# Patient Record
Sex: Female | Born: 1935 | Race: White | Hispanic: No | Smoking: Former smoker
Health system: Southern US, Community
[De-identification: ages and names within clinical notes are randomized; demographics above are authoritative.]

## PROBLEM LIST (undated history)

## (undated) DIAGNOSIS — K219 Gastro-esophageal reflux disease without esophagitis: Secondary | ICD-10-CM

## (undated) DIAGNOSIS — D494 Neoplasm of unspecified behavior of bladder: Secondary | ICD-10-CM

## (undated) DIAGNOSIS — R0789 Other chest pain: Secondary | ICD-10-CM

## (undated) DIAGNOSIS — R011 Cardiac murmur, unspecified: Secondary | ICD-10-CM

## (undated) DIAGNOSIS — I10 Essential (primary) hypertension: Secondary | ICD-10-CM

## (undated) DIAGNOSIS — H906 Mixed conductive and sensorineural hearing loss, bilateral: Secondary | ICD-10-CM

## (undated) DIAGNOSIS — I679 Cerebrovascular disease, unspecified: Secondary | ICD-10-CM

## (undated) DIAGNOSIS — E039 Hypothyroidism, unspecified: Secondary | ICD-10-CM

## (undated) DIAGNOSIS — J320 Chronic maxillary sinusitis: Secondary | ICD-10-CM

## (undated) DIAGNOSIS — I4891 Unspecified atrial fibrillation: Secondary | ICD-10-CM

## (undated) DIAGNOSIS — R110 Nausea: Secondary | ICD-10-CM

## (undated) DIAGNOSIS — C679 Malignant neoplasm of bladder, unspecified: Secondary | ICD-10-CM

## (undated) DIAGNOSIS — K589 Irritable bowel syndrome without diarrhea: Secondary | ICD-10-CM

## (undated) DIAGNOSIS — I350 Nonrheumatic aortic (valve) stenosis: Secondary | ICD-10-CM

## (undated) DIAGNOSIS — M129 Arthropathy, unspecified: Secondary | ICD-10-CM

## (undated) DIAGNOSIS — M1710 Unilateral primary osteoarthritis, unspecified knee: Secondary | ICD-10-CM

## (undated) DIAGNOSIS — R42 Dizziness and giddiness: Secondary | ICD-10-CM

## (undated) HISTORY — DX: Nonrheumatic aortic (valve) stenosis: I35.0

## (undated) HISTORY — DX: Mixed conductive and sensorineural hearing loss, bilateral: H90.6

## (undated) HISTORY — PX: EYE SURGERY: SHX253

## (undated) HISTORY — DX: Essential (primary) hypertension: I10

## (undated) HISTORY — PX: TONSILLECTOMY AND ADENOIDECTOMY: SUR1326

## (undated) HISTORY — DX: Chronic maxillary sinusitis: J32.0

## (undated) HISTORY — DX: Neoplasm of unspecified behavior of bladder: D49.4

## (undated) HISTORY — DX: Cardiac murmur, unspecified: R01.1

## (undated) HISTORY — DX: Unilateral primary osteoarthritis, unspecified knee: M17.10

## (undated) HISTORY — PX: BUNIONECTOMY: SHX129

## (undated) HISTORY — DX: Malignant neoplasm of bladder, unspecified: C67.9

## (undated) HISTORY — DX: Cerebrovascular disease, unspecified: I67.9

## (undated) HISTORY — PX: BLADDER TUMOR EXCISION: SHX238

## (undated) HISTORY — PX: CATARACT EXTRACTION: SUR2

## (undated) HISTORY — DX: Hypothyroidism, unspecified: E03.9

## (undated) HISTORY — DX: Arthropathy, unspecified: M12.9

## (undated) HISTORY — PX: BELPHAROPTOSIS REPAIR: SHX369

## (undated) HISTORY — DX: Irritable bowel syndrome without diarrhea: K58.9

## (undated) HISTORY — DX: Nausea: R11.0

## (undated) HISTORY — DX: Dizziness and giddiness: R42

## (undated) HISTORY — DX: Other chest pain: R07.89

---

## 1997-12-24 ENCOUNTER — Other Ambulatory Visit: Admission: RE | Admit: 1997-12-24 | Discharge: 1997-12-24 | Payer: Self-pay | Admitting: Obstetrics and Gynecology

## 1999-07-17 ENCOUNTER — Other Ambulatory Visit: Admission: RE | Admit: 1999-07-17 | Discharge: 1999-07-17 | Payer: Self-pay | Admitting: Obstetrics and Gynecology

## 2000-11-04 ENCOUNTER — Other Ambulatory Visit: Admission: RE | Admit: 2000-11-04 | Discharge: 2000-11-04 | Payer: Self-pay | Admitting: Obstetrics and Gynecology

## 2001-11-14 ENCOUNTER — Other Ambulatory Visit: Admission: RE | Admit: 2001-11-14 | Discharge: 2001-11-14 | Payer: Self-pay | Admitting: Obstetrics and Gynecology

## 2002-11-16 ENCOUNTER — Other Ambulatory Visit: Admission: RE | Admit: 2002-11-16 | Discharge: 2002-11-16 | Payer: Self-pay | Admitting: Obstetrics and Gynecology

## 2004-11-29 ENCOUNTER — Encounter: Payer: Self-pay | Admitting: Internal Medicine

## 2005-03-21 ENCOUNTER — Ambulatory Visit: Payer: Self-pay | Admitting: Internal Medicine

## 2005-06-27 ENCOUNTER — Ambulatory Visit: Payer: Self-pay | Admitting: Internal Medicine

## 2005-08-03 ENCOUNTER — Ambulatory Visit: Payer: Self-pay | Admitting: Internal Medicine

## 2005-11-26 ENCOUNTER — Ambulatory Visit: Payer: Self-pay | Admitting: Internal Medicine

## 2005-11-28 ENCOUNTER — Ambulatory Visit: Payer: Self-pay | Admitting: Internal Medicine

## 2006-03-01 ENCOUNTER — Ambulatory Visit: Payer: Self-pay | Admitting: Internal Medicine

## 2006-03-13 ENCOUNTER — Ambulatory Visit: Payer: Self-pay | Admitting: Internal Medicine

## 2006-03-25 ENCOUNTER — Ambulatory Visit: Payer: Self-pay | Admitting: Internal Medicine

## 2006-06-21 ENCOUNTER — Ambulatory Visit: Payer: Self-pay | Admitting: Internal Medicine

## 2006-08-08 ENCOUNTER — Encounter (INDEPENDENT_AMBULATORY_CARE_PROVIDER_SITE_OTHER): Payer: Self-pay | Admitting: *Deleted

## 2006-08-08 ENCOUNTER — Ambulatory Visit (HOSPITAL_BASED_OUTPATIENT_CLINIC_OR_DEPARTMENT_OTHER): Admission: RE | Admit: 2006-08-08 | Discharge: 2006-08-08 | Payer: Self-pay | Admitting: Urology

## 2006-08-16 ENCOUNTER — Ambulatory Visit: Payer: Self-pay | Admitting: Internal Medicine

## 2006-08-21 ENCOUNTER — Ambulatory Visit: Payer: Self-pay | Admitting: Internal Medicine

## 2006-08-28 ENCOUNTER — Ambulatory Visit: Payer: Self-pay | Admitting: Internal Medicine

## 2006-09-04 ENCOUNTER — Ambulatory Visit: Payer: Self-pay | Admitting: Internal Medicine

## 2006-10-10 ENCOUNTER — Ambulatory Visit: Payer: Self-pay | Admitting: Internal Medicine

## 2006-11-11 ENCOUNTER — Ambulatory Visit (HOSPITAL_COMMUNITY): Admission: RE | Admit: 2006-11-11 | Discharge: 2006-11-11 | Payer: Self-pay | Admitting: Urology

## 2007-01-07 DIAGNOSIS — I10 Essential (primary) hypertension: Secondary | ICD-10-CM

## 2007-01-07 DIAGNOSIS — E039 Hypothyroidism, unspecified: Secondary | ICD-10-CM

## 2007-01-07 HISTORY — DX: Essential (primary) hypertension: I10

## 2007-01-07 HISTORY — DX: Hypothyroidism, unspecified: E03.9

## 2007-04-25 ENCOUNTER — Ambulatory Visit: Payer: Self-pay | Admitting: Internal Medicine

## 2007-04-25 DIAGNOSIS — IMO0002 Reserved for concepts with insufficient information to code with codable children: Secondary | ICD-10-CM

## 2007-04-25 DIAGNOSIS — M1710 Unilateral primary osteoarthritis, unspecified knee: Secondary | ICD-10-CM

## 2007-04-25 HISTORY — DX: Reserved for concepts with insufficient information to code with codable children: IMO0002

## 2007-04-25 LAB — CONVERTED CEMR LAB
BUN: 20 mg/dL (ref 6–23)
CO2: 32 meq/L (ref 19–32)
Calcium: 9.8 mg/dL (ref 8.4–10.5)
Chloride: 106 meq/L (ref 96–112)
Creatinine, Ser: 0.9 mg/dL (ref 0.4–1.2)
GFR calc Af Amer: 76 mL/min
GFR calc non Af Amer: 63 mL/min
Glucose, Bld: 91 mg/dL (ref 70–99)
Potassium: 4 meq/L (ref 3.5–5.1)
Sodium: 144 meq/L (ref 135–145)
TSH: 0.12 microintl units/mL — ABNORMAL LOW (ref 0.35–5.50)

## 2007-10-24 ENCOUNTER — Ambulatory Visit: Payer: Self-pay | Admitting: Internal Medicine

## 2007-10-24 DIAGNOSIS — C679 Malignant neoplasm of bladder, unspecified: Secondary | ICD-10-CM | POA: Insufficient documentation

## 2007-10-24 HISTORY — DX: Malignant neoplasm of bladder, unspecified: C67.9

## 2007-10-27 LAB — CONVERTED CEMR LAB
BUN: 25 mg/dL — ABNORMAL HIGH (ref 6–23)
CO2: 35 meq/L — ABNORMAL HIGH (ref 19–32)
Chloride: 100 meq/L (ref 96–112)
GFR calc Af Amer: 76 mL/min
Sodium: 141 meq/L (ref 135–145)
TSH: 0.22 microintl units/mL — ABNORMAL LOW (ref 0.35–5.50)

## 2007-12-04 ENCOUNTER — Encounter: Payer: Self-pay | Admitting: Internal Medicine

## 2007-12-12 ENCOUNTER — Encounter: Payer: Self-pay | Admitting: Internal Medicine

## 2008-03-22 ENCOUNTER — Encounter: Payer: Self-pay | Admitting: Internal Medicine

## 2008-05-03 ENCOUNTER — Ambulatory Visit: Payer: Self-pay | Admitting: Internal Medicine

## 2008-05-03 DIAGNOSIS — K589 Irritable bowel syndrome without diarrhea: Secondary | ICD-10-CM

## 2008-05-03 DIAGNOSIS — K582 Mixed irritable bowel syndrome: Secondary | ICD-10-CM

## 2008-05-03 HISTORY — DX: Irritable bowel syndrome, unspecified: K58.9

## 2008-05-05 LAB — CONVERTED CEMR LAB
BUN: 23 mg/dL (ref 6–23)
CO2: 30 meq/L (ref 19–32)
GFR calc Af Amer: 67 mL/min
GFR calc non Af Amer: 56 mL/min
Glucose, Bld: 84 mg/dL (ref 70–99)

## 2008-06-02 ENCOUNTER — Ambulatory Visit: Payer: Self-pay | Admitting: Family Medicine

## 2008-06-09 ENCOUNTER — Ambulatory Visit: Payer: Self-pay | Admitting: Internal Medicine

## 2008-06-21 ENCOUNTER — Encounter: Payer: Self-pay | Admitting: Internal Medicine

## 2008-06-23 ENCOUNTER — Ambulatory Visit: Payer: Self-pay | Admitting: Internal Medicine

## 2008-06-25 ENCOUNTER — Telehealth: Payer: Self-pay | Admitting: Internal Medicine

## 2008-06-29 ENCOUNTER — Ambulatory Visit: Payer: Self-pay | Admitting: Internal Medicine

## 2008-07-07 ENCOUNTER — Ambulatory Visit: Payer: Self-pay | Admitting: Internal Medicine

## 2008-07-19 ENCOUNTER — Ambulatory Visit: Payer: Self-pay | Admitting: Internal Medicine

## 2008-08-16 ENCOUNTER — Ambulatory Visit: Payer: Self-pay | Admitting: Internal Medicine

## 2008-08-18 ENCOUNTER — Encounter: Payer: Self-pay | Admitting: Internal Medicine

## 2008-08-18 ENCOUNTER — Ambulatory Visit: Payer: Self-pay

## 2008-09-15 ENCOUNTER — Ambulatory Visit: Payer: Self-pay | Admitting: Internal Medicine

## 2008-09-17 ENCOUNTER — Ambulatory Visit: Payer: Self-pay | Admitting: Gastroenterology

## 2008-09-17 LAB — CONVERTED CEMR LAB: Tissue Transglutaminase Ab, IgA: 0.3 units (ref <7)

## 2008-09-20 ENCOUNTER — Encounter: Payer: Self-pay | Admitting: Gastroenterology

## 2008-09-21 LAB — CONVERTED CEMR LAB
Alkaline Phosphatase: 59 units/L (ref 39–117)
BUN: 29 mg/dL — ABNORMAL HIGH (ref 6–23)
Basophils Absolute: 0 10*3/uL (ref 0.0–0.1)
Basophils Relative: 0.2 % (ref 0.0–3.0)
CO2: 29 meq/L (ref 19–32)
GFR calc Af Amer: 76 mL/min
HCT: 36.2 % (ref 36.0–46.0)
MCV: 88.5 fL (ref 78.0–100.0)
Monocytes Absolute: 0.9 10*3/uL (ref 0.1–1.0)
Monocytes Relative: 6.2 % (ref 3.0–12.0)
Neutrophils Relative %: 79.2 % — ABNORMAL HIGH (ref 43.0–77.0)
RBC: 4.08 M/uL (ref 3.87–5.11)
Sed Rate: 29 mm/hr — ABNORMAL HIGH (ref 0–22)
Sodium: 140 meq/L (ref 135–145)
Total Protein: 6.4 g/dL (ref 6.0–8.3)
WBC: 14.3 10*3/uL — ABNORMAL HIGH (ref 4.5–10.5)

## 2008-11-01 ENCOUNTER — Ambulatory Visit: Payer: Self-pay | Admitting: Internal Medicine

## 2008-11-02 ENCOUNTER — Ambulatory Visit: Payer: Self-pay | Admitting: Gastroenterology

## 2008-11-03 LAB — CONVERTED CEMR LAB
BUN: 21 mg/dL (ref 6–23)
CO2: 35 meq/L — ABNORMAL HIGH (ref 19–32)
Calcium: 9.6 mg/dL (ref 8.4–10.5)
Creatinine, Ser: 0.9 mg/dL (ref 0.4–1.2)
GFR calc non Af Amer: 62.86 mL/min (ref >60)
Potassium: 3.6 meq/L (ref 3.5–5.1)

## 2008-12-22 ENCOUNTER — Encounter: Payer: Self-pay | Admitting: Internal Medicine

## 2009-05-02 ENCOUNTER — Ambulatory Visit: Payer: Self-pay | Admitting: Internal Medicine

## 2009-05-04 ENCOUNTER — Encounter: Payer: Self-pay | Admitting: Internal Medicine

## 2009-05-04 LAB — CONVERTED CEMR LAB
BUN: 22 mg/dL (ref 6–23)
GFR calc non Af Amer: 71.93 mL/min (ref >60)
Glucose, Bld: 92 mg/dL (ref 70–99)
TSH: 0.53 microintl units/mL (ref 0.35–5.50)

## 2009-06-20 ENCOUNTER — Ambulatory Visit: Payer: Self-pay | Admitting: Internal Medicine

## 2009-08-05 ENCOUNTER — Encounter: Payer: Self-pay | Admitting: Internal Medicine

## 2009-09-28 ENCOUNTER — Encounter: Payer: Self-pay | Admitting: Internal Medicine

## 2009-09-28 DIAGNOSIS — H906 Mixed conductive and sensorineural hearing loss, bilateral: Secondary | ICD-10-CM | POA: Insufficient documentation

## 2009-09-28 HISTORY — DX: Mixed conductive and sensorineural hearing loss, bilateral: H90.6

## 2009-10-26 ENCOUNTER — Ambulatory Visit: Payer: Self-pay | Admitting: Internal Medicine

## 2009-10-26 DIAGNOSIS — M129 Arthropathy, unspecified: Secondary | ICD-10-CM

## 2009-10-26 HISTORY — DX: Arthropathy, unspecified: M12.9

## 2009-10-27 LAB — CONVERTED CEMR LAB
BUN: 17 mg/dL (ref 6–23)
CO2: 33 meq/L — ABNORMAL HIGH (ref 19–32)
Chloride: 102 meq/L (ref 96–112)
Creatinine, Ser: 0.8 mg/dL (ref 0.4–1.2)
Potassium: 4 meq/L (ref 3.5–5.1)
TSH: 0.44 microintl units/mL (ref 0.35–5.50)

## 2009-11-04 ENCOUNTER — Telehealth: Payer: Self-pay | Admitting: Internal Medicine

## 2009-11-04 ENCOUNTER — Encounter: Payer: Self-pay | Admitting: Internal Medicine

## 2009-11-18 ENCOUNTER — Ambulatory Visit: Payer: Self-pay | Admitting: Gastroenterology

## 2009-11-21 LAB — CONVERTED CEMR LAB
Basophils Relative: 0.4 % (ref 0.0–3.0)
Eosinophils Relative: 0.9 % (ref 0.0–5.0)
Lymphocytes Relative: 40 % (ref 12.0–46.0)
MCV: 90.3 fL (ref 78.0–100.0)
Monocytes Relative: 8.4 % (ref 3.0–12.0)
Neutro Abs: 4.3 10*3/uL (ref 1.4–7.7)
Neutrophils Relative %: 50.3 % (ref 43.0–77.0)
RBC: 3.84 M/uL — ABNORMAL LOW (ref 3.87–5.11)
WBC: 8.5 10*3/uL (ref 4.5–10.5)

## 2009-12-08 ENCOUNTER — Telehealth: Payer: Self-pay | Admitting: Internal Medicine

## 2010-03-14 ENCOUNTER — Telehealth: Payer: Self-pay | Admitting: Gastroenterology

## 2010-03-23 ENCOUNTER — Telehealth: Payer: Self-pay | Admitting: Internal Medicine

## 2010-03-24 ENCOUNTER — Encounter: Payer: Self-pay | Admitting: Internal Medicine

## 2010-04-11 ENCOUNTER — Encounter: Payer: Self-pay | Admitting: Internal Medicine

## 2010-04-26 ENCOUNTER — Ambulatory Visit: Payer: Self-pay | Admitting: Internal Medicine

## 2010-05-09 ENCOUNTER — Ambulatory Visit: Payer: Self-pay | Admitting: Internal Medicine

## 2010-05-09 DIAGNOSIS — M549 Dorsalgia, unspecified: Secondary | ICD-10-CM | POA: Insufficient documentation

## 2010-05-11 ENCOUNTER — Telehealth: Payer: Self-pay | Admitting: Internal Medicine

## 2010-07-11 ENCOUNTER — Telehealth: Payer: Self-pay | Admitting: Internal Medicine

## 2010-07-18 ENCOUNTER — Ambulatory Visit
Admission: RE | Admit: 2010-07-18 | Discharge: 2010-07-18 | Payer: Self-pay | Source: Home / Self Care | Attending: Gastroenterology | Admitting: Gastroenterology

## 2010-07-24 ENCOUNTER — Encounter: Payer: Self-pay | Admitting: Internal Medicine

## 2010-08-08 NOTE — Letter (Signed)
Summary: [facility]  [facility]   Imported By: Maryln Gottron 08/11/2009 14:29:32  _____________________________________________________________________  External Attachment:    Type:   Image     Comment:   External Document

## 2010-08-08 NOTE — Progress Notes (Signed)
Summary: IBS meds   Phone Note Call from Patient Call back at Home Phone [phone]   Caller: Patient Call For: Dr. Christella Hartigan Reason for Call: Talk to Nurse Summary of Call: pt having eye surgery on the 15th and says she will need stronger medication for IBS on that particular day Initial call taken by: Vallarie Mare,  March 14, 2010 10:03 AM  Follow-up for Phone Call        pt was advised to call her Eye surgeon and make them aware that she is taking Immodium and see if it is ok for her to take the Immodium before bed the night before and if it is ok for her to continue taking it after.  She will call and make them aware that she does take it. Follow-up by: Chales Abrahams CMA Duncan Dull),  March 14, 2010 10:14 AM  Additional Follow-up for Phone Call Additional follow up Details #1::        ok Additional Follow-up by: Rachael Fee MD,  March 14, 2010 10:17 AM

## 2010-08-08 NOTE — Progress Notes (Signed)
Summary: foot swelling  Phone Note Call from Patient Call back at Home Phone [phone]   Caller: Patient--live Call For: Birdie Sons MD Summary of Call: change of BP med last visit.  BP 4/25 124/72, 4/29 138/66--noted feet swollen last night after dinner; R>L with ankle not showing  Told to continue to monitor and call back if swelling returns. Initial call taken by: Gladis Riffle, RN,  November 04, 2009 10:01 AM  Follow-up for Phone Call        dr Karyssa Amaral agreed. Follow-up by: Gladis Riffle, RN,  November 04, 2009 10:01 AM

## 2010-08-08 NOTE — Miscellaneous (Signed)
Summary: Orders Update   Clinical Lists Changes  Problems: Added new problem of MIXED HEARING LOSS BILATERAL (ICD-389.22) Orders: Added new Referral order of Misc. Referral (Misc. Ref) - Signed

## 2010-08-08 NOTE — Assessment & Plan Note (Signed)
Summary: 6 month rov/njr   Vital Signs:  Patient profile:   77 year old female Weight:      174 pounds BMI:     31.94 Temp:     98.6 degrees F oral Pulse rhythm:   regular Resp:     16 per minute BP sitting:   140 / 76  Vitals Entered By: Lynann Beaver CMA (April 26, 2010 10:53 AM) CC: rov Is Patient Diabetic? No Pain Assessment Patient in pain? no        Primary Care Yordy Matton:  Birdie Sons, MD  CC:  rov.  History of Present Illness:  Follow-Up Visit      This is an 77 year old woman who presents for Follow-up visit.  The patient denies chest pain and palpitations.  Since the last visit the patient notes no new problems or concerns and being seen by a specialist.  The patient reports taking meds as prescribed and not monitoring BP.  When questioned about possible medication side effects, the patient notes none.   Seeing dr Christella Hartigan for chronic diarrhea  All other systems reviewed and were negative except for chronic knee pain  Current Problems (verified): 1)  Unspecified Arthropathy Multiple Sites  (ICD-716.99) 2)  Mixed Hearing Loss Bilateral  (ICD-389.22) 3)  Ibs  (ICD-564.1) 4)  Carcinoma, Bladder, Transitional Cell  (ICD-188.9) 5)  Osteoarthritis, Knee  (ICD-715.96) 6)  Hypertension  (ICD-401.9) 7)  Hypothyroidism  (ICD-244.9)  Current Medications (verified): 1)  Multivitamins   Tabs (Multiple Vitamin) .... One By Mouth Borchardt 2)  Tylenol Extra Strength 500 Mg  Tabs (Acetaminophen) .... 2  By Mouth Diven Plus One At Bedtime 3)  Levothyroxine Sodium 75 Mcg  Tabs (Levothyroxine Sodium) .Marland Kitchen.. 1 By Mouth Boda 4)  Carvedilol 12.5 Mg  Tabs (Carvedilol) .Marland Kitchen.. 1 Pill By Mouth 2 Times Cornelio 5)  Imodium A-D 2 Mg Tabs (Loperamide Hcl) .... Two Every Day 6)  Tums 500 Mg Chew (Calcium Carbonate Antacid) .... As Needed  Allergies (verified): 1)  ! Hydrocodone 2)  ! Ace Inhibitors  Past History:  Past Medical History: Last updated: 09/17/2008 Hypothyroidism Bladder  CA--wrenn Melanoma Hypertension   Past Surgical History: Last updated: 09/17/2008 Cataract extraction-bilat   Family History: Last updated: 09/17/2008 Family History Other cancer-Stomach CA Fam hx kidney failure daughter with crohn's  Social History: Last updated: 09/17/2008 Retired Former Smoker Alcohol use-yes-rare no caffeine  Risk Factors: Smoking Status: quit > 6 months (10/26/2009)  Physical Exam  General:  Well-developed,well-nourished,in no acute distress; alert,appropriate and cooperative throughout examination Head:  normocephalic and atraumatic.   Eyes:  pupils equal and pupils round.   Ears:  R ear normal and L ear normal.   Neck:  No deformities, masses, or tenderness noted. Skin:  turgor normal and color normal.   Psych:  good eye contact and not anxious appearing.     Impression & Recommendations:  Problem # 1:  HYPERTENSION (ICD-401.9) adequatecontrol continue current medications  Her updated medication list for this problem includes:    Carvedilol 12.5 Mg Tabs (Carvedilol) .Marland Kitchen... 1 pill by mouth 2 times Hapke  Problem # 2:  IBS (ICD-564.1) uses loperamide as needed with good results  Problem # 3:  OSTEOARTHRITIS, KNEE (ICD-715.96) sees dr Despina Hick q 4 months (steroid injections) Her updated medication list for this problem includes:    Tylenol Extra Strength 500 Mg Tabs (Acetaminophen) .Marland Kitchen... 2  by mouth Cutshaw plus one at bedtime  Complete Medication List: 1)  Multivitamins Tabs (Multiple vitamin) .Marland KitchenMarland KitchenMarland Kitchen  One by mouth Sundeen 2)  Tylenol Extra Strength 500 Mg Tabs (Acetaminophen) .... 2  by mouth Kil plus one at bedtime 3)  Levothyroxine Sodium 75 Mcg Tabs (Levothyroxine sodium) .Marland Kitchen.. 1 by mouth Weider 4)  Carvedilol 12.5 Mg Tabs (Carvedilol) .Marland Kitchen.. 1 pill by mouth 2 times Mastel 5)  Imodium A-d 2 Mg Tabs (Loperamide hcl) .... Two every day 6)  Tums 500 Mg Chew (Calcium carbonate antacid) .... As needed  Preventive Care Screening  Last Flu Shot:     Date:  04/08/2010    Results:  given    Contraindications/Deferment of Procedures/Staging:    Test/Procedure: PAP Smear    Reason for deferment: not indicated     Test/Procedure: Mammogram    Reason for deferment: not indicated     Test/Procedure: Colonoscopy    Reason for deferment: not indicated   Patient Instructions: 1)  Please schedule a follow-up appointment in 6 months.   Orders Added: 1)  Est. Patient Level IV [16109]

## 2010-08-08 NOTE — Miscellaneous (Signed)
Summary: Standing Orders/[facility]  [facility]ing [facility]   Imported By: Maryln Gottron 03/29/2010 14:16:56  _____________________________________________________________________  External Attachment:    Type:   Image     Comment:   External Document

## 2010-08-08 NOTE — Assessment & Plan Note (Signed)
Summary: BACK PAIN // RS   Vital Signs:  Patient profile:   77 year old female Temp:     98.5 degrees F oral BP sitting:   172 / 84  (left arm) Cuff size:   regular  Vitals Entered By: Alfred Levins, CMA (May 09, 2010 11:56 AM) CC: back pain x2 days Pain Assessment Patient in pain? yes     Location: lower back Intensity: 9 Type: sharp Onset of pain  worsen last night   Primary Care Provider:  Birdie Sons, MD  CC:  back pain x2 days.  History of Present Illness: 2 day hx of back pain started gradually has progressed pain worse when she is transitioning positions.  pain ok when sitting/lying/walking as long as she doesn't try to twist/ bend   Allergies: 1)  ! Hydrocodone 2)  ! Ace Inhibitors  Past History:  Past Medical History: Last updated: 09/17/2008 Hypothyroidism Bladder CA--wrenn Melanoma Hypertension   Past Surgical History: Last updated: 09/17/2008 Cataract extraction-bilat   Family History: Last updated: 09/17/2008 Family History Other cancer-Stomach CA Fam hx kidney failure daughter with crohn's  Social History: Last updated: 09/17/2008 Retired Former Smoker Alcohol use-yes-rare no caffeine  Risk Factors: Smoking Status: quit > 6 months (10/26/2009)  Review of Systems  The patient denies anorexia and fever.    Physical Exam  General:  elderly female in no acute distress. Walking with a cane. Neck is supple. Chest clear to auscultation cardiac exam S1-S2 are regular. Extremities she does have present and symmetric reflexes at the knee.   Impression & Recommendations:  Problem # 1:  BACK PAIN (ICD-724.5) suspect muscle spasm/mechanical back pain call if worsens Her updated medication list for this problem includes:    Tylenol Extra Strength 500 Mg Tabs (Acetaminophen) .Marland Kitchen... 2  by mouth Hunton plus one at bedtime    Methocarbamol 500 Mg Tabs (Methocarbamol) .Marland Kitchen... Take 1 tablet by mouth three times a day for back  pain  Complete Medication List: 1)  Multivitamins Tabs (Multiple vitamin) .... One by mouth Brandow 2)  Tylenol Extra Strength 500 Mg Tabs (Acetaminophen) .... 2  by mouth Ozanich plus one at bedtime 3)  Levothyroxine Sodium 75 Mcg Tabs (Levothyroxine sodium) .Marland Kitchen.. 1 by mouth Potvin 4)  Carvedilol 12.5 Mg Tabs (Carvedilol) .Marland Kitchen.. 1 pill by mouth 2 times Toohey 5)  Imodium A-d 2 Mg Tabs (Loperamide hcl) .... Two every day 6)  Tums 500 Mg Chew (Calcium carbonate antacid) .... As needed 7)  Methocarbamol 500 Mg Tabs (Methocarbamol) .... Take 1 tablet by mouth three times a day for back pain  Patient Instructions: 1)  . Prescriptions: METHOCARBAMOL 500 MG TABS (METHOCARBAMOL) Take 1 tablet by mouth three times a day for back pain  #40 x 0   Entered and Authorized by:   Birdie Sons MD   Signed by:   Birdie Sons MD on 05/09/2010   Method used:   Electronically to        [facility] [phone]* (retail)       [address]       [place], [place]  [address]       Ph: [phone] or [phone]       Fax: [phone]   RxID:   [id number]    Orders Added: 1)  Est. Patient Level III [36644]

## 2010-08-08 NOTE — Assessment & Plan Note (Signed)
Summary: ROA X 6 MTHS / RS   Vital Signs:  Patient profile:   77 year old female Height:      62 inches Weight:      176 pounds Temp:     98.5 degrees F oral Pulse rate:   60 / minute Pulse rhythm:   regular Resp:     12 per minute BP sitting:   100 / 58  (left arm) Cuff size:   regular  Vitals Entered By: Gladis Riffle, RN (October 26, 2009 11:00 AM) CC: 6 month rov--c/o arthritis pain Is Patient Diabetic? No   Primary Care Provider:  Birdie Sons, MD  CC:  6 month rov--c/o arthritis pain.  History of Present Illness: she complains of more diffuse arthritis. Long term OA of knees she now complains of pain of shoulders, elbows hands. some joint swelling of hands  hyppothyroid: needs followup  htn---tolerating meds without difficulty  All other systems reviewed and were negative   Preventive Screening-Counseling & Management  Alcohol-Tobacco     Smoking Status: quit > 6 months     Year Started: 1944     Year Quit: 1955  Current Problems (verified): 1)  Mixed Hearing Loss Bilateral  (ICD-389.22) 2)  Ibs  (ICD-564.1) 3)  Carcinoma, Bladder, Transitional Cell  (ICD-188.9) 4)  Osteoarthritis, Knee  (ICD-715.96) 5)  Hypertension  (ICD-401.9) 6)  Hypothyroidism  (ICD-244.9)  Current Medications (verified): 1)  Multivitamins   Tabs (Multiple Vitamin) .... One By Mouth Whittlesey 2)  Tylenol Extra Strength 500 Mg  Tabs (Acetaminophen) .... One By Mouth Zenker Plus One At Bedtime 3)  Levothyroxine Sodium 75 Mcg  Tabs (Levothyroxine Sodium) .Marland Kitchen.. 1 By Mouth Williamson 4)  Carvedilol 12.5 Mg  Tabs (Carvedilol) .Marland Kitchen.. 1 Pill By Mouth 2 Times Heymann 5)  Imodium A-D 2 Mg Tabs (Loperamide Hcl) .... Two Every Day 6)  Hydrochlorothiazide 25 Mg  Tabs (Hydrochlorothiazide) .... Take 1 Tab By Mouth Every Morning 7)  Tums 500 Mg Chew (Calcium Carbonate Antacid) .... As Needed 8)  Klor-Con M20 20 Meq Cr-Tabs (Potassium Chloride Crys Cr) .... Take One Tab Once Slotnick  Allergies: 1)  ! Hydrocodone 2)   ! Ace Inhibitors  Past History:  Past Medical History: Last updated: 09/17/2008 Hypothyroidism Bladder CA--wrenn Melanoma Hypertension   Past Surgical History: Last updated: 09/17/2008 Cataract extraction-bilat   Family History: Last updated: 09/17/2008 Family History Other cancer-Stomach CA Fam hx kidney failure daughter with crohn's  Social History: Last updated: 09/17/2008 Retired Former Smoker Alcohol use-yes-rare no caffeine  Risk Factors: Smoking Status: quit > 6 months (10/26/2009)  Physical Exam  General:  Well-developed,well-nourished,in no acute distress; alert,appropriate and cooperative throughout examination Head:  normocephalic and atraumatic.   Eyes:  pupils equal and pupils round.   Ears:  R ear normal and L ear normal.   Neck:  No deformities, masses, or tenderness noted. Lungs:  normal respiratory effort and no intercostal retractions.  few rhonchi bilaterally Heart:  Normal rate and regular rhythm. S1 and S2 normal without gallop, murmur, click, rub or other extra sounds. Abdomen:  Bowel sounds positive,abdomen soft and non-tender without masses, organomegaly or hernias noted. overweight Msk:  swollwn tender rt knee Neurologic:  alert & oriented X3, cranial nerves II-XII intact, and gait normal.     Impression & Recommendations:  Problem # 1:  HYPERTENSION (ICD-401.9)  discussed i doubt the hctz is doing much in this situation stop hctz and potassium The following medications were removed from the medication list:  Hydrochlorothiazide 25 Mg Tabs (Hydrochlorothiazide) .Marland Kitchen... Take 1 tab by mouth every morning Her updated medication list for this problem includes:    Carvedilol 12.5 Mg Tabs (Carvedilol) .Marland Kitchen... 1 pill by mouth 2 times Prestridge  BP today: 100/58 Prior BP: 108/60 (06/20/2009)  Labs Reviewed: K+: 3.2 (05/02/2009) Creat: : 0.8 (05/02/2009)     Orders: Venipuncture (04540) TLB-BMP (Basic Metabolic Panel-BMET)  (80048-METABOL)  Complete Medication List: 1)  Multivitamins Tabs (Multiple vitamin) .... One by mouth Bouchie 2)  Tylenol Extra Strength 500 Mg Tabs (Acetaminophen) .... One by mouth Haecker plus one at bedtime 3)  Levothyroxine Sodium 75 Mcg Tabs (Levothyroxine sodium) .Marland Kitchen.. 1 by mouth Fenley 4)  Carvedilol 12.5 Mg Tabs (Carvedilol) .Marland Kitchen.. 1 pill by mouth 2 times Runkel 5)  Imodium A-d 2 Mg Tabs (Loperamide hcl) .... Two every day 6)  Tums 500 Mg Chew (Calcium carbonate antacid) .... As needed  Other Orders: TLB-TSH (Thyroid Stimulating Hormone) (84443-TSH) TLB-Sedimentation Rate (ESR) (85652-ESR) Gastroenterology Referral (GI)  Patient Instructions: 1)  monitor your blood pressure weekly 2)  call [facility] in 6 weeks to report "numbers" 3)  Please schedule a follow-up appointment in 6 months.

## 2010-08-08 NOTE — Progress Notes (Signed)
Summary: BP readings  Phone Note Call from Patient Call back at Home Phone [phone]   Caller: Patient live Call For: Birdie Sons MD Summary of Call: was to call in BP readings of last 6 weeks since BP med change.  Range was 110/60 on 5/23-138/76 on 5/5.   Last week was 130/58 on 5/20-120/62 on 6/2. Initial call taken by: Gladis Riffle, RN,  December 08, 2009 10:57 AM  Follow-up for Phone Call        looks good. stay on same meds Follow-up by: Birdie Sons MD,  December 08, 2009 12:14 PM  Additional Follow-up for Phone Call Additional follow up Details #1::        Patient notified.  Additional Follow-up by: Gladis Riffle, RN,  December 08, 2009 12:19 PM

## 2010-08-08 NOTE — Progress Notes (Signed)
Summary: [facility]   Darl Pikes  Phone Note From [employer] back at [employer] [phone] Darl Pikes   Caller: [facility]   Darl Pikes Summary of Call: Pt just has eyelid surgery and is needing to get an order for a continue stay for 24hr -48 hr stay. The pt is req this and Dr. Dimas Millin declined the order for pt to stay at [facility]. Pt is req our help with eardrops and ice packs. We also need a diet order, no added salt, an activity order, full code. Also need an order that say that it is ok for the pt to self medicate regular meds. [facility] with admin drops and narcotic.  Pls fax order to [phone] attn: Rehab Nurse.   Faxing standing orders.        Initial call taken by: Lucy Antigua,  March 23, 2010 2:36 PM  Follow-up for Phone Call        ok Follow-up by: Birdie Sons MD,  March 23, 2010 4:39 PM  Additional Follow-up for Phone Call Additional follow up Details #1::        [facility] notified. Additional Follow-up by: Lynann Beaver CMA,  March 24, 2010 12:33 PM

## 2010-08-08 NOTE — Progress Notes (Signed)
Summary: changes med?  Phone Note Call from Patient Call back at Home Phone [phone]   Caller: Patient-voicemail message Call For: swords Summary of Call: pt said the muscle relaxer has helped some but she is still having lt hip pain and it kept her awake last night Initial call taken by: Alfred Levins, CMA,  May 11, 2010 10:12 AM  Follow-up for Phone Call        per Dr Cato Mulligan call in vicodin 5/325 q 4 hours as needed #30, rx called in Follow-up by: Alfred Levins, CMA,  May 12, 2010 10:42 AM    New/Updated Medications: HYDROCODONE-ACETAMINOPHEN 5-325 MG TABS (HYDROCODONE-ACETAMINOPHEN) 1 by mouth every 4 hours as needed for pain Prescriptions: HYDROCODONE-ACETAMINOPHEN 5-325 MG TABS (HYDROCODONE-ACETAMINOPHEN) 1 by mouth every 4 hours as needed for pain  #30 x 0   Entered by:   Alfred Levins, CMA   Authorized by:   Birdie Sons MD   Signed by:   Alfred Levins, CMA on 05/12/2010   Method used:   Telephoned to ...       [facility] [phone]* (retail)       [address]       [place], [place]  [address]       Ph: [phone] or [phone]       Fax: [phone]   RxID:   [id number]  vicodin 5/325 q 4 hours as needed #30

## 2010-08-08 NOTE — Miscellaneous (Signed)
Summary: Physician's Orders/[facility]  Physician's Orders/[facility]   Imported By: Maryln Gottron 05/04/2010 11:16:24  _____________________________________________________________________  External Attachment:    Type:   Image     Comment:   External Document

## 2010-08-08 NOTE — Assessment & Plan Note (Signed)
Review of gastrointestinal problems: 1. Colonic adenomas. Colonoscopy 2006 by Dr. Vilinda Boehringer removed 3 subcentimeter adenomas.  Usual followup would be at 3 year interval however given her age we decided not to repeat colonoscopy as yet. She is also very uninterested in repeat colonoscopy.     History of Present Illness Visit Type: consult  Primary GI MD: Rob Bunting MD Primary Provider: Birdie Sons, MD Requesting Provider: Birdie Sons, MD Chief Complaint: BRB after BMs  History of Present Illness:     she always takes one immodium a day, sometimes 2 and on this regimin her bowels are under control.  She has had some rectal bleeding.  She had a hemorrhoid 55 years ago.  Also noted on colonoscopy at some point.  This has intermittently bled, expecially at times of diarrhrea.  she will see red rectal blood about 1 every 6-7 weeks.    she has not had cbc in a while, does not have sigs, symptoms of anemia.  saw an add in paper recently for "painless 5 min hemorrhoid treatment by Dr. Kinnie Scales," wonders if she should have this done.           Current Medications (verified): 1)  Multivitamins   Tabs (Multiple Vitamin) .... One By Mouth Lennox 2)  Tylenol Extra Strength 500 Mg  Tabs (Acetaminophen) .... 2  By Mouth Dottavio Plus One At Bedtime 3)  Levothyroxine Sodium 75 Mcg  Tabs (Levothyroxine Sodium) .Marland Kitchen.. 1 By Mouth Solberg 4)  Carvedilol 12.5 Mg  Tabs (Carvedilol) .Marland Kitchen.. 1 Pill By Mouth 2 Times Mc[place] 5)  Imodium A-D 2 Mg Tabs (Loperamide Hcl) .... Two Every Day 6)  Tums 500 Mg Chew (Calcium Carbonate Antacid) .... As Needed  Allergies (verified): 1)  ! Hydrocodone 2)  ! Ace Inhibitors  Vital Signs:  Patient profile:   77 year old female Height:      62 inches Weight:      179 pounds BMI:     32.86 BSA:     1.83 Pulse rate:   60 / minute Pulse rhythm:   regular BP sitting:   124 / 64  (left arm) Cuff size:   regular  Vitals Entered By: Ok Anis CMA (Nov 18, 2009 1:34  PM)  Physical Exam  Additional Exam:  Constitutional: generally well appearing Psychiatric: alert and oriented times 3 Abdomen: soft, non-tender, non-distended, normal bowel sounds Rectal exam with female assistant in room: 2-3 small to medium sized external hemorrhoids, none thrombosed, internal exam revealed brown Hemoccult negative stool, no rectalmasses palpated   Impression & Recommendations:  Problem # 1:  external hemorrhoids intermittent rectal bleeding I generally do not recommend hemorrhoidal therapies unless the hemorrhoids are causing persistent bothersome symptoms or significant anemia. She does not have any persistent bothersome symptoms and we will check her today to see if she is anemic if she is she and I will have to discuss whether her anemia is from this very intermittent minor rectal bleeding or not. If she does want to proceed with hemorrhoidal treatments probably Saturday [facility] [place] [facility] rather than Dr. Kinnie Scales ( his add depicting a totally painless, 5 minute procedure for hemorrhoids is a bit suspect to me)  Other Orders: TLB-CBC Platelet - w/Differential (85025-CBCD)  Patient Instructions: 1)  You will get lab test(s) done today (cbc).  If you have signficant anemia, would consider referral for hemorrhoid treatment. 2)  The medication list was reviewed and reconciled.  All changed / newly prescribed medications were explained.  A complete medication list was provided to the patient / caregiver.

## 2010-08-10 NOTE — Progress Notes (Signed)
Summary: Call-A-Nurse Report    Call-A-Nurse Triage Call Report Triage Record Num: [id number] Operator: Frederico Hamman Patient Name: Susan [name] Call Date & Time: 07/10/2010 2:40:47PM Patient Phone: [phone] PCP: Valetta Mole. Swords Patient Gender: Female PCP Fax : [phone] Patient DOB: 03/17/1919 Practice Name: [facility] Reason for Call: [name] states she was " exposed to shingles by Grandson who left on 12/28". Was told by grandson's MD to call in 07/10/10. Asking about antiviral meds. Dr. Posey Rea notified. No orders received. Will notify Dr. Cato Mulligan.Advised to callback if symptoms occur. Office note Protocol(s) Used: Office Note Recommended Outcome per Protocol: Information Noted and Sent to Office Reason for Outcome: Caller information to office Care Advice:  ~ 07/10/2010 3:03:28PM Page 1 of 1 [email or web address]

## 2010-08-10 NOTE — Assessment & Plan Note (Signed)
  Review of gastrointestinal problems: 1. Colonic adenomas. Colonoscopy 2006 by Dr. Vilinda Boehringer removed 3 subcentimeter adenomas.  Usual followup would be at 3 year interval however given her age we decided not to repeat colonoscopy as yet. She is also very uninterested in repeat colonoscopy.     History of Present Illness Visit Type: Follow-up Visit Primary GI MD: Rob Bunting MD Primary Provider: Birdie Sons, MD Requesting Provider: n/a Chief Complaint: 6 month follow up History of Present Illness:     very pleasant 77 year old woman whom I last saw7-8 months ago. She has mild, chronic diarrhea that is under good control on Imodium 2-3 times a day. She tried some of her friends Lomotil and it helped even better. She was asking for a prescription for her. She sees no overt GI bleeding.           Current Medications (verified): 1)  Multivitamins   Tabs (Multiple Vitamin) .... One By Mouth Rarick 2)  Tylenol Extra Strength 500 Mg  Tabs (Acetaminophen) .... 2  By Mouth Camus Plus One At Bedtime 3)  Levothyroxine Sodium 75 Mcg  Tabs (Levothyroxine Sodium) .Marland Kitchen.. 1 By Mouth Shontz 4)  Carvedilol 12.5 Mg  Tabs (Carvedilol) .Marland Kitchen.. 1 Pill By Mouth 2 Times Geurin 5)  Imodium A-D 2 Mg Tabs (Loperamide Hcl) .... Two Every Day 6)  Tums 500 Mg Chew (Calcium Carbonate Antacid) .... As Needed  Allergies (verified): 1)  ! Hydrocodone 2)  ! Ace Inhibitors  Vital Signs:  Patient profile:   77 year old female Height:      62 inches Weight:      175 pounds BMI:     32.12 BSA:     1.81 Pulse rate:   80 / minute Pulse rhythm:   irregular BP sitting:   100 / 60  (left arm)  Vitals Entered By: Merri Ray CMA (AAMA) (July 18, 2010 1:15 PM)  Physical Exam  Additional Exam:  Constitutional: generally well appearing Psychiatric: alert and oriented times 3 Abdomen: soft, non-tender, non-distended, normal bowel sounds    Impression & Recommendations:  Problem # 1:  chronic  diarrhea this is under good control on Imodium and Lomotil. I will give her a prescription for Lomotil and she knows she can take either one of these pills 2-3 times a day as needed.  Patient Instructions: 1)  Take 1-2 immodium a day, 2)  Take 1-2 lomotil a day as needed. 3)  The medication list was reviewed and reconciled.  All changed / newly prescribed medications were explained.  A complete medication list was provided to the patient / caregiver. Prescriptions: DIPHENOXYLATE-ATROPINE 2.5-0.025 MG TABS (DIPHENOXYLATE-ATROPINE) take one to two pills a day as needed  #60 x 6   Entered and Authorized by:   Rachael Fee MD   Signed by:   Rachael Fee MD on 07/18/2010   Method used:   Print then Give to Patient   RxID:   [id number]

## 2010-08-16 NOTE — Letter (Signed)
Summary: [facility]  [facility]   Imported By: Maryln Gottron 08/08/2010 13:10:03  _____________________________________________________________________  External Attachment:    Type:   Image     Comment:   External Document

## 2010-09-06 ENCOUNTER — Ambulatory Visit: Payer: Self-pay | Admitting: Internal Medicine

## 2010-09-06 ENCOUNTER — Ambulatory Visit (INDEPENDENT_AMBULATORY_CARE_PROVIDER_SITE_OTHER): Payer: Medicare Other | Admitting: Family Medicine

## 2010-09-06 ENCOUNTER — Encounter: Payer: Self-pay | Admitting: Family Medicine

## 2010-09-06 VITALS — BP 160/73 | Temp 98.0°F | Ht 60.75 in | Wt 176.0 lb

## 2010-09-06 DIAGNOSIS — K1120 Sialoadenitis, unspecified: Secondary | ICD-10-CM

## 2010-09-06 MED ORDER — CEPHALEXIN 500 MG PO CAPS: 500.0000 mg | ORAL_CAPSULE | Freq: Three times a day (TID) | ORAL | Status: AC

## 2010-09-06 NOTE — Progress Notes (Signed)
  Subjective:    Patient ID: Susan [name], female    DOB: Aug 24, 1918, 77 y.o.   MRN: [id number]  HPI  Patient seen as work in with acute right parotid swelling one day history. History of multiple similar occurrences in the past. Previously has had infection involving the gland. Somewhat less swollen today than last night. Exacerbated by sour foods. Denies any fever or chills. No antibiotic allergies known.   Review of Systems     Objective:   Physical Exam  patient is alert and nontoxic in appearance. She is afebrile.  Facial exam reveals some swelling of the right parotid. Very faint erythema but no fluctuance. No warmth. Minimally tender.  Oropharynx is clear and moist Neck is supple no adenopathy Chest good auscultation Heart regular rhythm and rate       Assessment & Plan:   right-sided parotitis. Encouraged adequate hydration. Cephalexin 500 mg 3 times a day for 10 days. ENT referral if no better in one to 2 weeks

## 2010-09-06 NOTE — Patient Instructions (Signed)
Parotitis (Inflammation of the Parotid Gland)   Parotitis is an inflammation of one or both parotid glands. This is the main salivary gland. It lies behind the angle of the jaw and below the ear lobe. The saliva produced comes out of a tiny opening (duct) inside the cheek on either side. It is usually at the level of the upper back teeth. If the parotid gland is swollen, the ear is pushed up and out in a particular way. This helps set this condition apart from a simple lymph gland infection in the same area.   CAUSES Cases of mumps have mostly disappeared since the start of immunization against mumps. So the most common cause now of parotitis are:   Germ (bacterial) infection.    Inflammation of the lymph channels  (lymphatics).    ACUTE (SUDDEN ONSET) BACTERIAL PAROTITIS This is a sudden inflammatory response to bacterial infection that causes:  Redness (erythema).  Pain.  Swelling.  Tenderness over the gland on the side of the cheek.  The appearance of pus from the opening of the duct on the inside of the cheek.   It used to be common in dehydrated and debilitated patients, and often following surgery. It is now more commonly seen after radiotherapy (X-ray treatment) or in patients with a poorly working immune system. Treatment includes:    Correction of the lack of fluids (rehydration).   Medications which kill germs (antibiotics).   Pain relief.   CHRONIC RECURRENT PAROTITIS This refers to repeated episodes of discomfort and swelling of the parotid gland. This occurs often after eating. It is caused by decreased flow of saliva. This is often due to either blockage of the duct by a stone or the formation of a duct narrowing. It is treated with:   Gland massage.   Methods to stimulate the flow of saliva (for example, giving lemon juice).   Antibiotics if required. Surgery to remove the gland is possible. The benefits of surgery need to be balanced against the risk of damage  to the facial nerve. The facial nerve allows the muscles of facial expression to function. Damage to this can cause paralysis of one side of the face.    VIRAL PAROTITIS The most common viral cause of parotitis is mumps. It usually affects 4 to 10 year olds. It causes painful swelling of both parotid glands.   RECURRENT PAROTITIS IN CHILDREN This condition is thought to be due to swelling or ballooning of the ducts (ectasia). It results in the same symptoms as acute bacterial parotitis. It is usually caused by bacteria called streptococci, IT is treated with penicillin. It usually gets well by itself without treatment (self-limiting). Surgery is usually not needed.   TUBERCULOSIS The parotid glands may become infected with the same bacteria causing tuberculosis ("TB"). Treatment is with anti-tuberculous antibiotic therapy.   OTHER UNCOMMON CAUSES OF PAROTITIS  Sjogren's syndrome. A condition in which arthritis is associated with a decrease in activity of the glands of the body that produce saliva and tears. Some people are bothered by a dry mouth and intermittent salivary gland enlargement. The diagnosis is made with blood tests or by examination of a piece of tissue from the inside of the lip.  Atypical mycobacteria. Can give rise to a condition that usually infects children. It is a germ similar to tuberculosis. It is often resistant to antibiotic treatment. It may require surgical treatment and removal of the infected salivary gland.  Actinomycosis. An infection of the parotid gland that  may also involve the overlying skin. The diagnosis is made by detecting granules of sulphur, produced by the bacteria, on microscopic examination. Treatment is with a prolonged course of penicillin for up to one year.   HOME CARE INSTRUCTIONS  Apply ice bags about every 2 hours, for 20 to 30 minutes, while awake, to the sore gland. Place ice in a plastic bag with a towel around it to prevent frostbite to  skin. Continue for 24 hours and then as directed by your caregiver.  Only take over-the-counter or prescription medicines for pain, discomfort, or fever as directed by your caregiver.    SEEK IMMEDIATE MEDICAL ATTENTION IF :  There is increased pain or swelling in your gland that is not controlled with medication.  An oral temperature above 102 F (38.9 C) develops, not controlled by medication.   Document Released: 10/14/2002  Document Re-Released: 12/12/2007 [facility] Patient Information 2011 [place], [place].

## 2010-09-14 ENCOUNTER — Other Ambulatory Visit: Payer: Self-pay | Admitting: Internal Medicine

## 2010-10-27 ENCOUNTER — Ambulatory Visit: Payer: Self-pay | Admitting: Internal Medicine

## 2010-11-08 ENCOUNTER — Ambulatory Visit (INDEPENDENT_AMBULATORY_CARE_PROVIDER_SITE_OTHER): Payer: Medicare Other | Admitting: Internal Medicine

## 2010-11-08 DIAGNOSIS — E039 Hypothyroidism, unspecified: Secondary | ICD-10-CM

## 2010-11-08 DIAGNOSIS — C679 Malignant neoplasm of bladder, unspecified: Secondary | ICD-10-CM

## 2010-11-08 DIAGNOSIS — I10 Essential (primary) hypertension: Secondary | ICD-10-CM

## 2010-11-08 LAB — BASIC METABOLIC PANEL
BUN: 18 mg/dL (ref 6–23)
Glucose, Bld: 83 mg/dL (ref 70–99)
Potassium: 4.1 mEq/L (ref 3.5–5.1)

## 2010-11-08 LAB — TSH: TSH: 0.36 u[IU]/mL (ref 0.35–5.50)

## 2010-11-08 NOTE — Assessment & Plan Note (Signed)
Needs f/u Check labs today 

## 2010-11-08 NOTE — Assessment & Plan Note (Signed)
Sees dr Annabell Howells yearly.

## 2010-11-08 NOTE — Assessment & Plan Note (Signed)
Fair control Continue same meds 

## 2010-11-08 NOTE — Progress Notes (Signed)
  Subjective:    Patient ID: Susan [name], female    DOB: [birth year], 77 y.o.   MRN: [id number]  HPI Reviewed dr burchette's note---much improved  OA knees--receives q 3 month injection  HTN--tolerating meds  Hypothyroid---tolerating replacement therapy  Past Medical History  Diagnosis Date  . CARCINOMA, BLADDER, TRANSITIONAL CELL 10/24/2007  . HYPERTENSION 01/07/2007  . HYPOTHYROIDISM 01/07/2007  . IBS 05/03/2008  . MIXED HEARING LOSS BILATERAL 09/28/2009  . OSTEOARTHRITIS, KNEE 04/25/2007  . UNSPECIFIED ARTHROPATHY MULTIPLE SITES 10/26/2009   Past Surgical History  Procedure Date  . Cataract extraction     reports that she quit smoking about 37 years ago. Her smoking use included Cigarettes. She has a 10 pack-year smoking history. She does not have any smokeless tobacco history on file. Her alcohol and drug histories not on file. family history is not on file. Allergies  Allergen Reactions  . Ace Inhibitors     REACTION: cough  . Hydrocodone     REACTION: Hallucinations---minor      Review of Systems  patient denies chest pain, shortness of breath, orthopnea. Denies lower extremity edema, abdominal pain, change in appetite, change in bowel movements. Patient denies rashes, musculoskeletal complaints. No other specific complaints in a complete review of systems.      Objective:   Physical Exam  Well-developed well-nourished female in no acute distress. HEENT exam atraumatic, normocephalic, extraocular muscles are intact. Neck is supple. No jugular venous distention no thyromegaly. Chest clear to auscultation without increased work of breathing. Cardiac exam S1 and S2 are regular 3/6 sem . Abdominal exam active bowel sounds, soft, nontender. Extremities no edema. Neurologic exam she is alert without any motor sensory deficits. Gait is normal.        Assessment & Plan:

## 2010-11-19 ENCOUNTER — Encounter: Payer: Self-pay | Admitting: Internal Medicine

## 2010-11-24 NOTE — Op Note (Signed)
NAME:  Susan [name], Susan [name]               ACCOUNT NO.:  [id number]   MEDICAL RECORD NO.:  [id number]          PATIENT TYPE:  AMB   LOCATION:  [facility]                         FACILITY:  [facility]   PHYSICIAN:  Excell Seltzer. Annabell Howells, M.D.    DATE OF BIRTH:  1919/05/09   DATE OF PROCEDURE:  08/08/2006  DATE OF DISCHARGE:                               OPERATIVE REPORT   PROCEDURE:  Transurethral resection of bladder tumor, a medium tumor,  with installation of mitomycin C.   PREOPERATIVE DIAGNOSIS:  Bladder tumor.   POSTOPERATIVE DIAGNOSIS:  Bladder tumor.   SURGEON:  Bjorn Pippin, MD   ANESTHESIA:  General.   SPECIMEN:  Bladder tumor chips.   DRAINS:  A 20-French Foley catheter.   COMPLICATIONS:  None.   INDICATIONS:  [name] is an 77 year old white female who was found to  have a tumor on the right lateral wall of the bladder that required  resection.   FINDINGS AND PROCEDURE:  The patient was given Cipro.  She was taken to  the operating room, where a general anesthetic was induced.  She was  placed in the lithotomy position.  Her perineum and genitalia were  prepped with Betadine solution and she was draped in the usual sterile  fashion.  Cystoscopy was performed using a 22-French scope and 12- and  70-degree lenses.  Examination revealed a normal urethra.  The bladder  wall had mild trabeculation.  The ureteral orifices were unremarkable.  On the right lateral wall was about a 4-cm patch of tumor fronds that  appeared suspicious for carcinoma in situ with a central papillary  component.   A 28-French continuous-flow resectoscope sheath was inserted; this was  fitted with an Wandra Scot handle with 12-degrees lens and a loop and the  tumor was resected.  The surrounding areas were fulgurated.  The chips  were removed.   After completion of resection, 30 mL of diluent with 30 mg of mitomycin  C were instilled into the bladder through a 20-French Foley catheter;  this was left indwelling for 15  minutes and the bladder was then  drained.  The patient was taken down from the lithotomy position, her  anesthetic was reversed and she was removed to the recovery room in  stable condition.  There were no complications.      Excell Seltzer. Annabell Howells, M.D.  Electronically Signed     JJW/MEDQ  D:  08/08/2006  T:  08/08/2006  Job:  [id number]

## 2010-12-13 ENCOUNTER — Other Ambulatory Visit: Payer: Self-pay | Admitting: Internal Medicine

## 2010-12-27 ENCOUNTER — Other Ambulatory Visit: Payer: Self-pay | Admitting: Dermatology

## 2011-03-21 ENCOUNTER — Other Ambulatory Visit: Payer: Self-pay | Admitting: Gastroenterology

## 2011-03-21 MED ORDER — DIPHENOXYLATE-ATROPINE 2.5-0.025 MG PO TABS: 1.0000 | ORAL_TABLET | Freq: Every day | ORAL | Status: DC | PRN

## 2011-03-21 NOTE — Telephone Encounter (Signed)
Pt calling for more refills of Lomotil.  Can we refill?  1. Colonic adenomas. Colonoscopy 2006 by Dr. Vilinda Boehringer removed 3 subcentimeter adenomas.  Usual followup would be at 3 year interval however given her age we decided not to repeat colonoscopy as yet. She is also very uninterested in repeat colonoscopy.  very pleasant 77 year old woman whom I last saw7-8 months ago. She has mild, chronic diarrhea that is under good control on Imodium 2-3 times a day. She tried some of her friends Lomotil and it helped even better. She was asking for a prescription for her. She sees no overt GI bleeding.   Impression & Recommendations:  Problem # 1:  chronic diarrhea this is under good control on Imodium and Lomotil. I will give her a prescription for Lomotil and she knows she can take either one of these pills 2-3 times a day as needed.  Patient Instructions: 1)  Take 1-2 immodium a day, 2)  Take 1-2 lomotil a day as needed. 3)  The medication list was reviewed and reconciled.  All changed / newly prescribed medications were explained.  A complete medication list was provided to the patient / caregiver. Prescriptions: DIPHENOXYLATE-ATROPINE 2.5-0.025 MG TABS (DIPHENOXYLATE-ATROPINE) take one to two pills a day as needed  #60 x 6  Entered and Authorized by: Rachael Fee MD  Signed by: Rachael Fee MD on 07/18/2010  Method used: Print then Give to Patient  RxID: [id number]

## 2011-03-21 NOTE — Telephone Encounter (Signed)
Yes, ok to refill, thanks

## 2011-03-21 NOTE — Telephone Encounter (Signed)
Pt aware med faxed

## 2011-04-23 ENCOUNTER — Ambulatory Visit: Payer: BC Managed Care – PPO | Admitting: Gastroenterology

## 2011-05-11 ENCOUNTER — Encounter: Payer: Self-pay | Admitting: Internal Medicine

## 2011-05-11 ENCOUNTER — Ambulatory Visit (INDEPENDENT_AMBULATORY_CARE_PROVIDER_SITE_OTHER): Payer: Medicare Other | Admitting: Internal Medicine

## 2011-05-11 DIAGNOSIS — I10 Essential (primary) hypertension: Secondary | ICD-10-CM

## 2011-05-11 DIAGNOSIS — M1710 Unilateral primary osteoarthritis, unspecified knee: Secondary | ICD-10-CM

## 2011-05-11 DIAGNOSIS — K589 Irritable bowel syndrome without diarrhea: Secondary | ICD-10-CM

## 2011-05-11 NOTE — Patient Instructions (Signed)
Carvedilol Take one in the morning ONLY for one week Than take one in the morning and 1/2 in the evening.

## 2011-05-11 NOTE — Progress Notes (Signed)
  Subjective:    Patient ID: Susan [name], female    DOB: 12-08-1918, 77 y.o.   MRN: [id number]  HPI  htn---tolerating meds but admits to orthostatic sxs in the a.m.  Hypothyroid--labs have been normal. tolderating meds  OA knees---sees ortho for quarterly steroid injections  Past Medical History  Diagnosis Date  . CARCINOMA, BLADDER, TRANSITIONAL CELL 10/24/2007  . HYPERTENSION 01/07/2007  . HYPOTHYROIDISM 01/07/2007  . IBS 05/03/2008  . MIXED HEARING LOSS BILATERAL 09/28/2009  . OSTEOARTHRITIS, KNEE 04/25/2007  . UNSPECIFIED ARTHROPATHY MULTIPLE SITES 10/26/2009   Past Surgical History  Procedure Date  . Cataract extraction     reports that she quit smoking about 37 years ago. Her smoking use included Cigarettes. She has a 10 pack-year smoking history. She does not have any smokeless tobacco history on file. Her alcohol and drug histories not on file. family history is not on file. Allergies  Allergen Reactions  . Ace Inhibitors     REACTION: cough  . Hydrocodone     REACTION: Hallucinations---minor     Review of Systems  patient denies chest pain, shortness of breath, orthopnea. Denies lower extremity edema, abdominal pain, change in appetite, change in bowel movements. Patient denies rashes, musculoskeletal complaints. No other specific complaints in a complete review of systems.      Objective:   Physical Exam  Well-developed well-nourished female in no acute distress. HEENT exam atraumatic, normocephalic, extraocular muscles are intact. Neck is supple. No jugular venous distention no thyromegaly. Chest clear to auscultation without increased work of breathing. Cardiac exam S1 and S2 are regular.3/6 SEM. Abdominal exam active bowel sounds, soft, nontender. Extremities no edema. Neurologic exam she is alert without any motor sensory deficits . Using walker        Assessment & Plan:

## 2011-05-19 NOTE — Assessment & Plan Note (Signed)
BP Readings from Last 3 Encounters:  05/11/11 132/74  11/08/10 142/70  09/06/10 160/73   Fair control Continue meds

## 2011-05-19 NOTE — Assessment & Plan Note (Signed)
She has end stage disease Sees ortho regularly for injections

## 2011-05-19 NOTE — Assessment & Plan Note (Signed)
sxs are reasonably well controlled

## 2011-05-28 ENCOUNTER — Telehealth: Payer: Self-pay | Admitting: *Deleted

## 2011-05-28 NOTE — Telephone Encounter (Signed)
Pt was to report to Dr. Cato Mulligan re: her dizziness and BP meds.  She is still having dizziness on Coreg.  She states she might be a little better, but needs refills on BP meds that he wants her to stay on.

## 2011-05-31 NOTE — Telephone Encounter (Signed)
Ok to refill meds

## 2011-06-01 MED ORDER — CARVEDILOL 12.5 MG PO TABS: 12.5000 mg | ORAL_TABLET | Freq: Two times a day (BID) | ORAL | Status: DC

## 2011-06-27 ENCOUNTER — Ambulatory Visit (INDEPENDENT_AMBULATORY_CARE_PROVIDER_SITE_OTHER): Payer: Medicare Other | Admitting: Internal Medicine

## 2011-06-27 ENCOUNTER — Telehealth: Payer: Self-pay

## 2011-06-27 ENCOUNTER — Encounter: Payer: Self-pay | Admitting: Internal Medicine

## 2011-06-27 DIAGNOSIS — I359 Nonrheumatic aortic valve disorder, unspecified: Secondary | ICD-10-CM

## 2011-06-27 DIAGNOSIS — I10 Essential (primary) hypertension: Secondary | ICD-10-CM

## 2011-06-27 DIAGNOSIS — I358 Other nonrheumatic aortic valve disorders: Secondary | ICD-10-CM

## 2011-06-27 DIAGNOSIS — R42 Dizziness and giddiness: Secondary | ICD-10-CM

## 2011-06-27 NOTE — Patient Instructions (Signed)
Take 1/2 dose of carvedilol 12.5 mg twice Reichenberger. Our office will contact you re:  2 D Echo test

## 2011-06-27 NOTE — Assessment & Plan Note (Signed)
Decrease carvedilol dose to 12.5 mg one half tablet twice Cal[place]. BP: 138/84 mmHg

## 2011-06-27 NOTE — Telephone Encounter (Signed)
Pt states she was in about a month ago with dizzy spells and her medication was changed. Pt states she has not taken her bp medication today.  Pt aware of appt with Dr. Artist Pais today at 2:00 pm.

## 2011-06-27 NOTE — Assessment & Plan Note (Addendum)
77 year old female complains of intermittent dizziness. Her symptoms are aggravated by prolonged sitting. There is no evidence of orthostasis on today's exam. She skipped her carvedilol dose this morning. Chest exam reveals fairly loud murmur of aortic stenosis. Obtain 2-D echocardiogram to further assess. In the interim, patient advised to decrease her carvedilol dose to 12.5 mg one half tablet twice a day. Depending on echo cardiogram findings she may need cardiology consult.

## 2011-06-27 NOTE — Progress Notes (Signed)
Subjective:    Patient ID: Susan [name], female    DOB: 03-08-1919, 77 y.o.   MRN: [id number]  HPI  77 year old white female complains of intermittent dizziness. Patient was previously seen by Dr. Cato Mulligan who decreased her carvedilol dose in the evening. Despite this change patient reports she is still having intermittent symptoms. Her symptoms are worse after prolonged sitting. She denies symptoms of vertigo. Her symptoms are not triggered by changes in her head position.  She denies palpitations or rapid heart rate.   Review of Systems  She reports intermittent chest pain "nothing unusual for a 77 year old woman" Negative for syncope   Past Medical History  Diagnosis Date  . CARCINOMA, BLADDER, TRANSITIONAL CELL 10/24/2007  . HYPERTENSION 01/07/2007  . HYPOTHYROIDISM 01/07/2007  . IBS 05/03/2008  . MIXED HEARING LOSS BILATERAL 09/28/2009  . OSTEOARTHRITIS, KNEE 04/25/2007  . UNSPECIFIED ARTHROPATHY MULTIPLE SITES 10/26/2009    History   Social History  . Marital Status: Widowed    Spouse Name: N/A    Number of Children: N/A  . Years of Education: N/A   Occupational History  . Not on file.   Social History Main Topics  . Smoking status: Former Smoker -- 0.5 packs/day for 20 years    Types: Cigarettes    Quit date: 09/05/1973  . Smokeless tobacco: Not on file  . Alcohol Use: Not on file  . Drug Use: Not on file  . Sexually Active: Not on file   Other Topics Concern  . Not on file   Social History Narrative  . No narrative on file    Past Surgical History  Procedure Date  . Cataract extraction     No family history on file.  Allergies  Allergen Reactions  . Ace Inhibitors     REACTION: cough  . Hydrocodone     REACTION: Hallucinations---minor    Current Outpatient Prescriptions on File Prior to Visit  Medication Sig Dispense Refill  . acetaminophen (TYLENOL) 500 MG tablet Take 1,000 mg by mouth Donado. And 1 at bedtime       . calcium carbonate (TUMS)  500 MG chewable tablet Chew 1 tablet by mouth as needed.        . carvedilol (COREG) 12.5 MG tablet Take 1 tablet (12.5 mg total) by mouth 2 (two) times Spainhour with a meal.  60 tablet  4  . diphenoxylate-atropine (LOMOTIL) 2.5-0.025 MG per tablet Take 1-2 tablets by mouth Cuyler as needed for diarrhea/loose stools.  60 tablet  6  . levothyroxine (SYNTHROID, LEVOTHROID) 75 MCG tablet Take 75 mcg by mouth Giebel.        Marland Kitchen loperamide (IMODIUM A-D) 2 MG tablet Take 4 mg by mouth Debroux.        . Multiple Vitamin (MULTIVITAMIN) tablet Take 1 tablet by mouth Pruett.          BP 138/84  Pulse 76  Temp(Src) 98.3 F (36.8 C) (Oral)  Wt 165 lb (74.844 kg)  EKG shows normal sinus rhythm at 69 beats per minute. No ST changes noted     Objective:   Physical Exam  Constitutional: She is oriented to person, place, and time. She appears well-developed and well-nourished. No distress.  HENT:  Head: Normocephalic and atraumatic.  Neck: Normal range of motion. Neck supple.  Cardiovascular: Normal rate and regular rhythm.        2-3/6 systolic ejection murmur loudest at right sternal border  Pulmonary/Chest: Effort normal and breath sounds normal. No respiratory distress.  She has no wheezes. She has no rales.  Abdominal: Soft. Bowel sounds are normal.  Neurological: She is alert and oriented to person, place, and time.  Skin: Skin is warm.  Psychiatric: She has a normal mood and affect. Her behavior is normal.          Assessment & Plan:

## 2011-06-28 ENCOUNTER — Ambulatory Visit (HOSPITAL_COMMUNITY): Payer: Medicare Other | Attending: Cardiology

## 2011-06-28 DIAGNOSIS — I059 Rheumatic mitral valve disease, unspecified: Secondary | ICD-10-CM | POA: Insufficient documentation

## 2011-06-28 DIAGNOSIS — I10 Essential (primary) hypertension: Secondary | ICD-10-CM | POA: Insufficient documentation

## 2011-06-28 DIAGNOSIS — I358 Other nonrheumatic aortic valve disorders: Secondary | ICD-10-CM

## 2011-06-28 DIAGNOSIS — I079 Rheumatic tricuspid valve disease, unspecified: Secondary | ICD-10-CM | POA: Insufficient documentation

## 2011-06-28 DIAGNOSIS — R42 Dizziness and giddiness: Secondary | ICD-10-CM | POA: Insufficient documentation

## 2011-06-28 DIAGNOSIS — I359 Nonrheumatic aortic valve disorder, unspecified: Secondary | ICD-10-CM | POA: Insufficient documentation

## 2011-06-28 DIAGNOSIS — R011 Cardiac murmur, unspecified: Secondary | ICD-10-CM | POA: Insufficient documentation

## 2011-06-29 ENCOUNTER — Other Ambulatory Visit: Payer: Self-pay | Admitting: Internal Medicine

## 2011-06-29 DIAGNOSIS — I350 Nonrheumatic aortic (valve) stenosis: Secondary | ICD-10-CM

## 2011-07-11 ENCOUNTER — Telehealth: Payer: Self-pay | Admitting: *Deleted

## 2011-07-11 ENCOUNTER — Telehealth: Payer: Self-pay | Admitting: Family Medicine

## 2011-07-11 NOTE — Telephone Encounter (Signed)
Is she symptomatic when her BP is elevated?  (chest pain or headache)

## 2011-07-11 NOTE — Telephone Encounter (Signed)
Pt has appt on 1/17 with Dr Antoine Poche.  Her BP jumped up to 170/80 when the nurse started walking her.  They are suppose to monitor her BP q4h.  Do you want her to stay an additional night?

## 2011-07-11 NOTE — Telephone Encounter (Signed)
Triage Record Num: [id number] Operator: Valene Bors Patient Name: Susan [name] Call Date & Time: 07/10/2011 12:33:58PM Patient Phone: [phone] PCP: Valetta Mole. Swords Patient Gender: Female PCP Fax : [phone] Patient DOB: 12-08-1918 [facility] Name[facility] Reason for Call: Caller: Sil/RN; PCP: Birdie Sons; CB#: [phone]; Call regarding Katrine Coho Dizziness for past few days and headaches in the morning for past week. Today dizziness worse and legs feel weak-07/09/10. BP = 174/ 1210 @ BP= 180/100 @ 1250. Uses walker and she able to walk, no chest pain or vision problems. Morning dose of Carvedilol was decreased on 07/01/11 to 1/2 tab in the morning and she gets 1/2 tab in the evening. Spoke to Dr. Lovell Sheehan and he advised to give 1/2 tab now and to increase dose back up to 1 tab in the mornings. Level of care increased to Assisted Living and Neuro checks with VS q 4 hrs x 48 hours. Call if sx not improved and send to ER if level of care not able to be increased. Care advice per HTN and Dizziness Protocols. Protocol(s) Used: Hypertension, Diagnosed or Suspected Recommended Outcome per Protocol: See Provider within 72 Hours Reason for Outcome: Multiple elevated blood pressure readings without other symptoms AND no previous work-up OR readings exceed expected range defined by treatment plan Care Advice: Call EMS 911 if new symptoms develop, such as severe shortness of breath, chest pain, change in mental status, acute neurologic deficit, seizure, visual disturbances, pulse rate > 120 / minute, or very irregular pulse. ~ It is important to have blood pressure checked at least annually, or at each provider visit, especially if you have a history of high blood pressure in your family. ~ ~ Call provider if systolic BP is 180 or greater, or if diastolic BP is 120 or greater. ~ HEALTH PROMOTION / MAINTENANCE Avoid the use of stimulants including caffeine (coffee, some soft  drinks, some energy drinks, tea and chocolate), cocaine, and amphetamines. Also avoid drinking alcohol. ~ ~ List, or take, all current prescription(s), nonprescription or alternative medication(s) to provider for evaluation. Medication Advice: - Discontinue all nonprescription and alternative medications, especially stimulants, until evaluated by provider. - Take prescribed medications as directed, following label instructions for the medication. - Do not change medications or dosing regimen until provider is consulted. - Know possible side effects of medication and what to do if they occur. - Tell provider all prescription, nonprescription or alternative medications that you take ~ 07/10/2011 1:14:14PM Page 1 of 1 [id number] [facility] Triage Call Report Triage Record Num: [id number] Operator: Valene Bors Patient Name: Susan [name] Call Date & Time: 07/10/2011 12:33:58PM Patient Phone: [phone] PCP: Valetta Mole. Swords Patient Gender: Female PCP Fax : [phone] Patient DOB: 02-27-1919 [facility] Name[facility] Reason for Call: Caller: Sil/RN; PCP: Birdie Sons; CB#: [phone]; Call regarding Katrine Coho Dizziness for past few days and headaches in the morning for past week. Today dizziness worse and legs feel weak-07/09/10. BP = 174/ 1210 @ BP= 180/100 @ 1250. Uses walker and she able to walk, no chest pain or vision problems. Morning dose of Carvedilol was decreased on 07/01/11 to 1/2 tab in the morning and she gets 1/2 tab in the evening. Spoke to Dr. Lovell Sheehan and he advised to give 1/2 tab now and to increase dose back up to 1 tab in the mornings. Level of care increased to Assisted Living and Neuro checks with VS q 4 hrs x 48 hours. Call if sx not improved  and send to ER if level of care not able to be increased. Care advice per HTN and Dizziness Protocols. Protocol(s) Used: Dizziness or Vertigo Recommended Outcome per Protocol: See Provider within 24  hours Reason for Outcome: Previously evaluated and worsening symptoms interfering with ability to carry out activities of [facility] (ADLs) Care Advice: Call EMS 911 if patient develops confusion, decreased level of consciousness, chest pain, shortness of breath, or focal neurologic abnormalities such as facial droop or weakness of one extremity. ~ ~ DO NOT drive until condition evaluated. ~ Protect from falling or other injury. ~ Should not be alone, arrange for support (family member, friend, etc.). Avoid caffeine (coffee, tea, cola drinks, or chocolate), alcohol, and nicotine (use of tobacco), as use of these substances may worsen symptoms. ~ ~ Call provider immediately if have difficulty walking, vision problems, or weakness. ~ SYMPTOM / CONDITION MANAGEMENT ~ SAFETY / PROTECTION ~ CAUTIONS ~ List, or take, all current prescription(s), nonprescription or alternative medication(s) to provider for evaluation. ~ Lie still in a dimly lit room and avoid any sudden change in position. 07/10/2011 1:14:16PM Page 1 of 1 [id number]

## 2011-07-11 NOTE — Telephone Encounter (Signed)
Yes, ok to DC pt home.  When is her appointment with cardiology?

## 2011-07-11 NOTE — Telephone Encounter (Signed)
Triage Record Num: [id number] Operator: Amy Head Patient Name: Susan [name] Call Date & Time: 07/10/2011 6:23:29PM Patient Phone: [phone] PCP: Valetta Mole. Swords Patient Gender: Female PCP Fax : [phone] Patient DOB: Jun 22, 1919 [facility] Name[facility] Reason for Call: Caller: Heather/Rn; PCP: Birdie Sons; CB#: [phone]; Call regarding Needing Order for self medicate; Called earlier for admission orders. Pt usually lives in the apartments there, but was transfered to AL X 48 hours to watch pt for recent c/o dizziness and increase in BP. Pt requesting an order from MD to allow her to self medicate. RN verified with pt all of her meds and pt is very aware of dosing. Spoke with Dr. Lovell Sheehan, order given for pt to self medicate. Protocol(s) Used: Medication Question Calls, No Triage (Adults) Recommended Outcome per Protocol: Provide Information or Advice Only Reason for Outcome: Caller has medication question only and triager answers question Care Advice: ~

## 2011-07-11 NOTE — Telephone Encounter (Signed)
Dr Artist Pais seen pt on 06/27/11 for dizziness.  Carvedilol was was decreased to 12.5 mg 1/2 tab bid.  Her BP shot up to 175/100 Monday, 180/100 Tuesday.  She was admitted to the medical unit at [facility] and they increased the carvedilol back to 1 tab bid and now her bp has been 137/70, 150/80, 159/80 med was given and it dropped to 112/70 with a HR of 74.  They want to know if they can d/c her home.  Please advise

## 2011-07-12 ENCOUNTER — Telehealth: Payer: Self-pay | Admitting: *Deleted

## 2011-07-12 NOTE — Telephone Encounter (Signed)
**  Susan [name]** Pt is being discharged today (hopefully around 1:30pm) from [facility].  Pt was admitted there due to elevated BP.  BP reading today was 124/76.  Requesting an order for pt to be discharged today.

## 2011-07-12 NOTE — Telephone Encounter (Signed)
If pt wants to stay additional day, OK for verbal order.   Please see if we can expedite referral to cardiology.

## 2011-07-12 NOTE — Telephone Encounter (Signed)
Pt is not comfortable being d/c but she is asymptomatic.

## 2011-07-13 NOTE — Telephone Encounter (Signed)
Verbal order given, they faxed over orders to be signed and I will fax it back

## 2011-07-25 ENCOUNTER — Encounter: Payer: Self-pay | Admitting: Cardiology

## 2011-07-26 ENCOUNTER — Ambulatory Visit (INDEPENDENT_AMBULATORY_CARE_PROVIDER_SITE_OTHER): Payer: Medicare Other | Admitting: Cardiology

## 2011-07-26 ENCOUNTER — Encounter: Payer: Self-pay | Admitting: Cardiology

## 2011-07-26 VITALS — BP 135/75 | HR 80 | Ht 60.0 in | Wt 168.0 lb

## 2011-07-26 DIAGNOSIS — I10 Essential (primary) hypertension: Secondary | ICD-10-CM | POA: Diagnosis not present

## 2011-07-26 DIAGNOSIS — R42 Dizziness and giddiness: Secondary | ICD-10-CM

## 2011-07-26 DIAGNOSIS — R0989 Other specified symptoms and signs involving the circulatory and respiratory systems: Secondary | ICD-10-CM

## 2011-07-26 DIAGNOSIS — I359 Nonrheumatic aortic valve disorder, unspecified: Secondary | ICD-10-CM

## 2011-07-26 DIAGNOSIS — I350 Nonrheumatic aortic (valve) stenosis: Secondary | ICD-10-CM

## 2011-07-26 NOTE — Progress Notes (Signed)
HPI The patient presents for evaluation of dizziness. She has no significant cardiac history. However, in December at [facility] she was getting dizzy. This would happen in the morning in particular. She would wake up and feel extremely dizzy. She didn't describe vertigo. She didn't have any motor or speech deficits. She said she would have to stand and brace herself. She actually stayed in assisted living for 3 days while this was evaluated. On one of those days she was noted to be particularly hypertensive with her dizziness. She was not having any palpitations or presyncope. She denies any chest pressure, neck or arm discomfort. She's had no weight gain or edema. She doesn't describe PND or orthopnea. She actually thinks her symptoms are getting better and she's not been quite bad dizzy since. She's able to and a late with her walker to the dining hall. Of note she was found to have a systolic murmur. I have reviewed an echocardiogram which demonstrated moderate aortic stenosis but well preserved ejection fraction. She had not been told of his diagnosis previously.  Allergies  Allergen Reactions  . Ace Inhibitors     REACTION: cough  . Hydrocodone     REACTION: Hallucinations---minor    Current Outpatient Prescriptions  Medication Sig Dispense Refill  . acetaminophen (TYLENOL) 500 MG tablet Take 1,000 mg by mouth Garzon. And 1 at bedtime       . calcium carbonate (TUMS) 500 MG chewable tablet Chew 1 tablet by mouth as needed.        . carvedilol (COREG) 12.5 MG tablet Take 0.5 tablets (6.25 mg total) by mouth 2 (two) times Sindt with a meal.  60 tablet  1  . diphenoxylate-atropine (LOMOTIL) 2.5-0.025 MG per tablet Take 1-2 tablets by mouth Authement as needed for diarrhea/loose stools.  60 tablet  6  . levothyroxine (SYNTHROID, LEVOTHROID) 75 MCG tablet Take 75 mcg by mouth Korn.        Marland Kitchen loperamide (IMODIUM A-D) 2 MG tablet Take 4 mg by mouth Draeger.        . Multiple Vitamin  (MULTIVITAMIN) tablet Take 1 tablet by mouth Maxfield.          Past Medical History  Diagnosis Date  . CARCINOMA, BLADDER, TRANSITIONAL CELL 10/24/2007  . HYPERTENSION 01/07/2007  . HYPOTHYROIDISM 01/07/2007  . IBS 05/03/2008  . MIXED HEARING LOSS BILATERAL 09/28/2009  . OSTEOARTHRITIS, KNEE 04/25/2007  . UNSPECIFIED ARTHROPATHY MULTIPLE SITES 10/26/2009    Past Surgical History  Procedure Date  . Cataract extraction   . Bunionectomy   . Bladder tumor excision     Family History  Problem Relation Age of Onset  . Cancer Mother     Stomach    History   Social History  . Marital Status: Widowed    Spouse Name: N/A    Number of Children: 3  . Years of Education: N/A   Occupational History  .     Social History Main Topics  . Smoking status: Former Smoker -- 0.5 packs/day for 20 years    Types: Cigarettes    Quit date: 09/05/1973  . Smokeless tobacco: Not on file  . Alcohol Use: Not on file  . Drug Use: Not on file  . Sexually Active: Not on file   Other Topics Concern  . Not on file   Social History Narrative  . No narrative on file    ROS: Positive for decreased hearing, occasional diarrhea, urinary frequency, gait disturbance and balance issues,  back pain, joint discomfort, occasional leg cramps. Otherwise as stated in the HPI and negative for all other systems.  PHYSICAL EXAM BP 135/75  Pulse 80  Ht 5' (1.524 m)  Wt 168 lb (76.204 kg)  BMI 32.81 kg/m2 GENERAL:  Well appearing for her age HEENT:  Pupils equal round and reactive, fundi not visualized, oral mucosa unremarkable NECK:  No jugular venous distention, waveform within normal limits, carotid upstroke brisk and symmetric, bilateral bruits bruits, no thyromegaly LYMPHATICS:  No cervical, inguinal adenopathy LUNGS:  Clear to auscultation bilaterally BACK:  No CVA tenderness CHEST:  Unremarkable HEART:  PMI not displaced or sustained,S1 and S2 within normal limits, no S3, no S4, no clicks, no rubs, apical  systolic murmur mid peaking radiating out the outflow tract ABD:  Flat, positive bowel sounds normal in frequency in pitch, no bruits, no rebound, no guarding, no midline pulsatile mass, no hepatomegaly, no splenomegaly EXT:  2 plus pulses throughout, no edema, no cyanosis no clubbing SKIN:  No rashes no nodules NEURO:  Cranial nerves II through XII grossly intact, motor grossly intact throughout PSYCH:  Cognitively intact, oriented to person place and time   EKG:  Sinus rhythm, rate 69, axis within normal limits, intervals within normal limits, no acute ST-T wave changes.   ASSESSMENT AND PLAN

## 2011-07-26 NOTE — Patient Instructions (Signed)
Continue current medications as listed.  Your physician has requested that you have a carotid duplex. This test is an ultrasound of the carotid arteries in your neck. It looks at blood flow through these arteries that supply the brain with blood. Allow one hour for this exam. There are no restrictions or special instructions.  Follow up with Dr Antoine Poche in 3 months

## 2011-07-26 NOTE — Assessment & Plan Note (Signed)
The blood pressure is at target. No change in medications is indicated. We will continue with therapeutic lifestyle changes (TLC).  

## 2011-07-26 NOTE — Assessment & Plan Note (Signed)
I suspect that this might be orthostasis. I doubt that it is related to the aortic valve as this is moderately stiff by exam and echo. We discussed conservative measures to try to prevent the dizziness and she will let me know if this worsens rather than continues to improve. At this point I will suggest no change in her medications.

## 2011-07-26 NOTE — Assessment & Plan Note (Signed)
Her aortic stenosis is moderate. At this point I don't think it's contributed to symptoms. We will follow this clinically and with repeat echoes in the future.

## 2011-07-26 NOTE — Assessment & Plan Note (Signed)
She has a bruit versus transmitted murmur. Because of this and the dizziness she will get carotid Dopplers.

## 2011-08-02 DIAGNOSIS — N3942 Incontinence without sensory awareness: Secondary | ICD-10-CM | POA: Diagnosis not present

## 2011-08-02 DIAGNOSIS — Z8551 Personal history of malignant neoplasm of bladder: Secondary | ICD-10-CM | POA: Diagnosis not present

## 2011-08-20 ENCOUNTER — Encounter (INDEPENDENT_AMBULATORY_CARE_PROVIDER_SITE_OTHER): Payer: Medicare Other | Admitting: Cardiology

## 2011-08-20 DIAGNOSIS — R42 Dizziness and giddiness: Secondary | ICD-10-CM | POA: Diagnosis not present

## 2011-08-20 DIAGNOSIS — R0989 Other specified symptoms and signs involving the circulatory and respiratory systems: Secondary | ICD-10-CM

## 2011-09-17 ENCOUNTER — Other Ambulatory Visit: Payer: Self-pay | Admitting: Internal Medicine

## 2011-09-20 DIAGNOSIS — M1710 Unilateral primary osteoarthritis, unspecified knee: Secondary | ICD-10-CM | POA: Diagnosis not present

## 2011-10-26 ENCOUNTER — Ambulatory Visit (INDEPENDENT_AMBULATORY_CARE_PROVIDER_SITE_OTHER): Payer: Medicare Other | Admitting: Cardiology

## 2011-10-26 ENCOUNTER — Encounter: Payer: Self-pay | Admitting: Cardiology

## 2011-10-26 VITALS — BP 140/80 | HR 74 | Ht 60.0 in | Wt 169.4 lb

## 2011-10-26 DIAGNOSIS — R011 Cardiac murmur, unspecified: Secondary | ICD-10-CM | POA: Diagnosis not present

## 2011-10-26 DIAGNOSIS — R42 Dizziness and giddiness: Secondary | ICD-10-CM | POA: Diagnosis not present

## 2011-10-26 DIAGNOSIS — I359 Nonrheumatic aortic valve disorder, unspecified: Secondary | ICD-10-CM | POA: Diagnosis not present

## 2011-10-26 DIAGNOSIS — I350 Nonrheumatic aortic (valve) stenosis: Secondary | ICD-10-CM

## 2011-10-26 NOTE — Assessment & Plan Note (Signed)
She is no longer bothered by this.  This might have been orthostasis and she is practicing conservative therapy.

## 2011-10-26 NOTE — Patient Instructions (Signed)
The current medical regimen is effective;  continue present plan and medications.  Your physician has requested that you have an echocardiogram in 6 months. Echocardiography is a painless test that uses sound waves to create images of your heart. It provides your doctor with information about the size and shape of your heart and how well your heart's chambers and valves are working. This procedure takes approximately one hour. There are no restrictions for this procedure.  Follow up in 6 months with Dr Hochrein.  You will receive a letter in the mail 2 months before you are due.  Please call us when you receive this letter to schedule your follow up appointment.  

## 2011-10-26 NOTE — Assessment & Plan Note (Signed)
This is moderate.  I will follow with an echo in Dec of this year.  We have discussed the symptoms that occur with worsening stenosis.

## 2011-10-26 NOTE — Progress Notes (Signed)
   HPI The patient presents for evaluation of dizziness. Since that visit she has had no further dizziness. She denies any palpitations presyncope or syncope. She rarely gets some slight chest pressure at night. She has none of this during the day. She denies any new shortness of breath, PND or orthopnea. She has had no weight gain or edema.  Allergies  Allergen Reactions  . Ace Inhibitors     REACTION: cough  . Hydrocodone     REACTION: Hallucinations---minor    Current Outpatient Prescriptions  Medication Sig Dispense Refill  . acetaminophen (TYLENOL) 500 MG tablet Take 1,000 mg by mouth Massett. And 1 at bedtime       . calcium carbonate (TUMS) 500 MG chewable tablet Chew 1 tablet by mouth as needed.        . carvedilol (COREG) 12.5 MG tablet Take 0.5 tablets (6.25 mg total) by mouth 2 (two) times Dillin with a meal.  60 tablet  1  . diphenoxylate-atropine (LOMOTIL) 2.5-0.025 MG per tablet Take 1-2 tablets by mouth Weiher as needed for diarrhea/loose stools.  60 tablet  6  . levothyroxine (SYNTHROID, LEVOTHROID) 75 MCG tablet 1 BY MOUTH Hiltner  90 tablet  3  . loperamide (IMODIUM A-D) 2 MG tablet Take 4 mg by mouth Hessel.        . Multiple Vitamin (MULTIVITAMIN) tablet Take 1 tablet by mouth Florea.          Past Medical History  Diagnosis Date  . CARCINOMA, BLADDER, TRANSITIONAL CELL 10/24/2007  . HYPERTENSION 01/07/2007  . HYPOTHYROIDISM 01/07/2007  . IBS 05/03/2008  . MIXED HEARING LOSS BILATERAL 09/28/2009  . OSTEOARTHRITIS, KNEE 04/25/2007  . UNSPECIFIED ARTHROPATHY MULTIPLE SITES 10/26/2009    Past Surgical History  Procedure Date  . Cataract extraction   . Bunionectomy   . Bladder tumor excision     ROS: As stated in the HPI and negative for all other systems.  PHYSICAL EXAM There were no vitals taken for this visit. GENERAL:  Well appearing for her age HEENT:  Pupils equal round and reactive, fundi not visualized, oral mucosa unremarkable NECK:  No jugular venous  distention, waveform within normal limits, carotid upstroke brisk and symmetric, bilateral bruits bruits, no thyromegaly LYMPHATICS:  No cervical, inguinal adenopathy LUNGS:  Clear to auscultation bilaterally BACK:  No CVA tenderness CHEST:  Unremarkable HEART:  PMI not displaced or sustained,S1 and S2 within normal limits, no S3, no S4, no clicks, no rubs, apical systolic murmur mid peaking radiating out the outflow tract ABD:  Flat, positive bowel sounds normal in frequency in pitch, no bruits, no rebound, no guarding, no midline pulsatile mass, no hepatomegaly, no splenomegaly EXT:  2 plus pulses throughout, no edema, no cyanosis no clubbing  EKG:  Sinus rhythm, rate 74, axis within normal limits, intervals within normal limits, no acute ST-T wave changes.  10/26/2011   ASSESSMENT AND PLAN

## 2011-10-29 ENCOUNTER — Other Ambulatory Visit (INDEPENDENT_AMBULATORY_CARE_PROVIDER_SITE_OTHER): Payer: Medicare Other

## 2011-10-29 ENCOUNTER — Encounter: Payer: Self-pay | Admitting: Gastroenterology

## 2011-10-29 ENCOUNTER — Ambulatory Visit (INDEPENDENT_AMBULATORY_CARE_PROVIDER_SITE_OTHER): Payer: Medicare Other | Admitting: Gastroenterology

## 2011-10-29 VITALS — BP 140/70 | HR 84 | Ht 60.0 in | Wt 169.4 lb

## 2011-10-29 DIAGNOSIS — R197 Diarrhea, unspecified: Secondary | ICD-10-CM

## 2011-10-29 LAB — CBC WITH DIFFERENTIAL/PLATELET
Basophils Absolute: 0 10*3/uL (ref 0.0–0.1)
HCT: 34.5 % — ABNORMAL LOW (ref 36.0–46.0)
Hemoglobin: 11.6 g/dL — ABNORMAL LOW (ref 12.0–15.0)
Lymphs Abs: 2.2 10*3/uL (ref 0.7–4.0)
Monocytes Relative: 7.6 % (ref 3.0–12.0)
Neutro Abs: 4.8 10*3/uL (ref 1.4–7.7)
RDW: 14.9 % — ABNORMAL HIGH (ref 11.5–14.6)

## 2011-10-29 NOTE — Progress Notes (Signed)
Review of pertinent gastrointestinal problems: 1. Colonic adenomas. Colonoscopy 2006 by Dr. Vilinda Boehringer removed 3 subcentimeter adenomas. Usual followup would be at 3 year interval however given her age we decided not to repeat colonoscopy as yet. She is also very uninterested in repeat colonoscopy. 2. Mild, chronic diarrhea: 2011, 2012.  Imodium, Lomotil both help   HPI: This is a  very pleasant 78 year old woman whom I last saw January 2012 for mild, chronic loose stools.  Still tends to have loose stools.  She uses a combination of immodium, lomotil.  She feels like her loose stools are limiting her more and more.  Needed 3 immodium, 3 lomotil.  Overall her weight is up a bit.    She has had loose stools for many, many years, even prior to colonoscopy 2006.    Her daughter had colon cancer.    Past Medical History  Diagnosis Date  . CARCINOMA, BLADDER, TRANSITIONAL CELL 10/24/2007  . HYPERTENSION 01/07/2007  . HYPOTHYROIDISM 01/07/2007  . IBS 05/03/2008  . MIXED HEARING LOSS BILATERAL 09/28/2009  . OSTEOARTHRITIS, KNEE 04/25/2007  . UNSPECIFIED ARTHROPATHY MULTIPLE SITES 10/26/2009  . Heart murmur     Past Surgical History  Procedure Date  . Cataract extraction     bilateral  . Bunionectomy     bilateral  . Bladder tumor excision   . Tonsillectomy and adenoidectomy     x2  . Belpharoptosis repair     Current Outpatient Prescriptions  Medication Sig Dispense Refill  . acetaminophen (TYLENOL) 500 MG tablet Take 1,000 mg by mouth Riva. And 1 at bedtime       . calcium carbonate (TUMS) 500 MG chewable tablet Chew 1 tablet by mouth as needed.        . carvedilol (COREG) 12.5 MG tablet Take 0.5 tablets (6.25 mg total) by mouth 2 (two) times Solivan with a meal.  60 tablet  1  . diphenoxylate-atropine (LOMOTIL) 2.5-0.025 MG per tablet Take 1-2 tablets by mouth Leiner as needed for diarrhea/loose stools.  60 tablet  6  . levothyroxine (SYNTHROID, LEVOTHROID) 75 MCG tablet 1 BY MOUTH  P[place]roke  90 tablet  3  . loperamide (IMODIUM A-D) 2 MG tablet Take 4 mg by mouth Paternostro.        . Multiple Vitamin (MULTIVITAMIN) tablet Take 1 tablet by mouth Graven.          Allergies as of 10/29/2011 - Review Complete 10/29/2011  Allergen Reaction Noted  . Ace inhibitors  07/19/2008  . Aspirin  10/26/2011  . Hydrocodone  06/09/2008    Family History  Problem Relation Age of Onset  . Stomach cancer Mother   . Leukemia Father   . Kidney failure Father   . Arthritis Mother   . Anuerysm Brother     brain  . ALS Sister     History   Social History  . Marital Status: Widowed    Spouse Name: N/A    Number of Children: 3  . Years of Education: N/A   Occupational History  . retired    Social History Main Topics  . Smoking status: Former Smoker -- 0.5 packs/day for 20 years    Types: Cigarettes    Quit date: 09/05/1973  . Smokeless tobacco: Never Used  . Alcohol Use: Yes     ocass  . Drug Use: No  . Sexually Active: Not on file   Other Topics Concern  . Not on file   Social History Narrative  . No  narrative on file      Physical Exam: BP 140/70  Pulse 84  Ht 5' (1.524 m)  Wt 169 lb 6 oz (76.828 kg)  BMI 33.08 kg/m2 Constitutional: generally well-appearing Psychiatric: alert and oriented x3 Abdomen: soft, nontender, nondistended, no obvious ascites, no peritoneal signs, normal bowel sounds     Assessment and plan: 78 y.o. female with chronic loose stools, getting worse  She is requiring increasing doses of Lomotil and Imodium. Her loose stools seem to be getting in the way of her Mirabella life. I think we should try to explain them a bit better since the usual, nonspecific method of Imodium and Lomotil as helping. She will get some lab testing today including stool tests and blood tests. When those results are back I will contact her and if they are not helpful then we will likely need to proceed with colonoscopy. Her last one was in 2006 and her daughter was  recently diagnosed with colon cancer.

## 2011-10-29 NOTE — Patient Instructions (Signed)
You will have labs checked today in the basement lab.  Please head down after you check out with the front desk  (stool for c. Diff, culture, fecal leuks, cmet, tsh, cbc).  If these labs are not helpful, then you will likely need a repeat colonoscopy.

## 2011-10-30 ENCOUNTER — Other Ambulatory Visit: Payer: Medicare Other

## 2011-10-30 DIAGNOSIS — R197 Diarrhea, unspecified: Secondary | ICD-10-CM

## 2011-10-30 LAB — COMPREHENSIVE METABOLIC PANEL
ALT: 11 U/L (ref 0–35)
AST: 18 U/L (ref 0–37)
CO2: 28 mEq/L (ref 19–32)
Creatinine, Ser: 0.8 mg/dL (ref 0.4–1.2)
GFR: 75.89 mL/min (ref >60.00)
Total Bilirubin: 0.2 mg/dL — ABNORMAL LOW (ref 0.3–1.2)

## 2011-10-31 LAB — FECAL LACTOFERRIN, QUANT: Lactoferrin: NEGATIVE

## 2011-11-03 LAB — STOOL CULTURE

## 2011-11-09 ENCOUNTER — Encounter: Payer: Self-pay | Admitting: Internal Medicine

## 2011-11-09 ENCOUNTER — Ambulatory Visit (INDEPENDENT_AMBULATORY_CARE_PROVIDER_SITE_OTHER): Payer: Medicare Other | Admitting: Internal Medicine

## 2011-11-09 VITALS — BP 154/80 | HR 88 | Temp 98.7°F | Wt 169.0 lb

## 2011-11-09 DIAGNOSIS — I10 Essential (primary) hypertension: Secondary | ICD-10-CM

## 2011-11-09 DIAGNOSIS — I359 Nonrheumatic aortic valve disorder, unspecified: Secondary | ICD-10-CM

## 2011-11-09 DIAGNOSIS — E039 Hypothyroidism, unspecified: Secondary | ICD-10-CM

## 2011-11-09 DIAGNOSIS — I350 Nonrheumatic aortic (valve) stenosis: Secondary | ICD-10-CM

## 2011-11-09 NOTE — Assessment & Plan Note (Signed)
Will continue to follow She has occasional lightheadedness, I will not adjust meds at this time

## 2011-11-09 NOTE — Assessment & Plan Note (Signed)
Clinically doing well No need to change meds at this time

## 2011-11-09 NOTE — Progress Notes (Signed)
Patient ID: Susan [name], female   DOB: 1919-05-03, 78 y.o.   MRN: [id number] AS--reviewed Echo  Diarrhea--has colonoscopy scheduled  htn--tolerating meds  Past Medical History  Diagnosis Date  . CARCINOMA, BLADDER, TRANSITIONAL CELL 10/24/2007  . HYPERTENSION 01/07/2007  . HYPOTHYROIDISM 01/07/2007  . IBS 05/03/2008  . MIXED HEARING LOSS BILATERAL 09/28/2009  . OSTEOARTHRITIS, KNEE 04/25/2007  . UNSPECIFIED ARTHROPATHY MULTIPLE SITES 10/26/2009  . Heart murmur     History   Social History  . Marital Status: Widowed    Spouse Name: N/A    Number of Children: 3  . Years of Education: N/A   Occupational History  . retired    Social History Main Topics  . Smoking status: Former Smoker -- 0.5 packs/day for 20 years    Types: Cigarettes    Quit date: 09/05/1973  . Smokeless tobacco: Never Used  . Alcohol Use: Yes     ocass  . Drug Use: No  . Sexually Active: Not on file   Other Topics Concern  . Not on file   Social History Narrative  . No narrative on file    Past Surgical History  Procedure Date  . Cataract extraction     bilateral  . Bunionectomy     bilateral  . Bladder tumor excision   . Tonsillectomy and adenoidectomy     x2  . Belpharoptosis repair     Family History  Problem Relation Age of Onset  . Stomach cancer Mother   . Leukemia Father   . Kidney failure Father   . Arthritis Mother   . Anuerysm Brother     brain  . ALS Sister     Allergies  Allergen Reactions  . Ace Inhibitors     REACTION: cough  . Aspirin   . Hydrocodone     REACTION: Hallucinations---minor    Current Outpatient Prescriptions on File Prior to Visit  Medication Sig Dispense Refill  . acetaminophen (TYLENOL) 500 MG tablet Take 1,000 mg by mouth Michon. And 1 at bedtime       . calcium carbonate (TUMS) 500 MG chewable tablet Chew 1 tablet by mouth as needed.        . carvedilol (COREG) 12.5 MG tablet Take 0.5 tablets (6.25 mg total) by mouth 2 (two) times Anagnos with a  meal.  60 tablet  1  . diphenoxylate-atropine (LOMOTIL) 2.5-0.025 MG per tablet Take 1-2 tablets by mouth Fedorko as needed for diarrhea/loose stools.  60 tablet  6  . levothyroxine (SYNTHROID, LEVOTHROID) 75 MCG tablet 1 BY MOUTH Kantner  90 tablet  3  . loperamide (IMODIUM A-D) 2 MG tablet Take 4 mg by mouth Schillaci.        . Multiple Vitamin (MULTIVITAMIN) tablet Take 1 tablet by mouth Groninger.           patient denies chest pain, shortness of breath, orthopnea. Denies lower extremity edema, abdominal pain, change in appetite, change in bowel movements. Patient denies rashes, musculoskeletal complaints. No other specific complaints in a complete review of systems.   BP 154/80  Pulse 88  Temp(Src) 98.7 F (37.1 C) (Oral)  Wt 169 lb (76.658 kg)  Well-developed well-nourished female in no acute distress. HEENT exam atraumatic, normocephalic, extraocular muscles are intact. Neck is supple. No jugular venous distention no thyromegaly. Chest clear to auscultation without increased work of breathing. Cardiac exam S1 and S2 are regular 2-3/6 SEM. Abdominal exam active bowel sounds, soft, nontender. Extremities no edema. Neurologic exam she  is alert without any motor sensory deficits. Gait is normal.

## 2011-11-09 NOTE — Assessment & Plan Note (Signed)
Well controlled, continue meds 

## 2011-11-14 ENCOUNTER — Ambulatory Visit (AMBULATORY_SURGERY_CENTER): Payer: Medicare Other | Admitting: *Deleted

## 2011-11-14 VITALS — Ht 60.5 in | Wt 170.2 lb

## 2011-11-14 DIAGNOSIS — R197 Diarrhea, unspecified: Secondary | ICD-10-CM

## 2011-11-14 MED ORDER — PEG-KCL-NACL-NASULF-NA ASC-C 100 G PO SOLR: 1.0000 | Freq: Once | ORAL | Status: DC

## 2011-11-15 DIAGNOSIS — H04129 Dry eye syndrome of unspecified lacrimal gland: Secondary | ICD-10-CM | POA: Diagnosis not present

## 2011-11-15 DIAGNOSIS — H35369 Drusen (degenerative) of macula, unspecified eye: Secondary | ICD-10-CM | POA: Diagnosis not present

## 2011-11-15 DIAGNOSIS — Z961 Presence of intraocular lens: Secondary | ICD-10-CM | POA: Diagnosis not present

## 2011-11-23 ENCOUNTER — Ambulatory Visit (AMBULATORY_SURGERY_CENTER): Payer: Medicare Other | Admitting: Gastroenterology

## 2011-11-23 ENCOUNTER — Encounter: Payer: Self-pay | Admitting: Gastroenterology

## 2011-11-23 VITALS — BP 152/82 | HR 83 | Temp 100.2°F | Resp 16 | Ht 65.0 in | Wt 170.0 lb

## 2011-11-23 DIAGNOSIS — R197 Diarrhea, unspecified: Secondary | ICD-10-CM

## 2011-11-23 DIAGNOSIS — D126 Benign neoplasm of colon, unspecified: Secondary | ICD-10-CM

## 2011-11-23 DIAGNOSIS — K5730 Diverticulosis of large intestine without perforation or abscess without bleeding: Secondary | ICD-10-CM

## 2011-11-23 DIAGNOSIS — K644 Residual hemorrhoidal skin tags: Secondary | ICD-10-CM

## 2011-11-23 MED ORDER — SODIUM CHLORIDE 0.9 % IV SOLN: 500.0000 mL | INTRAVENOUS | Status: DC

## 2011-11-23 NOTE — Op Note (Signed)
[facility] [address] [place], [place]  [address]  COLONOSCOPY PROCEDURE REPORT  PATIENT:  [name], Susan  MR#:  [id number] BIRTHDATE:  May 01, 1919, 89 yrs. old  GENDER:  female ENDOSCOPIST:  Rachael Fee, MD REF. BY:  Birdie Sons, M.D. PROCEDURE DATE:  11/23/2011 PROCEDURE:  Colonoscopy with biopsy and snare polypectomy ASA CLASS:  Class II INDICATIONS:  diarrhea, daughter with colon cancer MEDICATIONS:   Fentanyl 25 mcg IV, These medications were titrated to patient response per physician's verbal order, Versed 3 mg IV  DESCRIPTION OF PROCEDURE:   After the risks benefits and alternatives of the procedure were thoroughly explained, informed consent was obtained.  Digital rectal exam was performed and revealed no rectal masses.   The LB [id number] endoscope was introduced through the anus and advanced to the terminal ileum which was intubated for a short distance, without limitations. The quality of the prep was good..  The instrument was then slowly withdrawn as the colon was fully examined. <<PROCEDUREIMAGES>> FINDINGS:  12-15 small sessile polyps were found scattered throughout all segments of colon. These were all removed with cold snare, ranged in size from 2-47mm, sent in two jars (labeled right and left colon) (see image3, image4, and image5).  The terminal ileum appeared normal (see image2).  Mild diverticulosis was found in the sigmoid to descending colon segments.  External Hemorrhoids were found.  This was otherwise a normal examination of the colon (see image7 and image1).  Randomly biopsied and sent to pathology. Retroflexed views in the rectum revealed no abnormalities. COMPLICATIONS:  None  ENDOSCOPIC IMPRESSION: 1) 12-15 small polyps, all removed and all sent to pathology 2) Normal terminal ileum 3) Mild diverticulosis in the sigmoid to descending colon segments 4) External hemorrhoids 5) Otherwise normal examination; randomly  biopsied to check for microscopic colitis.  RECOMMENDATIONS: 1) Await biopsy results. If no microscopic colitis, then likely will recommend cholestyramine trial  ______________________________ Rachael Fee, MD  n. eSIGNED:   Rachael Fee at 11/23/2011 02:56 PM  Dahmer, Scarlette Calico, [phone]

## 2011-11-23 NOTE — Patient Instructions (Signed)
Discharge instructions given with verbal understanding. Handouts on polyps,diverticulosis and hemorrhoids given. Resume previous medications. YOU HAD AN ENDOSCOPIC PROCEDURE TODAY AT THE [facility]: Refer to the procedure report that was given to you for any specific questions about what was found during the examination.  If the procedure report does not answer your questions, please call your gastroenterologist to clarify.  If you requested that your care partner not be given the details of your procedure findings, then the procedure report has been included in a sealed envelope for you to review at your convenience later.  YOU SHOULD EXPECT: Some feelings of bloating in the abdomen. Passage of more gas than usual.  Walking can help get rid of the air that was put into your GI tract during the procedure and reduce the bloating. If you had a lower endoscopy (such as a colonoscopy or flexible sigmoidoscopy) you may notice spotting of blood in your stool or on the toilet paper. If you underwent a bowel prep for your procedure, then you may not have a normal bowel movement for a few days.  DIET: Your first meal following the procedure should be a light meal and then it is ok to progress to your normal diet.  A half-sandwich or bowl of soup is an example of a good first meal.  Heavy or fried foods are harder to digest and may make you feel nauseous or bloated.  Likewise meals heavy in dairy and vegetables can cause extra gas to form and this can also increase the bloating.  Drink plenty of fluids but you should avoid alcoholic beverages for 24 hours.  ACTIVITY: Your care partner should take you home directly after the procedure.  You should plan to take it easy, moving slowly for the rest of the day.  You can resume normal activity the day after the procedure however you should NOT DRIVE or use heavy machinery for 24 hours (because of the sedation medicines used during the test).    SYMPTOMS TO  REPORT IMMEDIATELY: A gastroenterologist can be reached at any hour.  During normal business hours, 8:30 AM to 5:00 PM Monday through Friday, call [phone].  After hours and on weekends, please call the GI answering service at [phone] who will take a message and have the physician on call contact you.   Following lower endoscopy (colonoscopy or flexible sigmoidoscopy):  Excessive amounts of blood in the stool  Significant tenderness or worsening of abdominal pains  Swelling of the abdomen that is new, acute  Fever of 100F or higher  FOLLOW UP: If any biopsies were taken you will be contacted by phone or by letter within the next 1-3 weeks.  Call your gastroenterologist if you have not heard about the biopsies in 3 weeks.  Our staff will call the home number listed on your records the next business day following your procedure to check on you and address any questions or concerns that you may have at that time regarding the information given to you following your procedure. This is a courtesy call and so if there is no answer at the home number and we have not heard from you through the emergency physician on call, we will assume that you have returned to your regular [facility] activities without incident.  SIGNATURES/CONFIDENTIALITY: You and/or your care partner have signed paperwork which will be entered into your electronic medical record.  These signatures attest to the fact that that the information above on your After Visit   Summary has been reviewed and is understood.  Full responsibility of the confidentiality of this discharge information lies with you and/or your care-partner.  

## 2011-11-23 NOTE — Progress Notes (Signed)
PATIENT'S TEMPERATURE 100.2(RECHECKED X 2). PATIENT  DENIES ANY SYMPTOMS OF RESPIRATORY INFECTION,SORE THROAT, GI SYMPTOMS ,DENIES SYMPTOMS OF UTI. PATIENT STATING SHE IS "A LITTLE QUEASY, BUT I  TOOK MY BP MEDICATION ON AN EMPTY STOMACH." INFORMED DR. Christella Hartigan OF ABOVE. WILL PROCEED WITH PROCEDURE. 1405 RECHECKED TEMPERATURE  99 AT THIS TIME.

## 2011-11-23 NOTE — Progress Notes (Signed)
Patient did not experience any of the following events: a burn prior to discharge; a fall within the facility; wrong site/side/patient/procedure/implant event; or a hospital transfer or hospital admission upon discharge from the facility. (G8907) Patient did not have preoperative order for IV antibiotic SSI prophylaxis. (G8918)  

## 2011-11-26 ENCOUNTER — Telehealth: Payer: Self-pay | Admitting: *Deleted

## 2011-11-26 NOTE — Telephone Encounter (Signed)
  Follow up Call-  Call back number 11/23/2011  Post procedure Call Back phone  # [phone]  Permission to leave phone message Yes     Patient questions:  Do you have a fever, pain , or abdominal swelling? no Pain Score  0 *  Have you tolerated food without any problems? yes  Have you been able to return to your normal activities? yes  Do you have any questions about your discharge instructions: Diet   no Medications  no Follow up visit  no  Do you have questions or concerns about your Care? no  Actions: * If pain score is 4 or above: No action needed, pain <4.

## 2011-12-04 ENCOUNTER — Other Ambulatory Visit: Payer: Self-pay

## 2011-12-04 MED ORDER — CHOLESTYRAMINE 4 GM/DOSE PO POWD: 4.0000 g | Freq: Every day | ORAL | Status: DC

## 2011-12-04 NOTE — Telephone Encounter (Signed)
Medication sent to the pharmacy and ROV scheduled see path result note

## 2011-12-11 DIAGNOSIS — M1710 Unilateral primary osteoarthritis, unspecified knee: Secondary | ICD-10-CM | POA: Diagnosis not present

## 2011-12-15 ENCOUNTER — Other Ambulatory Visit: Payer: Self-pay | Admitting: Internal Medicine

## 2012-01-02 ENCOUNTER — Ambulatory Visit (INDEPENDENT_AMBULATORY_CARE_PROVIDER_SITE_OTHER): Payer: Medicare Other | Admitting: Gastroenterology

## 2012-01-02 ENCOUNTER — Other Ambulatory Visit: Payer: Self-pay | Admitting: Dermatology

## 2012-01-02 ENCOUNTER — Encounter: Payer: Self-pay | Admitting: Gastroenterology

## 2012-01-02 VITALS — BP 144/72 | HR 70 | Ht 61.0 in | Wt 165.0 lb

## 2012-01-02 DIAGNOSIS — L723 Sebaceous cyst: Secondary | ICD-10-CM | POA: Diagnosis not present

## 2012-01-02 DIAGNOSIS — R197 Diarrhea, unspecified: Secondary | ICD-10-CM

## 2012-01-02 DIAGNOSIS — L820 Inflamed seborrheic keratosis: Secondary | ICD-10-CM | POA: Diagnosis not present

## 2012-01-02 DIAGNOSIS — D239 Other benign neoplasm of skin, unspecified: Secondary | ICD-10-CM | POA: Diagnosis not present

## 2012-01-02 DIAGNOSIS — C44529 Squamous cell carcinoma of skin of other part of trunk: Secondary | ICD-10-CM | POA: Diagnosis not present

## 2012-01-02 NOTE — Patient Instructions (Addendum)
Take cholestyramine powder every night. Take a single imodium in AM every day. Take 1-2 lomotil as needed throughout the day. Return to see  Dr. Christella Hartigan as needed.

## 2012-01-02 NOTE — Progress Notes (Signed)
Review of pertinent gastrointestinal problems:  1. Colonic adenomas. Colonoscopy 2006 by Dr. Vilinda Boehringer removed 3 subcentimeter adenomas. Usual followup would be at 3 year interval however given her age we decided not to repeat colonoscopy as yet. She is also very uninterested in repeat colonoscopy. 11/2011 colonoscopy mulitple small TAs all removed, TI normal, normal mucosa biopsied, no microsopic colitis, added questran with very good response 2. Mild, chronic diarrhea: 2011, 2012. Imodium, Lomotil both help  HPI: This is a  very pleasant 78 year old woman whom I last saw about 5 or 6 weeks ago.  On cholestyramine 5grm Osso, taking it at night time before bed. Helping her bowels. Only goes twice a day.  Usually in early AM.  Much more solid stools.  Getting out more with better control.  Past Medical History  Diagnosis Date  . CARCINOMA, BLADDER, TRANSITIONAL CELL 10/24/2007  . HYPERTENSION 01/07/2007  . HYPOTHYROIDISM 01/07/2007  . IBS 05/03/2008  . MIXED HEARING LOSS BILATERAL 09/28/2009  . OSTEOARTHRITIS, KNEE 04/25/2007  . UNSPECIFIED ARTHROPATHY MULTIPLE SITES 10/26/2009  . Heart murmur     Past Surgical History  Procedure Date  . Cataract extraction     bilateral  . Bunionectomy     bilateral  . Bladder tumor excision   . Tonsillectomy and adenoidectomy     x2  . Belpharoptosis repair     Current Outpatient Prescriptions  Medication Sig Dispense Refill  . acetaminophen (TYLENOL) 500 MG tablet Take 1,000 mg by mouth Covello. And 1 at bedtime       . calcium carbonate (TUMS) 500 MG chewable tablet Chew 1 tablet by mouth as needed.        . carvedilol (COREG) 12.5 MG tablet Take 0.5 tablets (6.25 mg total) by mouth 2 (two) times Saine with a meal.  60 tablet  1  . carvedilol (COREG) 12.5 MG tablet TAKE 1 TABLET (12.5 MG TOTAL) BY MOUTH 2 (TWO) TIMES Marquina WITH A MEAL.  60 tablet  3  . cholestyramine (QUESTRAN) 4 GM/DOSE powder Take 1 packet (4 g total) by mouth Koons.  378 g  2    . diphenoxylate-atropine (LOMOTIL) 2.5-0.025 MG per tablet Take 1-2 tablets by mouth Boza as needed for diarrhea/loose stools.  60 tablet  6  . levothyroxine (SYNTHROID, LEVOTHROID) 75 MCG tablet 1 BY MOUTH Laughter  90 tablet  3  . loperamide (IMODIUM A-D) 2 MG tablet Take 4 mg by mouth Tousley.        . Multiple Vitamin (MULTIVITAMIN) tablet Take 1 tablet by mouth Fels.          Allergies as of 01/02/2012 - Review Complete 01/02/2012  Allergen Reaction Noted  . Ace inhibitors  07/19/2008  . Aspirin  10/26/2011  . Hydrocodone  06/09/2008    Family History  Problem Relation Age of Onset  . Stomach cancer Mother   . Arthritis Mother   . Leukemia Father   . Kidney failure Father   . Anuerysm Brother     brain  . ALS Sister   . Stomach cancer Maternal Aunt   . Esophageal cancer Neg Hx   . Rectal cancer Neg Hx   . Colon cancer Daughter     History   Social History  . Marital Status: Widowed    Spouse Name: N/A    Number of Children: 3  . Years of Education: N/A   Occupational History  . retired    Social History Main Topics  . Smoking status: Former Smoker --  0.5 packs/day for 20 years    Types: Cigarettes    Quit date: 09/05/1973  . Smokeless tobacco: Never Used  . Alcohol Use: Yes     ocass  . Drug Use: No  . Sexually Active: Not on file   Other Topics Concern  . Not on file   Social History Narrative  . No narrative on file      Physical Exam: BP 144/72  Pulse 70  Ht 5\' 1"  (1.549 m)  Wt 165 lb (74.844 kg)  BMI 31.18 kg/m2 Constitutional: generally well-appearing Psychiatric: alert and oriented x3 Abdomen: soft, nontender, nondistended, no obvious ascites, no peritoneal signs, normal bowel sounds     Assessment and plan: 78 y.o. female with chronic loose stools  She has clearly improved on combination of cholestyramine, Imodium and Lomotil when necessary. She will continue this regimen and no significant touch if things worsen.

## 2012-01-14 ENCOUNTER — Telehealth: Payer: Self-pay | Admitting: Gastroenterology

## 2012-01-14 NOTE — Telephone Encounter (Signed)
Pt is calling because she has become constipated on her meds for loose stools she was advised to stop the imodium and lomotil and see if that helps and decrease the cholestyramine to every other night and if she develops the loose stools again she can return to the imodium and lomotil.  She will call if her symptoms do not improve.

## 2012-01-14 NOTE — Telephone Encounter (Signed)
i agree, thanks 

## 2012-02-13 DIAGNOSIS — C44529 Squamous cell carcinoma of skin of other part of trunk: Secondary | ICD-10-CM | POA: Diagnosis not present

## 2012-03-06 DIAGNOSIS — M1710 Unilateral primary osteoarthritis, unspecified knee: Secondary | ICD-10-CM | POA: Diagnosis not present

## 2012-04-22 DIAGNOSIS — Z23 Encounter for immunization: Secondary | ICD-10-CM | POA: Diagnosis not present

## 2012-05-12 ENCOUNTER — Ambulatory Visit (INDEPENDENT_AMBULATORY_CARE_PROVIDER_SITE_OTHER): Payer: Medicare Other | Admitting: Internal Medicine

## 2012-05-12 ENCOUNTER — Encounter: Payer: Self-pay | Admitting: Internal Medicine

## 2012-05-12 VITALS — BP 176/84 | HR 80 | Temp 98.0°F | Wt 168.0 lb

## 2012-05-12 DIAGNOSIS — I10 Essential (primary) hypertension: Secondary | ICD-10-CM

## 2012-05-12 DIAGNOSIS — I359 Nonrheumatic aortic valve disorder, unspecified: Secondary | ICD-10-CM | POA: Diagnosis not present

## 2012-05-12 DIAGNOSIS — Z23 Encounter for immunization: Secondary | ICD-10-CM

## 2012-05-12 DIAGNOSIS — I350 Nonrheumatic aortic (valve) stenosis: Secondary | ICD-10-CM

## 2012-05-12 MED ORDER — CARVEDILOL 12.5 MG PO TABS: 12.5000 mg | ORAL_TABLET | Freq: Two times a day (BID) | ORAL | Status: DC

## 2012-05-12 NOTE — Patient Instructions (Signed)
Monitor your blood pressure 2 times weekly. Record results

## 2012-05-12 NOTE — Assessment & Plan Note (Signed)
BP Readings from Last 3 Encounters:  05/12/12 176/84  01/02/12 144/72  11/23/11 152/82   Not as well controlled

## 2012-05-12 NOTE — Progress Notes (Signed)
Patient ID: Susan [name], female   DOB: June 18, 1919, 78 y.o.   MRN: [id number] HTN-- NO HOME BPS  OA back and knees-- she is using walker for balance and getting steroid injections in knees  Aortic Stenosis-- no Sxs  Past Medical History  Diagnosis Date  . CARCINOMA, BLADDER, TRANSITIONAL CELL 10/24/2007  . HYPERTENSION 01/07/2007  . HYPOTHYROIDISM 01/07/2007  . IBS 05/03/2008  . MIXED HEARING LOSS BILATERAL 09/28/2009  . OSTEOARTHRITIS, KNEE 04/25/2007  . UNSPECIFIED ARTHROPATHY MULTIPLE SITES 10/26/2009  . Heart murmur     History   Social History  . Marital Status: Widowed    Spouse Name: N/A    Number of Children: 3  . Years of Education: N/A   Occupational History  . retired    Social History Main Topics  . Smoking status: Former Smoker -- 0.5 packs/day for 20 years    Types: Cigarettes    Quit date: 09/05/1973  . Smokeless tobacco: Never Used  . Alcohol Use: Yes     Comment: ocass  . Drug Use: No  . Sexually Active: Not on file   Other Topics Concern  . Not on file   Social History Narrative  . No narrative on file    Past Surgical History  Procedure Date  . Cataract extraction     bilateral  . Bunionectomy     bilateral  . Bladder tumor excision   . Tonsillectomy and adenoidectomy     x2  . Belpharoptosis repair     Family History  Problem Relation Age of Onset  . Stomach cancer Mother   . Arthritis Mother   . Leukemia Father   . Kidney failure Father   . Anuerysm Brother     brain  . ALS Sister   . Stomach cancer Maternal Aunt   . Esophageal cancer Neg Hx   . Rectal cancer Neg Hx   . Colon cancer Daughter     Allergies  Allergen Reactions  . Ace Inhibitors     REACTION: cough  . Aspirin   . Hydrocodone     REACTION: Hallucinations---minor    Current Outpatient Prescriptions on File Prior to Visit  Medication Sig Dispense Refill  . acetaminophen (TYLENOL) 500 MG tablet Take 1,000 mg by mouth Stmarie. And 1 at bedtime       . calcium  carbonate (TUMS) 500 MG chewable tablet Chew 1 tablet by mouth as needed.        . cholestyramine (QUESTRAN) 4 GM/DOSE powder Take 1 packet (4 g total) by mouth Holstine.  378 g  2  . diphenoxylate-atropine (LOMOTIL) 2.5-0.025 MG per tablet Take 1-2 tablets by mouth Sundberg as needed for diarrhea/loose stools.  60 tablet  6  . levothyroxine (SYNTHROID, LEVOTHROID) 75 MCG tablet 1 BY MOUTH Gurr  90 tablet  3  . loperamide (IMODIUM A-D) 2 MG tablet Take 4 mg by mouth Narvaiz.        . Multiple Vitamin (MULTIVITAMIN) tablet Take 1 tablet by mouth Armel.        . [DISCONTINUED] carvedilol (COREG) 12.5 MG tablet Take 0.5 tablets (6.25 mg total) by mouth 2 (two) times Minchew with a meal.  60 tablet  1  . [DISCONTINUED] carvedilol (COREG) 12.5 MG tablet TAKE 1 TABLET (12.5 MG TOTAL) BY MOUTH 2 (TWO) TIMES Woolverton WITH A MEAL.  60 tablet  3     patient denies chest pain, shortness of breath, orthopnea. Denies lower extremity edema, abdominal pain, change in appetite,  change in bowel movements. Patient denies rashes, musculoskeletal complaints. No other specific complaints in a complete review of systems.   BP 176/84  Pulse 80  Temp 98 F (36.7 C) (Oral)  Wt 168 lb (76.204 kg)  Well-developed well-nourished female in no acute distress. HEENT exam atraumatic, normocephalic, extraocular muscles are intact. Neck is supple. No jugular venous distention no thyromegaly. Chest clear to auscultation without increased work of breathing. Cardiac exam S1 and S2 are regular. 3/6 SEM Abdominal exam active bowel sounds, soft, nontender. Extremities no edema. Neurologic exam she is alert without any motor sensory deficits. Using walker

## 2012-05-12 NOTE — Assessment & Plan Note (Signed)
She has f/u with dr. Antoine Poche No significant sxs attributable to AS

## 2012-05-14 ENCOUNTER — Telehealth: Payer: Self-pay | Admitting: Cardiology

## 2012-05-14 NOTE — Telephone Encounter (Signed)
Pt per call - she woke last night from her sleep with a chest pain between her breast that lasted about 2 hours.  There was no radiation, no SOB and no n/v.  She thinks it lasted about 2 hours but can't be sure because she was dozing off and on.  She has not had this kind of pain/discomfort before.  It resolved on its own.  She has had no complaints today.  No chest pain, SOB, fatigue weakness etc.  She wants Dr Antoine Poche to be aware. Instructed pt I will forward the information to him and call back with any recommendations.  She is aware to call back if the pain reoccurs.  She will call 911 if it occurs without relief.

## 2012-05-14 NOTE — Telephone Encounter (Signed)
plz return call to pt [phone] regarding chest discomfort during the night.

## 2012-05-15 NOTE — Telephone Encounter (Signed)
Please offer her follow up with one of our extenders.

## 2012-05-16 ENCOUNTER — Other Ambulatory Visit: Payer: Self-pay

## 2012-05-16 MED ORDER — DIPHENOXYLATE-ATROPINE 2.5-0.025 MG PO TABS: 1.0000 | ORAL_TABLET | Freq: Every day | ORAL | Status: DC | PRN

## 2012-05-19 NOTE — Telephone Encounter (Signed)
Per pt - she is not having and has not had any more problems or complaints.  States she feels good.  She is scheduled for 2 D Echo and f/u appt with Dr Antoine Poche on 12/9

## 2012-05-27 DIAGNOSIS — M1710 Unilateral primary osteoarthritis, unspecified knee: Secondary | ICD-10-CM | POA: Diagnosis not present

## 2012-06-11 DIAGNOSIS — Z85828 Personal history of other malignant neoplasm of skin: Secondary | ICD-10-CM | POA: Diagnosis not present

## 2012-06-11 DIAGNOSIS — L820 Inflamed seborrheic keratosis: Secondary | ICD-10-CM | POA: Diagnosis not present

## 2012-06-16 ENCOUNTER — Ambulatory Visit: Payer: Medicare Other | Admitting: Cardiology

## 2012-06-16 ENCOUNTER — Other Ambulatory Visit (HOSPITAL_COMMUNITY): Payer: Medicare Other

## 2012-06-27 ENCOUNTER — Encounter: Payer: Self-pay | Admitting: Cardiology

## 2012-06-27 ENCOUNTER — Ambulatory Visit (HOSPITAL_COMMUNITY): Payer: Medicare Other | Attending: Cardiology | Admitting: Radiology

## 2012-06-27 ENCOUNTER — Ambulatory Visit (INDEPENDENT_AMBULATORY_CARE_PROVIDER_SITE_OTHER): Payer: Medicare Other | Admitting: Cardiology

## 2012-06-27 VITALS — BP 180/79 | HR 69 | Ht 61.0 in | Wt 166.1 lb

## 2012-06-27 DIAGNOSIS — I10 Essential (primary) hypertension: Secondary | ICD-10-CM | POA: Diagnosis not present

## 2012-06-27 DIAGNOSIS — I079 Rheumatic tricuspid valve disease, unspecified: Secondary | ICD-10-CM | POA: Insufficient documentation

## 2012-06-27 DIAGNOSIS — I359 Nonrheumatic aortic valve disorder, unspecified: Secondary | ICD-10-CM | POA: Diagnosis not present

## 2012-06-27 DIAGNOSIS — R011 Cardiac murmur, unspecified: Secondary | ICD-10-CM | POA: Diagnosis not present

## 2012-06-27 DIAGNOSIS — R42 Dizziness and giddiness: Secondary | ICD-10-CM | POA: Insufficient documentation

## 2012-06-27 DIAGNOSIS — I517 Cardiomegaly: Secondary | ICD-10-CM | POA: Insufficient documentation

## 2012-06-27 DIAGNOSIS — Z8551 Personal history of malignant neoplasm of bladder: Secondary | ICD-10-CM | POA: Diagnosis not present

## 2012-06-27 DIAGNOSIS — R079 Chest pain, unspecified: Secondary | ICD-10-CM

## 2012-06-27 DIAGNOSIS — E669 Obesity, unspecified: Secondary | ICD-10-CM | POA: Diagnosis not present

## 2012-06-27 NOTE — Patient Instructions (Addendum)
The current medical regimen is effective;  continue present plan and medications.  Your physician has requested that you have a lexiscan myoview. For further information please visit [email or web address]. Please follow instruction sheet, as given.  Follow up in 3 months with Dr Hochrein. 

## 2012-06-27 NOTE — Progress Notes (Signed)
HPI The patient presents for follow up of AS.  Since I last saw her she has had no new SOB or dizziness.  She gets some mild orthostasis.  Her BP has been elevated.  She recently had her beta blocker dose increased.  She has had some chest pain.  She reports that this was happening a few weeks ago that seemed to resolve somewhat. However, she had a more significant episode earlier this week. They all seem to be while she is lying down. They do not happen with her activity which is walking to dinner at her nursing home .   She does describe an aching discomfort in her mid chest and the aorta no associated symptoms such as not see a diaphoresis. There are no palpitations, presyncope or syncope. She has no new PND or orthopnea. She has had episodes where her left arm the he is asleep.    Allergies  Allergen Reactions  . Ace Inhibitors     REACTION: cough  . Aspirin   . Hydrocodone     REACTION: Hallucinations---minor    Current Outpatient Prescriptions  Medication Sig Dispense Refill  . acetaminophen (TYLENOL) 500 MG tablet Take 1,000 mg by mouth Ovando. And 1 at bedtime       . calcium carbonate (TUMS) 500 MG chewable tablet Chew 1 tablet by mouth as needed.        . carvedilol (COREG) 12.5 MG tablet Take 1 tablet (12.5 mg total) by mouth 2 (two) times [place] with a meal.  180 tablet  3  . cholestyramine (QUESTRAN) 4 GM/DOSE powder Take 1 packet (4 g total) by mouth Marines.  378 g  2  . diphenoxylate-atropine (LOMOTIL) 2.5-0.025 MG per tablet Take 1-2 tablets by mouth Sebald as needed for diarrhea or loose stools.  60 tablet  2  . levothyroxine (SYNTHROID, LEVOTHROID) 75 MCG tablet 1 BY MOUTH Frid  90 tablet  3  . loperamide (IMODIUM A-D) 2 MG tablet Take 4 mg by mouth Madril.        . Multiple Vitamin (MULTIVITAMIN) tablet Take 1 tablet by mouth Birkeland.          Past Medical History  Diagnosis Date  . CARCINOMA, BLADDER, TRANSITIONAL CELL 10/24/2007  . HYPERTENSION 01/07/2007  .  HYPOTHYROIDISM 01/07/2007  . IBS 05/03/2008  . MIXED HEARING LOSS BILATERAL 09/28/2009  . OSTEOARTHRITIS, KNEE 04/25/2007  . UNSPECIFIED ARTHROPATHY MULTIPLE SITES 10/26/2009  . Heart murmur     Past Surgical History  Procedure Date  . Cataract extraction     bilateral  . Bunionectomy     bilateral  . Bladder tumor excision   . Tonsillectomy and adenoidectomy     x2  . Belpharoptosis repair     ROS: As stated in the HPI and negative for all other systems.  PHYSICAL EXAM BP 180/79  Pulse 69  Ht 5\' 1"  (1.549 m)  Wt 166 lb 1.9 oz (75.352 kg)  BMI 31.39 kg/m2 GENERAL:  Well appearing for her age HEENT:  Pupils equal round and reactive, fundi not visualized, oral mucosa unremarkable NECK:  No jugular venous distention, waveform within normal limits, carotid upstroke brisk and symmetric, bilateral bruits bruits, no thyromegaly LYMPHATICS:  No cervical, inguinal adenopathy LUNGS:  Clear to auscultation bilaterally BACK:  No CVA tenderness CHEST:  Unremarkable HEART:  PMI not displaced or sustained,S1 and S2 within normal limits, no S3, no S4, no clicks, no rubs, apical systolic murmur mid peaking radiating out the outflow  tract ABD:  Flat, positive bowel sounds normal in frequency in pitch, no bruits, no rebound, no guarding, no midline pulsatile mass, no hepatomegaly, no splenomegaly EXT:  2 plus pulses throughout, no edema, no cyanosis no clubbing  EKG:  Sinus rhythm, rate 74, axis within normal limits, intervals within normal limits, no acute ST-T wave changes.  06/27/2012   ASSESSMENT AND PLAN   Aortic stenosis - I reviewed with her the echo done today. This is unchanged from previous. I will image this again likely in one year.  Chest pain - This has some typical and atypical features. I talked with her about this for quite a while. I would like to screen this with a stress test. She would not be a little walk on a treadmill so she will have a  Lexiscan Myoview.  HTN  - Her blood pressure is elevated today but she says this was checked recently by Dr. Cato Mulligan. He increase her beta blocker. Her blood pressure she reports has been better controlled at home. She will keep her blood pressure diary.

## 2012-06-27 NOTE — Progress Notes (Signed)
Echocardiogram performed.  

## 2012-07-11 ENCOUNTER — Encounter: Payer: Self-pay | Admitting: Cardiology

## 2012-07-11 ENCOUNTER — Telehealth: Payer: Self-pay | Admitting: *Deleted

## 2012-07-11 NOTE — Telephone Encounter (Signed)
Please call her and tell her that these are OK and no change in therapy.    ----- Message -----   From: Susan Odea, RN   Sent: 07/11/2012 11:27 AM   To: Rollene Rotunda, MD, Rocco Serene, RN   Subject: [name]: Visit Follow-Up Question       Will forward to dr/nurse   ----- Message -----   From: Susan [name]   Sent: 07/11/2012 11:19 AM   To: Donne [facility]el Triage   Subject: Visit Follow-Up Question       Dr. Antoine Poche requested blood pressure results for several days. They were taken at the clinic at [facility].      06/30/12 146 76   07/03/12 138 70   07/07/12 138 68   07/08/12 136 70   07/10/12 146 70     Patient informed.

## 2012-07-14 ENCOUNTER — Ambulatory Visit (HOSPITAL_COMMUNITY): Payer: Medicare Other | Attending: Cardiology | Admitting: Radiology

## 2012-07-14 ENCOUNTER — Telehealth: Payer: Self-pay | Admitting: *Deleted

## 2012-07-14 VITALS — BP 142/81 | HR 78 | Ht 61.0 in | Wt 167.0 lb

## 2012-07-14 DIAGNOSIS — R079 Chest pain, unspecified: Secondary | ICD-10-CM | POA: Insufficient documentation

## 2012-07-14 DIAGNOSIS — E669 Obesity, unspecified: Secondary | ICD-10-CM | POA: Diagnosis not present

## 2012-07-14 DIAGNOSIS — I10 Essential (primary) hypertension: Secondary | ICD-10-CM | POA: Diagnosis not present

## 2012-07-14 MED ORDER — REGADENOSON 0.4 MG/5ML IV SOLN
0.4000 mg | Freq: Once | INTRAVENOUS | Status: AC
  Administered 2012-07-14: 0.4 mg via INTRAVENOUS

## 2012-07-14 MED ORDER — TECHNETIUM TC 99M SESTAMIBI GENERIC - CARDIOLITE
11.0000 | Freq: Once | INTRAVENOUS | Status: AC | PRN
  Administered 2012-07-14: 11 via INTRAVENOUS

## 2012-07-14 MED ORDER — TECHNETIUM TC 99M SESTAMIBI GENERIC - CARDIOLITE
33.0000 | Freq: Once | INTRAVENOUS | Status: AC | PRN
  Administered 2012-07-14: 33 via INTRAVENOUS

## 2012-07-14 NOTE — Progress Notes (Signed)
  [facility] SITE 3 NUCLEAR MED [address] [place], [place] [address] [phone]    Cardiology Nuclear Med Susan [name] is a 79 y.o. female     MRN : [id number]     DOB: 27-Mar-1919  Procedure Date: 07/14/2012  Nuclear Med Background Indication for Stress Test:  Evaluation for Ischemia History:  06/27/12 Echo:EF=70%, moderate AS, mild-moderate AR Cardiac Risk Factors: Carotid Disease, History of Smoking, Hypertension and Obesity  Symptoms:  Chest Pain/"Dull Ache" with and without Exertion (last episode of chest discomfort was about 2-weeks ago)   Nuclear Pre-Procedure Caffeine/Decaff Intake:  9:00pm NPO After: 8:00am   Lungs:  Clear. O2 Sat: 95% on room air. IV 0.9% NS with Angio Cath:  22g  IV Site: R Hand  IV Started by:  Cathlyn Parsons, RN  Chest Size (in):  42 Cup Size: C  Height: 5\' 1"  (1.549 m)  Weight:  167 lb (75.751 kg)  BMI:  Body mass index is 31.55 kg/(m^2). Tech Comments:  Coreg held this am    Nuclear Med Study 1 or 2 day study: 1 day  Stress Test Type:  Lexiscan  Reading MD: Cassell Clement, MD  Order Authorizing Provider:  Rollene Rotunda, MD  Resting Radionuclide: Technetium 32m Sestamibi  Resting Radionuclide Dose: 11.0 mCi   Stress Radionuclide:  Technetium 16m Sestamibi  Stress Radionuclide Dose: 33.0 mCi           Stress Protocol Rest HR: 78 Stress HR: 104  Rest BP: 142/81 Stress BP: 165/84  Exercise Time (min): n/a METS: n/a   Predicted Max HR: 129 bpm % Max HR: 80.62 bpm Rate Pressure Product: 46962    Dose of Adenosine (mg):  n/a Dose of Lexiscan: 0.4 mg  Dose of Atropine (mg): n/a Dose of Dobutamine: n/a mcg/kg/min (at max HR)  Stress Test Technologist: Smiley Houseman, CMA-N  Nuclear Technologist:  Domenic Polite, CNMT     Rest Procedure:  Myocardial perfusion imaging was performed at rest 45 minutes following the intravenous administration of Technetium 82m Sestamibi.  Rest ECG: NSR - Normal EKG  Stress  Procedure:  The patient received IV Lexiscan 0.4 mg over 15-seconds.  Technetium 4m Sestamibi injected at 30-seconds.  She c/o throat tightness, headache and arms aching.  Quantitative spect images were obtained after a 45 minute delay.  She had nausea and vomiting after IV taken out, that quickly resolved.  Stress ECG: No significant change from baseline ECG  QPS Raw Data Images:  Normal; no motion artifact; normal heart/lung ratio. Stress Images:  Normal homogeneous uptake in all areas of the myocardium. Rest Images:  Normal homogeneous uptake in all areas of the myocardium. Subtraction (SDS):  No evidence of ischemia. Transient Ischemic Dilatation (Normal <1.22):  1.05 Lung/Heart Ratio (Normal <0.45):  0.29  Quantitative Gated Spect Images QGS EDV:  63 ml QGS ESV:  18 ml  Impression Exercise Capacity:  Lexiscan with no exercise. BP Response:  Normal blood pressure response. Clinical Symptoms:  No significant symptoms noted. ECG Impression:  No significant ST segment change suggestive of ischemia. Comparison with Prior Nuclear Study: No previous nuclear study performed  Overall Impression:  Normal stress nuclear study.  LV Ejection Fraction: 70%.  LV Wall Motion:  NL LV Function; NL Wall Motion  [employer]

## 2012-07-14 NOTE — Telephone Encounter (Signed)
Dr. Antoine Poche requested blood pressure results for several days. They were taken at the clinic at [facility].      06/30/12 146 76   07/03/12 138 70   07/07/12 138 68   07/08/12 136 70   07/10/12 146 70            Will forward for review by Dr Cottonwood Lions

## 2012-07-14 NOTE — Telephone Encounter (Signed)
These are OK.  No change in therapy.

## 2012-07-31 ENCOUNTER — Encounter: Payer: Self-pay | Admitting: Cardiology

## 2012-07-31 NOTE — Telephone Encounter (Signed)
error 

## 2012-08-19 ENCOUNTER — Ambulatory Visit (INDEPENDENT_AMBULATORY_CARE_PROVIDER_SITE_OTHER): Payer: Medicare Other | Admitting: Gastroenterology

## 2012-08-19 ENCOUNTER — Ambulatory Visit: Payer: Medicare Other | Admitting: Gastroenterology

## 2012-08-19 ENCOUNTER — Encounter: Payer: Self-pay | Admitting: Gastroenterology

## 2012-08-19 VITALS — BP 132/60 | HR 84 | Ht 61.0 in | Wt 168.5 lb

## 2012-08-19 DIAGNOSIS — R1013 Epigastric pain: Secondary | ICD-10-CM | POA: Diagnosis not present

## 2012-08-19 DIAGNOSIS — K3189 Other diseases of stomach and duodenum: Secondary | ICD-10-CM | POA: Diagnosis not present

## 2012-08-19 NOTE — Patient Instructions (Addendum)
Start omeprazole (generic for prilosec or prevacid). Please start taking one pill 20-30 min before dinner meal every day. Call Dr. Christella Hartigan in 3-4 weeks to report on your symptoms.

## 2012-08-19 NOTE — Progress Notes (Signed)
Review of pertinent gastrointestinal problems:  1. Colonic adenomas. Colonoscopy 2006 by Dr. Vilinda Boehringer removed 3 subcentimeter adenomas. Usual followup would be at 3 year interval however given her age we decided not to repeat colonoscopy as yet. She is also very uninterested in repeat colonoscopy. 11/2011 colonoscopy mulitple small TAs all removed, TI normal, normal mucosa biopsied, no microsopic colitis, added questran with very good response  2. Mild, chronic diarrhea: 2011, 2012. Imodium, Lomotil both help   HPI: This is a  very pleasant 79 year old woman whom I last saw several months ago  She is a bit hard of hearing and walks with a walker but is otherwise impressively healthy for her age, very sharp mentally  Has indigestion shortly after [place].  Usually in PM, maybe laying down or a bit sooner.  She will take tums and that helps very quickely.  Has never tried any PPI or H2 blocker.  Had a stress test that showed no sign of significant CAD.  Minor solid food dysphagia that espeicially with pills, drinks water.     Review of systems: Pertinent positive and negative review of systems were noted in the above HPI section. Complete review of systems was performed and was otherwise normal.    Past Medical History  Diagnosis Date  . CARCINOMA, BLADDER, TRANSITIONAL CELL 10/24/2007  . HYPERTENSION 01/07/2007  . HYPOTHYROIDISM 01/07/2007  . IBS 05/03/2008  . MIXED HEARING LOSS BILATERAL 09/28/2009  . OSTEOARTHRITIS, KNEE 04/25/2007  . UNSPECIFIED ARTHROPATHY MULTIPLE SITES 10/26/2009  . Heart murmur     Past Surgical History  Procedure Laterality Date  . Cataract extraction      bilateral  . Bunionectomy      bilateral  . Bladder tumor excision    . Tonsillectomy and adenoidectomy      x2  . Belpharoptosis repair    . Cardiovascular stress test      Current Outpatient Prescriptions  Medication Sig Dispense Refill  . acetaminophen (TYLENOL) 500 MG tablet Take 1,000 mg  by mouth Rebman. And 1 at bedtime       . calcium carbonate (TUMS) 500 MG chewable tablet Chew 1 tablet by mouth as needed.        . carvedilol (COREG) 12.5 MG tablet Take 1 tablet (12.5 mg total) by mouth 2 (two) times Repsher with a meal.  180 tablet  3  . cholestyramine (QUESTRAN) 4 GM/DOSE powder Take 1 packet (4 g total) by mouth Conly.  378 g  2  . diphenoxylate-atropine (LOMOTIL) 2.5-0.025 MG per tablet Take 1-2 tablets by mouth Sanborn as needed for diarrhea or loose stools.  60 tablet  2  . levothyroxine (SYNTHROID, LEVOTHROID) 75 MCG tablet 1 BY MOUTH Batta  90 tablet  3  . loperamide (IMODIUM A-D) 2 MG tablet Take 4 mg by mouth Aldous.        . Multiple Vitamin (MULTIVITAMIN) tablet Take 1 tablet by mouth Morandi.         No current facility-administered medications for this visit.    Allergies as of 08/19/2012 - Review Complete 08/19/2012  Allergen Reaction Noted  . Ace inhibitors  07/19/2008  . Aspirin  10/26/2011  . Hydrocodone  06/09/2008    Family History  Problem Relation Age of Onset  . Stomach cancer Mother   . Arthritis Mother   . Leukemia Father   . Kidney failure Father   . Anuerysm Brother     brain  . ALS Sister   . Stomach cancer Maternal  Aunt   . Esophageal cancer Neg Hx   . Rectal cancer Neg Hx   . Colon cancer Daughter     History   Social History  . Marital Status: Widowed    Spouse Name: N/A    Number of Children: 3  . Years of Education: N/A   Occupational History  . retired    Social History Main Topics  . Smoking status: Former Smoker -- 0.50 packs/day for 20 years    Types: Cigarettes    Quit date: 09/05/1973  . Smokeless tobacco: Never Used  . Alcohol Use: Yes     Comment: ocass  . Drug Use: No  . Sexually Active: Not on file   Other Topics Concern  . Not on file   Social History Narrative  . No narrative on file       Physical Exam: BP 132/60  Pulse 84  Ht 5\' 1"  (1.549 m)  Wt 168 lb 8 oz (76.431 kg)  BMI 31.85  kg/m2 Constitutional: generally well-appearing Psychiatric: alert and oriented x3 Eyes: extraocular movements intact Mouth: oral pharynx moist, no lesions Neck: supple no lymphadenopathy Cardiovascular: heart regular rate and rhythm Lungs: clear to auscultation bilaterally Abdomen: soft, nontender, nondistended, no obvious ascites, no peritoneal signs, normal bowel sounds Extremities: no lower extremity edema bilaterally Skin: no lesions on visible extremities    Assessment and plan: 79 y.o. female with  GERD, dyspepsia  Her dyspepsia, GERD symptoms could indeed be related to the Questran which she has been taking with good relief of her diarrhea. TUMS helps her indigestion, dyspepsia symptoms very well and so I suspect these are indeed GI related.   I recommended that she start taking one over-the-counter proton pump inhibitor once Zadrozny shortly before her dinner meal every day. Her dinner meal is her biggest meal and the evening time is usually when she has her most indigestion symptoms. She will call to report on her symptom response in 3-4 weeks and sooner if needed.

## 2012-08-23 ENCOUNTER — Other Ambulatory Visit: Payer: Self-pay

## 2012-08-27 DIAGNOSIS — N3942 Incontinence without sensory awareness: Secondary | ICD-10-CM | POA: Diagnosis not present

## 2012-08-27 DIAGNOSIS — Z8551 Personal history of malignant neoplasm of bladder: Secondary | ICD-10-CM | POA: Diagnosis not present

## 2012-09-05 DIAGNOSIS — M1710 Unilateral primary osteoarthritis, unspecified knee: Secondary | ICD-10-CM | POA: Diagnosis not present

## 2012-09-09 ENCOUNTER — Other Ambulatory Visit: Payer: Self-pay | Admitting: Internal Medicine

## 2012-09-15 ENCOUNTER — Telehealth: Payer: Self-pay | Admitting: Gastroenterology

## 2012-09-15 NOTE — Telephone Encounter (Signed)
lmom for pt to call back

## 2012-09-17 NOTE — Telephone Encounter (Signed)
Pt was put on Prilosec once a day and was told to call back after 3 weeks, she has had some relief but not completely better. She took tums at night and says that helped a lot.  Pt was advised to take Prilosec twice Gaige and will call back in 1 week to report on symptoms.  I will forward to Dr Christella Hartigan for further recommendations

## 2012-09-17 NOTE — Telephone Encounter (Signed)
i agree, thanks 

## 2012-09-29 ENCOUNTER — Ambulatory Visit (INDEPENDENT_AMBULATORY_CARE_PROVIDER_SITE_OTHER): Payer: Medicare Other | Admitting: Cardiology

## 2012-09-29 ENCOUNTER — Encounter: Payer: Self-pay | Admitting: Cardiology

## 2012-09-29 VITALS — BP 132/74 | HR 77 | Ht 61.0 in | Wt 162.3 lb

## 2012-09-29 DIAGNOSIS — I10 Essential (primary) hypertension: Secondary | ICD-10-CM

## 2012-09-29 DIAGNOSIS — I359 Nonrheumatic aortic valve disorder, unspecified: Secondary | ICD-10-CM | POA: Diagnosis not present

## 2012-09-29 DIAGNOSIS — I350 Nonrheumatic aortic (valve) stenosis: Secondary | ICD-10-CM

## 2012-09-29 NOTE — Progress Notes (Signed)
HPI The patient presents for follow up of AS.  Since I last saw her she has had no new SOB or dizziness.  I sent her for an echocardiogram which demonstrated moderate aortic stenosis with preserved ejection fraction. Stress perfusion study demonstrated an EF of 70% with no evidence of ischemia or infarct. Since then she has done well. She's not been having any presyncope or syncope. She's continued to have chest discomfort and is currently being managed by Dr.Jacobs although she's not had any movement with Prilosec. She has no new shortness of breath, PND or orthopnea. She gets around with her rolling walker.  Allergies  Allergen Reactions  . Ace Inhibitors     REACTION: cough  . Aspirin   . Hydrocodone     REACTION: Hallucinations---minor    Current Outpatient Prescriptions  Medication Sig Dispense Refill  . acetaminophen (TYLENOL) 500 MG tablet Take 1,000 mg by mouth Bucklew. And 1 at bedtime       . calcium carbonate (TUMS) 500 MG chewable tablet Chew 1 tablet by mouth as needed.        . carvedilol (COREG) 12.5 MG tablet Take 1 tablet (12.5 mg total) by mouth 2 (two) times Harshfield with a meal.  180 tablet  3  . cholestyramine (QUESTRAN) 4 GM/DOSE powder Take 1 packet (4 g total) by mouth Creasey.  378 g  2  . diphenoxylate-atropine (LOMOTIL) 2.5-0.025 MG per tablet Take 1-2 tablets by mouth Banh as needed for diarrhea or loose stools.  60 tablet  2  . levothyroxine (SYNTHROID, LEVOTHROID) 75 MCG tablet 1 BY MOUTH Kubin  90 tablet  2  . loperamide (IMODIUM A-D) 2 MG tablet Take 4 mg by mouth Armwood.        . Multiple Vitamin (MULTIVITAMIN) tablet Take 1 tablet by mouth Lippert.        Marland Kitchen omeprazole (PRILOSEC) 10 MG capsule Take 10 mg by mouth 2 (two) times Bowns.       No current facility-administered medications for this visit.    Past Medical History  Diagnosis Date  . CARCINOMA, BLADDER, TRANSITIONAL CELL 10/24/2007  . HYPERTENSION 01/07/2007  . HYPOTHYROIDISM 01/07/2007  . IBS  05/03/2008  . MIXED HEARING LOSS BILATERAL 09/28/2009  . OSTEOARTHRITIS, KNEE 04/25/2007  . UNSPECIFIED ARTHROPATHY MULTIPLE SITES 10/26/2009  . Heart murmur     Past Surgical History  Procedure Laterality Date  . Cataract extraction      bilateral  . Bunionectomy      bilateral  . Bladder tumor excision    . Tonsillectomy and adenoidectomy      x2  . Belpharoptosis repair    . Cardiovascular stress test      ROS: As stated in the HPI and negative for all other systems.  PHYSICAL EXAM BP 132/74  Pulse 77  Ht 5\' 1"  (1.549 m)  Wt 162 lb 4.8 oz (73.619 kg)  BMI 30.68 kg/m2 GENERAL:  Well appearing for her age HEENT:  Pupils equal round and reactive, fundi not visualized, oral mucosa unremarkable NECK:  No jugular venous distention, waveform within normal limits, carotid upstroke brisk and symmetric, bilateral bruits, no thyromegaly LYMPHATICS:  No cervical, inguinal adenopathy LUNGS:  Clear to auscultation bilaterally BACK:  No CVA tenderness CHEST:  Unremarkable HEART:  PMI not displaced or sustained,S1 and S2 within normal limits, no S3, no S4, no clicks, no rubs, apical systolic murmur mid peaking radiating out the outflow tract ABD:  Flat, positive bowel sounds normal in  frequency in pitch, no bruits, no rebound, no guarding, no midline pulsatile mass, no hepatomegaly, no splenomegaly EXT:  2 plus pulses throughout, no edema, no cyanosis no clubbing  EKG:  Sinus rhythm, rate 77, axis within normal limits, intervals within normal limits, no acute ST-T wave changes.  09/29/2012   ASSESSMENT AND PLAN   Aortic stenosis - I will review this again with an echocardiogram in one year. This can be managed conservatively for now. She will if necessary be a TAVR candidate in the future.  Chest pain - There is no suggestion of coronary disease as the etiology. No change in therapy is indicated.  HTN - The blood pressure is at target. No change in medications is indicated. We will  continue with therapeutic lifestyle changes (TLC).

## 2012-09-29 NOTE — Patient Instructions (Addendum)

## 2012-09-30 ENCOUNTER — Telehealth: Payer: Self-pay | Admitting: Gastroenterology

## 2012-09-30 NOTE — Telephone Encounter (Signed)
Pt was concerned that she could not take her prilosec at night because she forgets to take it until she has eaten dinner.  I explained that the prilosec will not be as effective unless she takes it 20-30 minutes before eating and she will try to remember to take it as recommended,

## 2012-10-22 ENCOUNTER — Telehealth: Payer: Self-pay | Admitting: Gastroenterology

## 2012-10-22 NOTE — Telephone Encounter (Signed)
Pt aware the prescription has been faxed to the pharmacy

## 2012-11-10 ENCOUNTER — Ambulatory Visit (INDEPENDENT_AMBULATORY_CARE_PROVIDER_SITE_OTHER): Payer: Medicare Other | Admitting: Internal Medicine

## 2012-11-10 ENCOUNTER — Encounter: Payer: Self-pay | Admitting: Internal Medicine

## 2012-11-10 VITALS — BP 140/76 | HR 72 | Temp 98.3°F | Wt 166.0 lb

## 2012-11-10 DIAGNOSIS — R0989 Other specified symptoms and signs involving the circulatory and respiratory systems: Secondary | ICD-10-CM

## 2012-11-10 DIAGNOSIS — R739 Hyperglycemia, unspecified: Secondary | ICD-10-CM

## 2012-11-10 DIAGNOSIS — C679 Malignant neoplasm of bladder, unspecified: Secondary | ICD-10-CM | POA: Diagnosis not present

## 2012-11-10 DIAGNOSIS — R7309 Other abnormal glucose: Secondary | ICD-10-CM

## 2012-11-10 DIAGNOSIS — M1710 Unilateral primary osteoarthritis, unspecified knee: Secondary | ICD-10-CM

## 2012-11-10 DIAGNOSIS — E039 Hypothyroidism, unspecified: Secondary | ICD-10-CM

## 2012-11-10 DIAGNOSIS — I350 Nonrheumatic aortic (valve) stenosis: Secondary | ICD-10-CM

## 2012-11-10 DIAGNOSIS — I359 Nonrheumatic aortic valve disorder, unspecified: Secondary | ICD-10-CM

## 2012-11-10 DIAGNOSIS — IMO0002 Reserved for concepts with insufficient information to code with codable children: Secondary | ICD-10-CM

## 2012-11-10 DIAGNOSIS — D649 Anemia, unspecified: Secondary | ICD-10-CM

## 2012-11-10 DIAGNOSIS — I10 Essential (primary) hypertension: Secondary | ICD-10-CM

## 2012-11-10 LAB — BASIC METABOLIC PANEL
BUN: 13 mg/dL (ref 6–23)
CO2: 28 mEq/L (ref 19–32)
GFR: 90.69 mL/min (ref >60.00)
Glucose, Bld: 97 mg/dL (ref 70–99)
Potassium: 3.7 mEq/L (ref 3.5–5.1)

## 2012-11-10 LAB — CBC WITH DIFFERENTIAL/PLATELET
Basophils Absolute: 0 10*3/uL (ref 0.0–0.1)
Hemoglobin: 12 g/dL (ref 12.0–15.0)
Lymphocytes Relative: 27.6 % (ref 12.0–46.0)
Monocytes Relative: 7.2 % (ref 3.0–12.0)
Neutro Abs: 5.3 10*3/uL (ref 1.4–7.7)
Neutrophils Relative %: 63.7 % (ref 43.0–77.0)
Platelets: 235 10*3/uL (ref 150.0–400.0)
RDW: 15.3 % — ABNORMAL HIGH (ref 11.5–14.6)

## 2012-11-10 LAB — HEPATIC FUNCTION PANEL
AST: 16 U/L (ref 0–37)
Albumin: 3.8 g/dL (ref 3.5–5.2)
Total Protein: 6.4 g/dL (ref 6.0–8.3)

## 2012-11-10 LAB — TSH: TSH: 0.41 u[IU]/mL (ref 0.35–5.50)

## 2012-11-10 NOTE — Progress Notes (Signed)
Patient ID: Susan [name], female   DOB: [birth year], 79 y.o.   MRN: [id number] Patient comes in for six-month evaluation. She's 79 years old. Patient has a history of hypertension. Tolerating her medications without difficulty.  Patient has history of aortic stenosis. She denies any chest pain, dyspnea on exertion, lower extremity edema or other signs of congestive heart failure. No syncope.  Patient has a history of hypothyroidism and is tolerating replacement therapy without difficulty.  Her mobility is limited by osteoarthritis of the knee. She is followed by [facility] orthopedics. She is having great trouble with mobility. She is not able to walk the long distances to dining room. She won't be able to use manual wheelchair. She has chronic shoulder problems  Reviewed past medical history, medications, social history, family history.  Review of systems: She is generally doing quite well. She denies any specific complaints in review of systems.  Physical exam: Elderly female in no acute distress. Vital signs reviewed. Neck is supple. Chest clear to auscultation. Cardiac exam S1-S2 are regular with a 2/6 systolic ejection murmur. Abdominal exam overweight, and bowel sounds, soft her extremities no significant edema.

## 2012-11-11 ENCOUNTER — Encounter: Payer: Self-pay | Admitting: Internal Medicine

## 2012-11-12 NOTE — Assessment & Plan Note (Signed)
BP Readings from Last 3 Encounters:  11/10/12 140/76  09/29/12 132/74  08/19/12 132/60   Reasonable control. Continue current medications.

## 2012-11-12 NOTE — Assessment & Plan Note (Signed)
She has regular followup with urology.

## 2012-11-12 NOTE — Assessment & Plan Note (Signed)
She struggled with osteoarthritis of the knees. She has seen orthopedics for intermittent steroid injections of her knees. She can no longer walk to the dining room. She's having difficulty with her walker. She is going to need assistance with mobility. She also has trouble with her shoulders. She won't be able to manipulate a manual wheelchair. She will need electronic scooter. We will work towards this end.

## 2012-11-12 NOTE — Assessment & Plan Note (Signed)
Check TSH 

## 2012-11-14 DIAGNOSIS — M1710 Unilateral primary osteoarthritis, unspecified knee: Secondary | ICD-10-CM | POA: Diagnosis not present

## 2012-11-14 DIAGNOSIS — M249 Joint derangement, unspecified: Secondary | ICD-10-CM | POA: Diagnosis not present

## 2012-11-14 DIAGNOSIS — M2550 Pain in unspecified joint: Secondary | ICD-10-CM | POA: Diagnosis not present

## 2012-11-17 ENCOUNTER — Encounter: Payer: Self-pay | Admitting: Family

## 2012-11-17 ENCOUNTER — Ambulatory Visit (INDEPENDENT_AMBULATORY_CARE_PROVIDER_SITE_OTHER): Payer: Medicare Other | Admitting: Family

## 2012-11-17 VITALS — BP 140/76 | HR 93 | Wt 166.0 lb

## 2012-11-17 DIAGNOSIS — M249 Joint derangement, unspecified: Secondary | ICD-10-CM | POA: Diagnosis not present

## 2012-11-17 DIAGNOSIS — M1710 Unilateral primary osteoarthritis, unspecified knee: Secondary | ICD-10-CM | POA: Diagnosis not present

## 2012-11-17 DIAGNOSIS — M19049 Primary osteoarthritis, unspecified hand: Secondary | ICD-10-CM | POA: Diagnosis not present

## 2012-11-17 DIAGNOSIS — M2550 Pain in unspecified joint: Secondary | ICD-10-CM | POA: Diagnosis not present

## 2012-11-17 NOTE — Progress Notes (Signed)
Subjective:    Patient ID: Susan [name], female    DOB: 10/24/1918, 79 y.o.   MRN: [id number]  HPI 79 year old white female, nonsmoker, patient of Dr. Cato Mulligan is in for mobility assessment today. Patient has a history of osteoarthritis of both knees and in the past has been candidate for bilateral knee replacement that she has not had done due to her age. She also has arthritis in her hands bilaterally. She is bound by a walker that she uses 100% of the time. It has become more difficult lately to sit worsening arthritis and arthritis now in her hands. She's unable to walk down the halls from her room to the dining room at the assisted living facility without rest. The hallway is 525 feet 1 away and has increased pain that she rates and 9 his hands the right knee and a 7/10 to the left by the time she arrived at the dining hall. She is also had increased pain to her lower back from walking that she rates at 8-9/10. Sitting in a chair helps relieve the pain. She reports her right leg being shorter than her left which has caused her arthritis to be worse. Now that she's been using the walker and depending on it for upper body strength more, she's noticed more discomfort in her shoulders and hands. She sees occupational therapy. She has a history of congestive heart failure and periodic peripheral edema.   Review of Systems  Constitutional: Negative.   HENT: Negative.   Respiratory: Negative.  Negative for shortness of breath.   Cardiovascular: Positive for leg swelling. Negative for chest pain and palpitations.  Gastrointestinal: Negative.   Genitourinary: Negative.   Musculoskeletal: Positive for back pain, arthralgias and gait problem.       Ambulates with a walker 100% of the time. Bilateral knee pain. Low back pain. Pain in her hands and shoulders-recent  Neurological: Negative.   Hematological: Negative.   Psychiatric/Behavioral: Negative.    Past Medical History  Diagnosis Date  .  CARCINOMA, BLADDER, TRANSITIONAL CELL 10/24/2007  . HYPERTENSION 01/07/2007  . HYPOTHYROIDISM 01/07/2007  . IBS 05/03/2008  . MIXED HEARING LOSS BILATERAL 09/28/2009  . OSTEOARTHRITIS, KNEE 04/25/2007  . UNSPECIFIED ARTHROPATHY MULTIPLE SITES 10/26/2009  . Heart murmur     History   Social History  . Marital Status: Widowed    Spouse Name: N/A    Number of Children: 3  . Years of Education: N/A   Occupational History  . retired    Social History Main Topics  . Smoking status: Former Smoker -- 0.50 packs/day for 20 years    Types: Cigarettes    Quit date: 09/05/1973  . Smokeless tobacco: Never Used  . Alcohol Use: Yes     Comment: ocass  . Drug Use: No  . Sexually Active: Not on file   Other Topics Concern  . Not on file   Social History Narrative  . No narrative on file    Past Surgical History  Procedure Laterality Date  . Cataract extraction      bilateral  . Bunionectomy      bilateral  . Bladder tumor excision    . Tonsillectomy and adenoidectomy      x2  . Belpharoptosis repair    . Cardiovascular stress test      Family History  Problem Relation Age of Onset  . Stomach cancer Mother   . Arthritis Mother   . Leukemia Father   . Kidney failure Father   .  Anuerysm Brother     brain  . ALS Sister   . Stomach cancer Maternal Aunt   . Esophageal cancer Neg Hx   . Rectal cancer Neg Hx   . Colon cancer Daughter     Allergies  Allergen Reactions  . Ace Inhibitors     REACTION: cough  . Aspirin   . Hydrocodone     REACTION: Hallucinations---minor    Current Outpatient Prescriptions on File Prior to Visit  Medication Sig Dispense Refill  . acetaminophen (TYLENOL) 500 MG tablet Take 1,000 mg by mouth Mullendore. And 1 at bedtime       . calcium carbonate (TUMS) 500 MG chewable tablet Chew 1 tablet by mouth as needed.        . carvedilol (COREG) 12.5 MG tablet Take 1 tablet (12.5 mg total) by mouth 2 (two) times Glace with a meal.  180 tablet  3  .  cholestyramine (QUESTRAN) 4 GM/DOSE powder Take 4 g by mouth. 2 days on, 1 day off      . diphenoxylate-atropine (LOMOTIL) 2.5-0.025 MG per tablet Take 1-2 tablets by mouth Ewbank as needed for diarrhea or loose stools.  60 tablet  2  . levothyroxine (SYNTHROID, LEVOTHROID) 75 MCG tablet 1 BY MOUTH Mcphillips  90 tablet  2  . loperamide (IMODIUM A-D) 2 MG tablet Take 4 mg by mouth Winning.        . Multiple Vitamin (MULTIVITAMIN) tablet Take 1 tablet by mouth Pankowski.        Marland Kitchen omeprazole (PRILOSEC) 10 MG capsule Take 10 mg by mouth 2 (two) times Andringa.       No current facility-administered medications on file prior to visit.    BP 140/76  Pulse 93  Wt 166 lb (75.297 kg)  BMI 31.38 kg/m2  SpO2 94%chart    Objective:   Physical Exam  Constitutional: She is oriented to person, place, and time. She appears well-developed and well-nourished.  Neck: Normal range of motion. Neck supple.  Cardiovascular: Normal rate and regular rhythm.   Murmur heard. 4/6 systolic ejection murmur.  Pulmonary/Chest: Effort normal and breath sounds normal.  Musculoskeletal: She exhibits edema.  1+ pitting edema noted bilaterally.  Neurological: She is alert and oriented to person, place, and time.  Skin: Skin is warm and dry.  Psychiatric: She has a normal mood and affect.          Assessment & Plan:   assessment:  1. Osteoarthritis-bilateral knees 2. Chronic back pain 3. Osteoarthritis hands bilaterally 4. Congestive heart failure  Plan: We'll refer patient for a power chair to help her ambulate better. I do believe she is a strong candidate for power chair. A scooter is probably not the best option for her given she has edema periodically related to congestive heart failure.

## 2012-11-19 DIAGNOSIS — M2550 Pain in unspecified joint: Secondary | ICD-10-CM | POA: Diagnosis not present

## 2012-11-19 DIAGNOSIS — M249 Joint derangement, unspecified: Secondary | ICD-10-CM | POA: Diagnosis not present

## 2012-11-19 DIAGNOSIS — M1710 Unilateral primary osteoarthritis, unspecified knee: Secondary | ICD-10-CM | POA: Diagnosis not present

## 2012-11-21 DIAGNOSIS — M249 Joint derangement, unspecified: Secondary | ICD-10-CM | POA: Diagnosis not present

## 2012-11-21 DIAGNOSIS — M1710 Unilateral primary osteoarthritis, unspecified knee: Secondary | ICD-10-CM | POA: Diagnosis not present

## 2012-11-21 DIAGNOSIS — M2550 Pain in unspecified joint: Secondary | ICD-10-CM | POA: Diagnosis not present

## 2012-11-24 DIAGNOSIS — M249 Joint derangement, unspecified: Secondary | ICD-10-CM | POA: Diagnosis not present

## 2012-11-24 DIAGNOSIS — M2550 Pain in unspecified joint: Secondary | ICD-10-CM | POA: Diagnosis not present

## 2012-11-24 DIAGNOSIS — M1710 Unilateral primary osteoarthritis, unspecified knee: Secondary | ICD-10-CM | POA: Diagnosis not present

## 2012-11-26 DIAGNOSIS — M1710 Unilateral primary osteoarthritis, unspecified knee: Secondary | ICD-10-CM | POA: Diagnosis not present

## 2012-11-26 DIAGNOSIS — M2550 Pain in unspecified joint: Secondary | ICD-10-CM | POA: Diagnosis not present

## 2012-11-26 DIAGNOSIS — M249 Joint derangement, unspecified: Secondary | ICD-10-CM | POA: Diagnosis not present

## 2012-11-27 DIAGNOSIS — M2550 Pain in unspecified joint: Secondary | ICD-10-CM | POA: Diagnosis not present

## 2012-11-27 DIAGNOSIS — M249 Joint derangement, unspecified: Secondary | ICD-10-CM | POA: Diagnosis not present

## 2012-11-27 DIAGNOSIS — M1710 Unilateral primary osteoarthritis, unspecified knee: Secondary | ICD-10-CM | POA: Diagnosis not present

## 2012-12-02 DIAGNOSIS — M1710 Unilateral primary osteoarthritis, unspecified knee: Secondary | ICD-10-CM | POA: Diagnosis not present

## 2012-12-03 DIAGNOSIS — M249 Joint derangement, unspecified: Secondary | ICD-10-CM | POA: Diagnosis not present

## 2012-12-03 DIAGNOSIS — M1710 Unilateral primary osteoarthritis, unspecified knee: Secondary | ICD-10-CM | POA: Diagnosis not present

## 2012-12-03 DIAGNOSIS — M2550 Pain in unspecified joint: Secondary | ICD-10-CM | POA: Diagnosis not present

## 2012-12-04 DIAGNOSIS — M1710 Unilateral primary osteoarthritis, unspecified knee: Secondary | ICD-10-CM | POA: Diagnosis not present

## 2012-12-04 DIAGNOSIS — M2550 Pain in unspecified joint: Secondary | ICD-10-CM | POA: Diagnosis not present

## 2012-12-04 DIAGNOSIS — M249 Joint derangement, unspecified: Secondary | ICD-10-CM | POA: Diagnosis not present

## 2012-12-05 DIAGNOSIS — M1710 Unilateral primary osteoarthritis, unspecified knee: Secondary | ICD-10-CM | POA: Diagnosis not present

## 2012-12-05 DIAGNOSIS — M249 Joint derangement, unspecified: Secondary | ICD-10-CM | POA: Diagnosis not present

## 2012-12-05 DIAGNOSIS — M2550 Pain in unspecified joint: Secondary | ICD-10-CM | POA: Diagnosis not present

## 2012-12-08 DIAGNOSIS — M249 Joint derangement, unspecified: Secondary | ICD-10-CM | POA: Diagnosis not present

## 2012-12-08 DIAGNOSIS — M2550 Pain in unspecified joint: Secondary | ICD-10-CM | POA: Diagnosis not present

## 2012-12-08 DIAGNOSIS — M1710 Unilateral primary osteoarthritis, unspecified knee: Secondary | ICD-10-CM | POA: Diagnosis not present

## 2012-12-10 DIAGNOSIS — L570 Actinic keratosis: Secondary | ICD-10-CM | POA: Diagnosis not present

## 2012-12-10 DIAGNOSIS — M2550 Pain in unspecified joint: Secondary | ICD-10-CM | POA: Diagnosis not present

## 2012-12-10 DIAGNOSIS — Z85828 Personal history of other malignant neoplasm of skin: Secondary | ICD-10-CM | POA: Diagnosis not present

## 2012-12-10 DIAGNOSIS — L723 Sebaceous cyst: Secondary | ICD-10-CM | POA: Diagnosis not present

## 2012-12-10 DIAGNOSIS — Z85820 Personal history of malignant melanoma of skin: Secondary | ICD-10-CM | POA: Diagnosis not present

## 2012-12-10 DIAGNOSIS — M1710 Unilateral primary osteoarthritis, unspecified knee: Secondary | ICD-10-CM | POA: Diagnosis not present

## 2012-12-10 DIAGNOSIS — M249 Joint derangement, unspecified: Secondary | ICD-10-CM | POA: Diagnosis not present

## 2012-12-10 DIAGNOSIS — L821 Other seborrheic keratosis: Secondary | ICD-10-CM | POA: Diagnosis not present

## 2012-12-11 DIAGNOSIS — M1710 Unilateral primary osteoarthritis, unspecified knee: Secondary | ICD-10-CM | POA: Diagnosis not present

## 2012-12-11 DIAGNOSIS — M2550 Pain in unspecified joint: Secondary | ICD-10-CM | POA: Diagnosis not present

## 2012-12-11 DIAGNOSIS — M249 Joint derangement, unspecified: Secondary | ICD-10-CM | POA: Diagnosis not present

## 2012-12-15 DIAGNOSIS — M249 Joint derangement, unspecified: Secondary | ICD-10-CM | POA: Diagnosis not present

## 2012-12-15 DIAGNOSIS — M1710 Unilateral primary osteoarthritis, unspecified knee: Secondary | ICD-10-CM | POA: Diagnosis not present

## 2012-12-15 DIAGNOSIS — M2550 Pain in unspecified joint: Secondary | ICD-10-CM | POA: Diagnosis not present

## 2012-12-17 DIAGNOSIS — M1710 Unilateral primary osteoarthritis, unspecified knee: Secondary | ICD-10-CM | POA: Diagnosis not present

## 2012-12-17 DIAGNOSIS — M249 Joint derangement, unspecified: Secondary | ICD-10-CM | POA: Diagnosis not present

## 2012-12-17 DIAGNOSIS — M2550 Pain in unspecified joint: Secondary | ICD-10-CM | POA: Diagnosis not present

## 2012-12-19 DIAGNOSIS — M2550 Pain in unspecified joint: Secondary | ICD-10-CM | POA: Diagnosis not present

## 2012-12-19 DIAGNOSIS — M249 Joint derangement, unspecified: Secondary | ICD-10-CM | POA: Diagnosis not present

## 2012-12-19 DIAGNOSIS — M1710 Unilateral primary osteoarthritis, unspecified knee: Secondary | ICD-10-CM | POA: Diagnosis not present

## 2012-12-22 DIAGNOSIS — M2550 Pain in unspecified joint: Secondary | ICD-10-CM | POA: Diagnosis not present

## 2012-12-22 DIAGNOSIS — M1710 Unilateral primary osteoarthritis, unspecified knee: Secondary | ICD-10-CM | POA: Diagnosis not present

## 2012-12-22 DIAGNOSIS — M249 Joint derangement, unspecified: Secondary | ICD-10-CM | POA: Diagnosis not present

## 2012-12-24 DIAGNOSIS — M2550 Pain in unspecified joint: Secondary | ICD-10-CM | POA: Diagnosis not present

## 2012-12-24 DIAGNOSIS — M1710 Unilateral primary osteoarthritis, unspecified knee: Secondary | ICD-10-CM | POA: Diagnosis not present

## 2012-12-24 DIAGNOSIS — M249 Joint derangement, unspecified: Secondary | ICD-10-CM | POA: Diagnosis not present

## 2012-12-26 DIAGNOSIS — M249 Joint derangement, unspecified: Secondary | ICD-10-CM | POA: Diagnosis not present

## 2012-12-26 DIAGNOSIS — M1710 Unilateral primary osteoarthritis, unspecified knee: Secondary | ICD-10-CM | POA: Diagnosis not present

## 2012-12-26 DIAGNOSIS — M2550 Pain in unspecified joint: Secondary | ICD-10-CM | POA: Diagnosis not present

## 2012-12-29 DIAGNOSIS — M2550 Pain in unspecified joint: Secondary | ICD-10-CM | POA: Diagnosis not present

## 2012-12-29 DIAGNOSIS — M249 Joint derangement, unspecified: Secondary | ICD-10-CM | POA: Diagnosis not present

## 2012-12-29 DIAGNOSIS — M1710 Unilateral primary osteoarthritis, unspecified knee: Secondary | ICD-10-CM | POA: Diagnosis not present

## 2012-12-31 DIAGNOSIS — M249 Joint derangement, unspecified: Secondary | ICD-10-CM | POA: Diagnosis not present

## 2012-12-31 DIAGNOSIS — M2550 Pain in unspecified joint: Secondary | ICD-10-CM | POA: Diagnosis not present

## 2012-12-31 DIAGNOSIS — M1710 Unilateral primary osteoarthritis, unspecified knee: Secondary | ICD-10-CM | POA: Diagnosis not present

## 2013-01-01 DIAGNOSIS — M249 Joint derangement, unspecified: Secondary | ICD-10-CM | POA: Diagnosis not present

## 2013-01-01 DIAGNOSIS — M2550 Pain in unspecified joint: Secondary | ICD-10-CM | POA: Diagnosis not present

## 2013-01-01 DIAGNOSIS — M1710 Unilateral primary osteoarthritis, unspecified knee: Secondary | ICD-10-CM | POA: Diagnosis not present

## 2013-01-14 ENCOUNTER — Encounter: Payer: Self-pay | Admitting: Internal Medicine

## 2013-01-14 ENCOUNTER — Ambulatory Visit (INDEPENDENT_AMBULATORY_CARE_PROVIDER_SITE_OTHER): Payer: Medicare Other | Admitting: Internal Medicine

## 2013-01-14 VITALS — BP 110/70 | HR 84 | Temp 98.1°F | Resp 20 | Wt 165.0 lb

## 2013-01-14 DIAGNOSIS — I359 Nonrheumatic aortic valve disorder, unspecified: Secondary | ICD-10-CM | POA: Diagnosis not present

## 2013-01-14 DIAGNOSIS — I6381 Other cerebral infarction due to occlusion or stenosis of small artery: Secondary | ICD-10-CM

## 2013-01-14 DIAGNOSIS — I350 Nonrheumatic aortic (valve) stenosis: Secondary | ICD-10-CM

## 2013-01-14 DIAGNOSIS — I6350 Cerebral infarction due to unspecified occlusion or stenosis of unspecified cerebral artery: Secondary | ICD-10-CM | POA: Diagnosis not present

## 2013-01-14 DIAGNOSIS — I10 Essential (primary) hypertension: Secondary | ICD-10-CM

## 2013-01-14 DIAGNOSIS — R0989 Other specified symptoms and signs involving the circulatory and respiratory systems: Secondary | ICD-10-CM | POA: Diagnosis not present

## 2013-01-14 MED ORDER — HYDROCHLOROTHIAZIDE 12.5 MG PO CAPS: 12.5000 mg | ORAL_CAPSULE | Freq: Every day | ORAL | Status: DC

## 2013-01-14 MED ORDER — ASPIRIN 81 MG PO TABS: 81.0000 mg | ORAL_TABLET | Freq: Every day | ORAL | Status: DC

## 2013-01-14 NOTE — Progress Notes (Signed)
Subjective:    Patient ID: Susan [name], female    DOB: 23-Apr-1919, 80 y.o.   MRN: [id number]  HPI  79 year old patient who has a history of moderate aortic stenosis. She has treated hypertension. She awoke this morning with a numbness and tingling sensation involving her left arm and left leg. The left arm was involved the greater than the leg. Throughout the day the paresthesias have improved. No motor weakness. She is accompanied by a daughter. There's been no change in her mobility speech or mentation. Patient denies any visual changes.  Aspirin therapy discontinued in the past do to bleeding hemorrhoids. The patient denies any true allergy to aspirin  Past Medical History  Diagnosis Date  . CARCINOMA, BLADDER, TRANSITIONAL CELL 10/24/2007  . HYPERTENSION 01/07/2007  . HYPOTHYROIDISM 01/07/2007  . IBS 05/03/2008  . MIXED HEARING LOSS BILATERAL 09/28/2009  . OSTEOARTHRITIS, KNEE 04/25/2007  . UNSPECIFIED ARTHROPATHY MULTIPLE SITES 10/26/2009  . Heart murmur     History   Social History  . Marital Status: Widowed    Spouse Name: N/A    Number of Children: 3  . Years of Education: N/A   Occupational History  . retired    Social History Main Topics  . Smoking status: Former Smoker -- 0.50 packs/day for 20 years    Types: Cigarettes    Quit date: 09/05/1973  . Smokeless tobacco: Never Used  . Alcohol Use: Yes     Comment: ocass  . Drug Use: No  . Sexually Active: Not on file   Other Topics Concern  . Not on file   Social History Narrative  . No narrative on file    Past Surgical History  Procedure Laterality Date  . Cataract extraction      bilateral  . Bunionectomy      bilateral  . Bladder tumor excision    . Tonsillectomy and adenoidectomy      x2  . Belpharoptosis repair    . Cardiovascular stress test      Family History  Problem Relation Age of Onset  . Stomach cancer Mother   . Arthritis Mother   . Leukemia Father   . Kidney failure Father   .  Anuerysm Brother     brain  . ALS Sister   . Stomach cancer Maternal Aunt   . Esophageal cancer Neg Hx   . Rectal cancer Neg Hx   . Colon cancer Daughter     Allergies  Allergen Reactions  . Ace Inhibitors     REACTION: cough  . Aspirin   . Hydrocodone     REACTION: Hallucinations---minor    Current Outpatient Prescriptions on File Prior to Visit  Medication Sig Dispense Refill  . acetaminophen (TYLENOL) 500 MG tablet Take 1,000 mg by mouth Speigner. And 1 at bedtime       . calcium carbonate (TUMS) 500 MG chewable tablet Chew 1 tablet by mouth as needed.        . carvedilol (COREG) 12.5 MG tablet Take 1 tablet (12.5 mg total) by mouth 2 (two) times Steve with a meal.  180 tablet  3  . diphenoxylate-atropine (LOMOTIL) 2.5-0.025 MG per tablet Take 1-2 tablets by mouth Albergo as needed for diarrhea or loose stools.  60 tablet  2  . levothyroxine (SYNTHROID, LEVOTHROID) 75 MCG tablet 1 BY MOUTH Schutter  90 tablet  2  . loperamide (IMODIUM A-D) 2 MG tablet Take 4 mg by mouth as needed.       Marland Kitchen  Multiple Vitamin (MULTIVITAMIN) tablet Take 1 tablet by mouth Bulger.        Marland Kitchen omeprazole (PRILOSEC) 10 MG capsule Take 10 mg by mouth 2 (two) times Monterosso.      . cholestyramine (QUESTRAN) 4 GM/DOSE powder Take 4 g by mouth. 2 days on, 1 day off       No current facility-administered medications on file prior to visit.    BP 110/70  Pulse 84  Temp(Src) 98.1 F (36.7 C) (Oral)  Resp 20  Wt 165 lb (74.844 kg)  BMI 31.19 kg/m2  SpO2 94%     Review of Systems  Constitutional: Negative.   HENT: Negative for hearing loss, congestion, sore throat, rhinorrhea, dental problem, sinus pressure and tinnitus.   Eyes: Negative for pain, discharge and visual disturbance.  Respiratory: Negative for cough and shortness of breath.   Cardiovascular: Negative for chest pain, palpitations and leg swelling.  Gastrointestinal: Negative for nausea, vomiting, abdominal pain, diarrhea, constipation, blood in  stool and abdominal distention.  Genitourinary: Negative for dysuria, urgency, frequency, hematuria, flank pain, vaginal bleeding, vaginal discharge, difficulty urinating, vaginal pain and pelvic pain.  Musculoskeletal: Negative for joint swelling, arthralgias and gait problem.  Skin: Negative for rash.  Neurological: Positive for numbness. Negative for dizziness, syncope, speech difficulty, weakness and headaches.  Hematological: Negative for adenopathy.  Psychiatric/Behavioral: Negative for behavioral problems, dysphoric mood and agitation. The patient is not nervous/anxious.        Objective:   Physical Exam  Constitutional: She is oriented to person, place, and time. She appears well-developed and well-nourished.  Blood pressure 170/70 in both arms  HENT:  Head: Normocephalic.  Right Ear: External ear normal.  Left Ear: External ear normal.  Mouth/Throat: Oropharynx is clear and moist.  Eyes: Conjunctivae and EOM are normal. Pupils are equal, round, and reactive to light.  Neck: Normal range of motion. Neck supple. No thyromegaly present.  Loud bilateral transmitted murmur  Cardiovascular: Normal rate, regular rhythm and intact distal pulses.   Murmur heard. Grade 3/6 heart systolic murmur at the base  Pulmonary/Chest: Effort normal and breath sounds normal.  Abdominal: Soft. Bowel sounds are normal. She exhibits no mass. There is no tenderness.  Musculoskeletal: Normal range of motion.  Lymphadenopathy:    She has no cervical adenopathy.  Neurological: She is alert and oriented to person, place, and time. She has normal reflexes.  Cranial nerve exam is normal No drift of the outstretched arms Finger-nose testing normal Normal grip strength  Mild subjective dysesthesias involving the left arm but not the face  Skin: Skin is warm and dry. No rash noted.  Psychiatric: She has a normal mood and affect. Her behavior is normal.          Assessment & Plan:   Suspect   lacunar (pure sensory) stroke.  Will resume aspirin therapy at a dose of 81 mg Armitage. We'll schedule for carotid artery Doppler studies as well as a unenhanced brain CT scan Hypertension. We'll continue Coreg twice a day. We'll add hydrochlorothiazide 12.5 mg Osorno for better systolic blood pressure control  Recheck 2 weeks

## 2013-01-14 NOTE — Patient Instructions (Signed)
Limit your sodium (Salt) intake  Call if any clinical change

## 2013-01-21 ENCOUNTER — Ambulatory Visit (INDEPENDENT_AMBULATORY_CARE_PROVIDER_SITE_OTHER)
Admission: RE | Admit: 2013-01-21 | Discharge: 2013-01-21 | Disposition: A | Discharge status: Home / Self Care | Payer: Medicare Other | Source: Ambulatory Visit | Attending: Internal Medicine | Admitting: Internal Medicine

## 2013-01-21 DIAGNOSIS — I6350 Cerebral infarction due to unspecified occlusion or stenosis of unspecified cerebral artery: Secondary | ICD-10-CM | POA: Diagnosis not present

## 2013-01-21 DIAGNOSIS — I6381 Other cerebral infarction due to occlusion or stenosis of small artery: Secondary | ICD-10-CM

## 2013-01-21 DIAGNOSIS — R209 Unspecified disturbances of skin sensation: Secondary | ICD-10-CM | POA: Diagnosis not present

## 2013-01-23 ENCOUNTER — Encounter (INDEPENDENT_AMBULATORY_CARE_PROVIDER_SITE_OTHER): Payer: Medicare Other

## 2013-01-23 DIAGNOSIS — I6529 Occlusion and stenosis of unspecified carotid artery: Secondary | ICD-10-CM

## 2013-01-23 DIAGNOSIS — I6350 Cerebral infarction due to unspecified occlusion or stenosis of unspecified cerebral artery: Secondary | ICD-10-CM | POA: Diagnosis not present

## 2013-01-23 DIAGNOSIS — I6381 Other cerebral infarction due to occlusion or stenosis of small artery: Secondary | ICD-10-CM

## 2013-01-29 ENCOUNTER — Encounter: Payer: Self-pay | Admitting: Internal Medicine

## 2013-01-29 ENCOUNTER — Ambulatory Visit (INDEPENDENT_AMBULATORY_CARE_PROVIDER_SITE_OTHER): Payer: Medicare Other | Admitting: Internal Medicine

## 2013-01-29 VITALS — BP 118/70 | HR 75 | Temp 98.1°F | Resp 20 | Wt 164.0 lb

## 2013-01-29 DIAGNOSIS — I350 Nonrheumatic aortic (valve) stenosis: Secondary | ICD-10-CM

## 2013-01-29 DIAGNOSIS — I359 Nonrheumatic aortic valve disorder, unspecified: Secondary | ICD-10-CM

## 2013-01-29 DIAGNOSIS — I10 Essential (primary) hypertension: Secondary | ICD-10-CM

## 2013-01-29 DIAGNOSIS — I6350 Cerebral infarction due to unspecified occlusion or stenosis of unspecified cerebral artery: Secondary | ICD-10-CM

## 2013-01-29 DIAGNOSIS — G466 Pure sensory lacunar syndrome: Secondary | ICD-10-CM

## 2013-01-29 NOTE — Patient Instructions (Signed)
Limit your sodium (Salt) intake  Return in 6 months for follow-up  

## 2013-01-29 NOTE — Progress Notes (Signed)
Subjective:    Patient ID: Susan [name], female    DOB: 09/25/1918, 79 y.o.   MRN: [id number]  HPI  79 year old patient who is seen today for followup.  She was seen a few weeks ago with several hours of left-sided numbness to left arm fracture a greater than the leg. She woke with  numbness in the morning and it slowly resolved throughout the morning. No motor symptoms She has a history of moderate AS Evaluation has included carotid artery Doppler studies that were fairly unremarkable. Head CT revealed microvascular changes only. It was felt that she most likely had a hypertensive/lacunar pure sensory stroke  Past Medical History  Diagnosis Date  . CARCINOMA, BLADDER, TRANSITIONAL CELL 10/24/2007  . HYPERTENSION 01/07/2007  . HYPOTHYROIDISM 01/07/2007  . IBS 05/03/2008  . MIXED HEARING LOSS BILATERAL 09/28/2009  . OSTEOARTHRITIS, KNEE 04/25/2007  . UNSPECIFIED ARTHROPATHY MULTIPLE SITES 10/26/2009  . Heart murmur     History   Social History  . Marital Status: Widowed    Spouse Name: N/A    Number of Children: 3  . Years of Education: N/A   Occupational History  . retired    Social History Main Topics  . Smoking status: Former Smoker -- 0.50 packs/day for 20 years    Types: Cigarettes    Quit date: 09/05/1973  . Smokeless tobacco: Never Used  . Alcohol Use: Yes     Comment: ocass  . Drug Use: No  . Sexually Active: Not on file   Other Topics Concern  . Not on file   Social History Narrative  . No narrative on file    Past Surgical History  Procedure Laterality Date  . Cataract extraction      bilateral  . Bunionectomy      bilateral  . Bladder tumor excision    . Tonsillectomy and adenoidectomy      x2  . Belpharoptosis repair    . Cardiovascular stress test      Family History  Problem Relation Age of Onset  . Stomach cancer Mother   . Arthritis Mother   . Leukemia Father   . Kidney failure Father   . Anuerysm Brother     brain  . ALS Sister   .  Stomach cancer Maternal Aunt   . Esophageal cancer Neg Hx   . Rectal cancer Neg Hx   . Colon cancer Daughter     Allergies  Allergen Reactions  . Ace Inhibitors     REACTION: cough  . Aspirin   . Hydrocodone     REACTION: Hallucinations---minor    Current Outpatient Prescriptions on File Prior to Visit  Medication Sig Dispense Refill  . acetaminophen (TYLENOL) 500 MG tablet Take 1,000 mg by mouth Mccorkle. And 1 at bedtime       . aspirin 81 MG tablet Take 1 tablet (81 mg total) by mouth Creed.  30 tablet    . calcium carbonate (TUMS) 500 MG chewable tablet Chew 1 tablet by mouth as needed.        . carvedilol (COREG) 12.5 MG tablet Take 1 tablet (12.5 mg total) by mouth 2 (two) times Boulanger with a meal.  180 tablet  3  . diphenoxylate-atropine (LOMOTIL) 2.5-0.025 MG per tablet Take 1-2 tablets by mouth Ferrington as needed for diarrhea or loose stools.  60 tablet  2  . hydrochlorothiazide (MICROZIDE) 12.5 MG capsule Take 1 capsule (12.5 mg total) by mouth Rinke.  90 capsule  3  .  levothyroxine (SYNTHROID, LEVOTHROID) 75 MCG tablet 1 BY MOUTH Soderman  90 tablet  2  . loperamide (IMODIUM A-D) 2 MG tablet Take 4 mg by mouth as needed.       . Multiple Vitamin (MULTIVITAMIN) tablet Take 1 tablet by mouth Lehane.        Marland Kitchen omeprazole (PRILOSEC) 10 MG capsule Take 10 mg by mouth 2 (two) times Torok.      . cholestyramine (QUESTRAN) 4 GM/DOSE powder Take 4 g by mouth. 2 days on, 1 day off       No current facility-administered medications on file prior to visit.    BP 118/70  Pulse 75  Temp(Src) 98.1 F (36.7 C) (Oral)  Resp 20  Wt 164 lb (74.39 kg)  BMI 31 kg/m2  SpO2 96%     Review of Systems  Constitutional: Negative.   HENT: Negative for hearing loss, congestion, sore throat, rhinorrhea, dental problem, sinus pressure and tinnitus.   Eyes: Negative for pain, discharge and visual disturbance.  Respiratory: Negative for cough and shortness of breath.   Cardiovascular: Negative for  chest pain, palpitations and leg swelling.  Gastrointestinal: Negative for nausea, vomiting, abdominal pain, diarrhea, constipation, blood in stool and abdominal distention.  Genitourinary: Negative for dysuria, urgency, frequency, hematuria, flank pain, vaginal bleeding, vaginal discharge, difficulty urinating, vaginal pain and pelvic pain.  Musculoskeletal: Negative for joint swelling, arthralgias and gait problem.  Skin: Negative for rash.  Neurological: Positive for numbness. Negative for dizziness, syncope, speech difficulty, weakness and headaches.  Hematological: Negative for adenopathy.  Psychiatric/Behavioral: Negative for behavioral problems, dysphoric mood and agitation. The patient is not nervous/anxious.        Objective:   Physical Exam  Constitutional: She appears well-developed and well-nourished. No distress.  Blood pressure 120/78          Assessment & Plan:   Status post lacunar/pure sensory stroke vs  embolic from cardiac or large vessel plaque. No recurrent symptoms Hypertension well controlled  We'll continue Pettis aspirin and present blood pressure regimen. Recheck 6 months or sooner if any recurrent symptoms

## 2013-02-27 ENCOUNTER — Other Ambulatory Visit: Payer: Self-pay | Admitting: Gastroenterology

## 2013-03-03 ENCOUNTER — Telehealth: Payer: Self-pay | Admitting: Gastroenterology

## 2013-03-03 NOTE — Telephone Encounter (Signed)
Colonoscopy 1 year ago. I agree likely ano-rectal, probably hemorrhoids.  If bleeding recurs, signficant amount then CBC and ROV.  Otherwise agree with fiber, hydration.

## 2013-03-03 NOTE — Telephone Encounter (Signed)
Pt has been notified.

## 2013-03-03 NOTE — Telephone Encounter (Signed)
Bright red rectal bleeding this morning enough to cover the tissue paper.  Pt has history of hemorrhoids,  Bleeding has stopped.  No pain, pt does have some constipation.  She has increased her fiber and water.  Pt was offered appt with extender today but would like recommendation from Dr Christella Hartigan please advise

## 2013-03-05 DIAGNOSIS — M1710 Unilateral primary osteoarthritis, unspecified knee: Secondary | ICD-10-CM | POA: Diagnosis not present

## 2013-05-05 ENCOUNTER — Other Ambulatory Visit: Payer: Self-pay

## 2013-05-05 MED ORDER — DIPHENOXYLATE-ATROPINE 2.5-0.025 MG PO TABS: 1.0000 | ORAL_TABLET | Freq: Every day | ORAL | Status: DC | PRN

## 2013-05-05 MED ORDER — CHOLESTYRAMINE 4 G PO PACK: PACK | ORAL | Status: DC

## 2013-05-05 NOTE — Telephone Encounter (Signed)
Pt prescription request received through fax.

## 2013-05-14 ENCOUNTER — Other Ambulatory Visit: Payer: Self-pay

## 2013-05-18 ENCOUNTER — Ambulatory Visit: Payer: Medicare Other | Admitting: Internal Medicine

## 2013-05-19 ENCOUNTER — Other Ambulatory Visit: Payer: Self-pay | Admitting: *Deleted

## 2013-05-19 MED ORDER — CARVEDILOL 12.5 MG PO TABS: 12.5000 mg | ORAL_TABLET | Freq: Two times a day (BID) | ORAL | Status: DC

## 2013-05-19 MED ORDER — LEVOTHYROXINE SODIUM 75 MCG PO TABS: ORAL_TABLET | ORAL | Status: DC

## 2013-06-02 DIAGNOSIS — M1710 Unilateral primary osteoarthritis, unspecified knee: Secondary | ICD-10-CM | POA: Diagnosis not present

## 2013-07-29 ENCOUNTER — Encounter: Payer: Self-pay | Admitting: Gastroenterology

## 2013-07-29 ENCOUNTER — Ambulatory Visit (INDEPENDENT_AMBULATORY_CARE_PROVIDER_SITE_OTHER): Payer: Medicare Other | Admitting: Gastroenterology

## 2013-07-29 VITALS — BP 146/80 | HR 60 | Ht 61.0 in | Wt 163.4 lb

## 2013-07-29 DIAGNOSIS — K219 Gastro-esophageal reflux disease without esophagitis: Secondary | ICD-10-CM | POA: Diagnosis not present

## 2013-07-29 MED ORDER — FAMOTIDINE 20 MG PO TABS: 20.0000 mg | ORAL_TABLET | Freq: Two times a day (BID) | ORAL | Status: DC

## 2013-07-29 NOTE — Progress Notes (Signed)
Review of pertinent gastrointestinal problems:  1. Colonic adenomas. Colonoscopy 2006 by Dr. Leonie Douglas removed 3 subcentimeter adenomas. Usual followup would be at 3 year interval however given her age we decided not to repeat colonoscopy as yet. She is also very uninterested in repeat colonoscopy. 11/2011 colonoscopy mulitple small TAs all removed, TI normal, normal mucosa biopsied, no microsopic colitis, added questran with very good response  2. Mild, chronic diarrhea: 2011, 2012. Imodium, Lomotil both help   HPI: This is a  very pleasant 80 year old woman whom I last saw about a year ago.  Wondering about Crohn's disease after seeing Ad on TV.    In Sunday's paper,  Article about PPI and hip fraction.  Takes omeprazole, one pill once Korf in AM.  This helps her pyrosis which used to be nightly.  She also asked if it was okay that she take cholestyramine only every 2 or 3 days rather than Poteat.     Past Medical History  Diagnosis Date  . CARCINOMA, BLADDER, TRANSITIONAL CELL 10/24/2007  . HYPERTENSION 01/07/2007  . HYPOTHYROIDISM 01/07/2007  . IBS 05/03/2008  . MIXED HEARING LOSS BILATERAL 09/28/2009  . OSTEOARTHRITIS, KNEE 04/25/2007  . UNSPECIFIED ARTHROPATHY MULTIPLE SITES 10/26/2009  . Heart murmur     Past Surgical History  Procedure Laterality Date  . Cataract extraction      bilateral  . Bunionectomy      bilateral  . Bladder tumor excision    . Tonsillectomy and adenoidectomy      x2  . Belpharoptosis repair    . Cardiovascular stress test      Current Outpatient Prescriptions  Medication Sig Dispense Refill  . acetaminophen (TYLENOL) 500 MG tablet Take 1,000 mg by mouth Surette. And 1 at bedtime       . aspirin 81 MG tablet Take 1 tablet (81 mg total) by mouth Tsou.  30 tablet    . calcium carbonate (TUMS) 500 MG chewable tablet Chew 1 tablet by mouth as needed.        . carvedilol (COREG) 12.5 MG tablet Take 1 tablet (12.5 mg total) by mouth 2 (two) times  Coombs with a meal.  180 tablet  3  . cholestyramine (QUESTRAN) 4 G packet TAKE 1 PACKET (4 G TOTAL) BY  MOUTH Chinn.  30 packet  6  . diphenoxylate-atropine (LOMOTIL) 2.5-0.025 MG per tablet Take 1-2 tablets by mouth Friske as needed for diarrhea or loose stools.  60 tablet  2  . hydrochlorothiazide (MICROZIDE) 12.5 MG capsule Take 1 capsule (12.5 mg total) by mouth Lenker.  90 capsule  3  . levothyroxine (SYNTHROID, LEVOTHROID) 75 MCG tablet 1 BY MOUTH Kulak  90 tablet  3  . loperamide (IMODIUM A-D) 2 MG tablet Take 4 mg by mouth as needed.       . Multiple Vitamin (MULTIVITAMIN) tablet Take 1 tablet by mouth Paczkowski.        Marland Kitchen omeprazole (PRILOSEC) 10 MG capsule Take 10 mg by mouth 2 (two) times Sarvis.       No current facility-administered medications for this visit.    Allergies as of 07/29/2013 - Review Complete 07/29/2013  Allergen Reaction Noted  . Ace inhibitors  07/19/2008  . Aspirin  10/26/2011  . Hydrocodone  06/09/2008    Family History  Problem Relation Age of Onset  . Stomach cancer Mother   . Arthritis Mother   . Leukemia Father   . Kidney failure Father   . Anuerysm Brother  brain  . ALS Sister   . Stomach cancer Maternal Aunt   . Esophageal cancer Neg Hx   . Rectal cancer Neg Hx   . Colon cancer Daughter     History   Social History  . Marital Status: Widowed    Spouse Name: N/A    Number of Children: 3  . Years of Education: N/A   Occupational History  . retired    Social History Main Topics  . Smoking status: Former Smoker -- 0.50 packs/day for 20 years    Types: Cigarettes    Quit date: 09/05/1973  . Smokeless tobacco: Never Used  . Alcohol Use: Yes     Comment: ocass  . Drug Use: No  . Sexual Activity: Not on file   Other Topics Concern  . Not on file   Social History Narrative  . No narrative on file      Physical Exam: BP 146/80  Pulse 60  Ht 5\' 1"  (1.549 m)  Wt 163 lb 6.4 oz (74.118 kg)  BMI 30.89 kg/m2 Constitutional:  generally well-appearing Psychiatric: alert and oriented x3 Abdomen: soft, nontender, nondistended, no obvious ascites, no peritoneal signs, normal bowel sounds     Assessment and plan: 80 y.o. female with chronic GERD, chronic loose stools well controlled.  First I explained to her that there is a potential risk of ostial tenia in patients who take proton pump inhibitor Theroux. It is a very small risk however at her age in fact she uses a walker I think it would be best to get her off the PPI if a suitable alternative can be found. She is going to try Pepcid twice Brunetto and if that controls her GERD symptoms as well as the trocar pump inhibitor and she'll just stay on H2 blocker instead. I also explained that it is safe for her take cholestyramine only every 2 or 3 days. She takes more than that she gets quite constipated. She'll followup on an as-needed basis.

## 2013-07-29 NOTE — Patient Instructions (Addendum)
Stop taking omeprazole Nowack. Start pepcid (one pill in AM and one at bedtime), new prescription was called in to [facility]. OK to adjust your cholestyramine as needed.

## 2013-08-03 ENCOUNTER — Ambulatory Visit (INDEPENDENT_AMBULATORY_CARE_PROVIDER_SITE_OTHER): Payer: Medicare Other | Admitting: Internal Medicine

## 2013-08-03 ENCOUNTER — Ambulatory Visit: Payer: Medicare Other | Admitting: Internal Medicine

## 2013-08-03 ENCOUNTER — Encounter: Payer: Self-pay | Admitting: Internal Medicine

## 2013-08-03 VITALS — BP 130/74 | HR 76 | Temp 97.8°F | Resp 20 | Ht 61.0 in | Wt 165.0 lb

## 2013-08-03 DIAGNOSIS — I350 Nonrheumatic aortic (valve) stenosis: Secondary | ICD-10-CM

## 2013-08-03 DIAGNOSIS — E039 Hypothyroidism, unspecified: Secondary | ICD-10-CM

## 2013-08-03 DIAGNOSIS — G466 Pure sensory lacunar syndrome: Secondary | ICD-10-CM

## 2013-08-03 DIAGNOSIS — I10 Essential (primary) hypertension: Secondary | ICD-10-CM | POA: Diagnosis not present

## 2013-08-03 DIAGNOSIS — I359 Nonrheumatic aortic valve disorder, unspecified: Secondary | ICD-10-CM | POA: Diagnosis not present

## 2013-08-03 DIAGNOSIS — I6350 Cerebral infarction due to unspecified occlusion or stenosis of unspecified cerebral artery: Secondary | ICD-10-CM | POA: Diagnosis not present

## 2013-08-03 DIAGNOSIS — Z23 Encounter for immunization: Secondary | ICD-10-CM

## 2013-08-03 DIAGNOSIS — R0989 Other specified symptoms and signs involving the circulatory and respiratory systems: Secondary | ICD-10-CM

## 2013-08-03 MED ORDER — ATORVASTATIN CALCIUM 20 MG PO TABS: 20.0000 mg | ORAL_TABLET | Freq: Every day | ORAL | Status: DC

## 2013-08-03 NOTE — Patient Instructions (Signed)
Limit your sodium (Salt) intake  Return in 6 months for follow-up  

## 2013-08-03 NOTE — Progress Notes (Signed)
Pre-visit discussion using our clinic review tool. No additional management support is needed unless otherwise documented below in the visit note.  

## 2013-08-03 NOTE — Progress Notes (Signed)
Subjective:    Patient ID: Susan [name], female    DOB: Jan 28, 1919, 80 y.o.   MRN: [id number]  HPI  80 year old patient who is seen today for her six-month followup. She has treated hypertension and has done remarkable well over the past 6 months. Last summer she was evaluated for a suspected lacunar stroke and she has had no recurrent symptoms. She does have a history of aortic stenosis and remains asymptomatic. She has been seen by GI recently and omeprazole has been discontinued and Pepcid substituted  Past Medical History  Diagnosis Date  . CARCINOMA, BLADDER, TRANSITIONAL CELL 10/24/2007  . HYPERTENSION 01/07/2007  . HYPOTHYROIDISM 01/07/2007  . IBS 05/03/2008  . MIXED HEARING LOSS BILATERAL 09/28/2009  . OSTEOARTHRITIS, KNEE 04/25/2007  . UNSPECIFIED ARTHROPATHY MULTIPLE SITES 10/26/2009  . Heart murmur     History   Social History  . Marital Status: Widowed    Spouse Name: N/A    Number of Children: 3  . Years of Education: N/A   Occupational History  . retired    Social History Main Topics  . Smoking status: Former Smoker -- 0.50 packs/day for 20 years    Types: Cigarettes    Quit date: 09/05/1973  . Smokeless tobacco: Never Used  . Alcohol Use: Yes     Comment: ocass  . Drug Use: No  . Sexual Activity: Not on file   Other Topics Concern  . Not on file   Social History Narrative  . No narrative on file    Past Surgical History  Procedure Laterality Date  . Cataract extraction      bilateral  . Bunionectomy      bilateral  . Bladder tumor excision    . Tonsillectomy and adenoidectomy      x2  . Belpharoptosis repair    . Cardiovascular stress test      Family History  Problem Relation Age of Onset  . Stomach cancer Mother   . Arthritis Mother   . Leukemia Father   . Kidney failure Father   . Anuerysm Brother     brain  . ALS Sister   . Stomach cancer Maternal Aunt   . Esophageal cancer Neg Hx   . Rectal cancer Neg Hx   . Colon cancer  Daughter     Allergies  Allergen Reactions  . Ace Inhibitors     REACTION: cough  . Aspirin   . Hydrocodone     REACTION: Hallucinations---minor    Current Outpatient Prescriptions on File Prior to Visit  Medication Sig Dispense Refill  . acetaminophen (TYLENOL) 500 MG tablet Take 1,000 mg by mouth Greenwood. And 1 at bedtime       . aspirin 81 MG tablet Take 1 tablet (81 mg total) by mouth Fannin.  30 tablet    . calcium carbonate (TUMS) 500 MG chewable tablet Chew 1 tablet by mouth as needed.        . carvedilol (COREG) 12.5 MG tablet Take 1 tablet (12.5 mg total) by mouth 2 (two) times Rotolo with a meal.  180 tablet  3  . cholestyramine (QUESTRAN) 4 G packet TAKE 1 PACKET (4 G TOTAL) BY  MOUTH Lutes.  30 packet  6  . diphenoxylate-atropine (LOMOTIL) 2.5-0.025 MG per tablet Take 1-2 tablets by mouth Stucke as needed for diarrhea or loose stools.  60 tablet  2  . famotidine (PEPCID) 20 MG tablet Take 1 tablet (20 mg total) by mouth 2 (two) times Scherzinger.  60 tablet  11  . hydrochlorothiazide (MICROZIDE) 12.5 MG capsule Take 1 capsule (12.5 mg total) by mouth Rosevear.  90 capsule  3  . levothyroxine (SYNTHROID, LEVOTHROID) 75 MCG tablet 1 BY MOUTH Johnsen  90 tablet  3  . loperamide (IMODIUM A-D) 2 MG tablet Take 4 mg by mouth as needed.       . Multiple Vitamin (MULTIVITAMIN) tablet Take 1 tablet by mouth Poellnitz.         No current facility-administered medications on file prior to visit.    BP 130/74  Pulse 76  Temp(Src) 97.8 F (36.6 C) (Oral)  Resp 20  Ht 5\' 1"  (1.549 m)  Wt 165 lb (74.844 kg)  BMI 31.19 kg/m2  SpO2 97%       Review of Systems  Constitutional: Negative.   HENT: Negative for congestion, dental problem, hearing loss, rhinorrhea, sinus pressure, sore throat and tinnitus.   Eyes: Negative for pain, discharge and visual disturbance.  Respiratory: Negative for cough and shortness of breath.   Cardiovascular: Negative for chest pain, palpitations and leg swelling.    Gastrointestinal: Negative for nausea, vomiting, abdominal pain, diarrhea, constipation, blood in stool and abdominal distention.  Genitourinary: Negative for dysuria, urgency, frequency, hematuria, flank pain, vaginal bleeding, vaginal discharge, difficulty urinating, vaginal pain and pelvic pain.  Musculoskeletal: Positive for gait problem (uses a walker and motorized wheelchair). Negative for arthralgias and joint swelling.  Skin: Negative for rash.  Neurological: Negative for dizziness, syncope, speech difficulty, weakness, numbness and headaches.  Hematological: Negative for adenopathy.  Psychiatric/Behavioral: Negative for behavioral problems, dysphoric mood and agitation. The patient is not nervous/anxious.        Objective:   Physical Exam  Constitutional: She is oriented to person, place, and time. She appears well-developed and well-nourished.  HENT:  Head: Normocephalic.  Right Ear: External ear normal.  Left Ear: External ear normal.  Mouth/Throat: Oropharynx is clear and moist.  Eyes: Conjunctivae and EOM are normal. Pupils are equal, round, and reactive to light.  Neck: Normal range of motion. Neck supple. No thyromegaly present.  Loud  bruits versus transmitted murmur  Cardiovascular: Normal rate, regular rhythm and intact distal pulses.   Murmur heard. Grade 3/6 coarse systolic murmur heard diffusely  Pulmonary/Chest: Effort normal and breath sounds normal.  Abdominal: Soft. Bowel sounds are normal. She exhibits no mass. There is no tenderness.  Musculoskeletal: Normal range of motion.  Lymphadenopathy:    She has no cervical adenopathy.  Neurological: She is alert and oriented to person, place, and time.  Skin: Skin is warm and dry. No rash noted.  Psychiatric: She has a normal mood and affect. Her behavior is normal.          Assessment & Plan:   Cerebrovascular disease. Remained stable. Continue aspirin therapy and aggressive risk factor modification. The  benefits of statin therapy discussed Aortic stenosis. Benefits of statin therapy discussed Hypertension well controlled Hypothyroidism. We'll check labs next visit

## 2013-08-21 ENCOUNTER — Ambulatory Visit: Payer: Medicare Other | Admitting: Gastroenterology

## 2013-09-10 DIAGNOSIS — R82998 Other abnormal findings in urine: Secondary | ICD-10-CM | POA: Diagnosis not present

## 2013-09-10 DIAGNOSIS — N3942 Incontinence without sensory awareness: Secondary | ICD-10-CM | POA: Diagnosis not present

## 2013-09-10 DIAGNOSIS — Z8551 Personal history of malignant neoplasm of bladder: Secondary | ICD-10-CM | POA: Diagnosis not present

## 2013-09-10 DIAGNOSIS — R3129 Other microscopic hematuria: Secondary | ICD-10-CM | POA: Diagnosis not present

## 2013-09-17 DIAGNOSIS — IMO0002 Reserved for concepts with insufficient information to code with codable children: Secondary | ICD-10-CM | POA: Diagnosis not present

## 2013-09-17 DIAGNOSIS — M1710 Unilateral primary osteoarthritis, unspecified knee: Secondary | ICD-10-CM | POA: Diagnosis not present

## 2013-09-29 DIAGNOSIS — IMO0002 Reserved for concepts with insufficient information to code with codable children: Secondary | ICD-10-CM | POA: Diagnosis not present

## 2013-09-29 DIAGNOSIS — M1710 Unilateral primary osteoarthritis, unspecified knee: Secondary | ICD-10-CM | POA: Diagnosis not present

## 2013-11-17 DIAGNOSIS — G5600 Carpal tunnel syndrome, unspecified upper limb: Secondary | ICD-10-CM | POA: Diagnosis not present

## 2013-11-17 DIAGNOSIS — M1710 Unilateral primary osteoarthritis, unspecified knee: Secondary | ICD-10-CM | POA: Diagnosis not present

## 2013-11-18 DIAGNOSIS — Z85820 Personal history of malignant melanoma of skin: Secondary | ICD-10-CM | POA: Diagnosis not present

## 2013-11-18 DIAGNOSIS — B372 Candidiasis of skin and nail: Secondary | ICD-10-CM | POA: Diagnosis not present

## 2013-11-18 DIAGNOSIS — Z85828 Personal history of other malignant neoplasm of skin: Secondary | ICD-10-CM | POA: Diagnosis not present

## 2013-11-18 DIAGNOSIS — L821 Other seborrheic keratosis: Secondary | ICD-10-CM | POA: Diagnosis not present

## 2013-11-25 DIAGNOSIS — H353190 Nonexudative age-related macular degeneration, unspecified eye, stage unspecified: Secondary | ICD-10-CM | POA: Diagnosis not present

## 2013-11-25 DIAGNOSIS — H04129 Dry eye syndrome of unspecified lacrimal gland: Secondary | ICD-10-CM | POA: Diagnosis not present

## 2013-11-25 DIAGNOSIS — Z961 Presence of intraocular lens: Secondary | ICD-10-CM | POA: Diagnosis not present

## 2013-12-31 DIAGNOSIS — M1710 Unilateral primary osteoarthritis, unspecified knee: Secondary | ICD-10-CM | POA: Diagnosis not present

## 2014-01-02 ENCOUNTER — Other Ambulatory Visit: Payer: Self-pay | Admitting: Internal Medicine

## 2014-01-06 DIAGNOSIS — L219 Seborrheic dermatitis, unspecified: Secondary | ICD-10-CM | POA: Diagnosis not present

## 2014-01-06 DIAGNOSIS — L820 Inflamed seborrheic keratosis: Secondary | ICD-10-CM | POA: Diagnosis not present

## 2014-01-06 DIAGNOSIS — D1801 Hemangioma of skin and subcutaneous tissue: Secondary | ICD-10-CM | POA: Diagnosis not present

## 2014-01-06 DIAGNOSIS — L821 Other seborrheic keratosis: Secondary | ICD-10-CM | POA: Diagnosis not present

## 2014-01-06 DIAGNOSIS — Z85828 Personal history of other malignant neoplasm of skin: Secondary | ICD-10-CM | POA: Diagnosis not present

## 2014-01-15 DIAGNOSIS — L820 Inflamed seborrheic keratosis: Secondary | ICD-10-CM | POA: Diagnosis not present

## 2014-01-15 DIAGNOSIS — Z85828 Personal history of other malignant neoplasm of skin: Secondary | ICD-10-CM | POA: Diagnosis not present

## 2014-01-29 ENCOUNTER — Encounter: Payer: Self-pay | Admitting: Internal Medicine

## 2014-01-29 ENCOUNTER — Ambulatory Visit (INDEPENDENT_AMBULATORY_CARE_PROVIDER_SITE_OTHER): Payer: Medicare Other | Admitting: Internal Medicine

## 2014-01-29 VITALS — BP 136/60 | HR 76 | Temp 97.9°F | Ht 61.0 in | Wt 165.0 lb

## 2014-01-29 DIAGNOSIS — I6350 Cerebral infarction due to unspecified occlusion or stenosis of unspecified cerebral artery: Secondary | ICD-10-CM | POA: Diagnosis not present

## 2014-01-29 DIAGNOSIS — IMO0002 Reserved for concepts with insufficient information to code with codable children: Secondary | ICD-10-CM | POA: Diagnosis not present

## 2014-01-29 DIAGNOSIS — M1710 Unilateral primary osteoarthritis, unspecified knee: Secondary | ICD-10-CM

## 2014-01-29 DIAGNOSIS — D509 Iron deficiency anemia, unspecified: Secondary | ICD-10-CM | POA: Diagnosis not present

## 2014-01-29 DIAGNOSIS — I350 Nonrheumatic aortic (valve) stenosis: Secondary | ICD-10-CM

## 2014-01-29 DIAGNOSIS — E039 Hypothyroidism, unspecified: Secondary | ICD-10-CM

## 2014-01-29 DIAGNOSIS — I359 Nonrheumatic aortic valve disorder, unspecified: Secondary | ICD-10-CM

## 2014-01-29 DIAGNOSIS — I10 Essential (primary) hypertension: Secondary | ICD-10-CM

## 2014-01-29 LAB — CBC WITH DIFFERENTIAL/PLATELET
BASOS PCT: 0.2 % (ref 0.0–3.0)
Basophils Absolute: 0 10*3/uL (ref 0.0–0.1)
EOS PCT: 1.5 % (ref 0.0–5.0)
Eosinophils Absolute: 0.1 10*3/uL (ref 0.0–0.7)
HCT: 33.3 % — ABNORMAL LOW (ref 36.0–46.0)
HEMOGLOBIN: 11 g/dL — AB (ref 12.0–15.0)
LYMPHS PCT: 30.8 % (ref 12.0–46.0)
Lymphs Abs: 2.7 10*3/uL (ref 0.7–4.0)
MCHC: 33 g/dL (ref 30.0–36.0)
MCV: 88.6 fl (ref 78.0–100.0)
Monocytes Absolute: 0.6 10*3/uL (ref 0.1–1.0)
Monocytes Relative: 7.2 % (ref 3.0–12.0)
NEUTROS ABS: 5.4 10*3/uL (ref 1.4–7.7)
NEUTROS PCT: 60.3 % (ref 43.0–77.0)
Platelets: 230 10*3/uL (ref 150.0–400.0)
RBC: 3.76 Mil/uL — AB (ref 3.87–5.11)
RDW: 15.6 % — ABNORMAL HIGH (ref 11.5–15.5)
WBC: 8.9 10*3/uL (ref 4.0–10.5)

## 2014-01-29 LAB — COMPREHENSIVE METABOLIC PANEL
ALBUMIN: 3.5 g/dL (ref 3.5–5.2)
ALT: 13 U/L (ref 0–35)
AST: 12 U/L (ref 0–37)
Alkaline Phosphatase: 67 U/L (ref 39–117)
BUN: 21 mg/dL (ref 6–23)
CO2: 29 mEq/L (ref 19–32)
CREATININE: 0.9 mg/dL (ref 0.4–1.2)
Calcium: 9.5 mg/dL (ref 8.4–10.5)
Chloride: 107 mEq/L (ref 96–112)
GFR: 62.93 mL/min (ref >60.00)
GLUCOSE: 111 mg/dL — AB (ref 70–99)
POTASSIUM: 3.5 meq/L (ref 3.5–5.1)
Sodium: 142 mEq/L (ref 135–145)
Total Bilirubin: 0.5 mg/dL (ref 0.2–1.2)
Total Protein: 6.2 g/dL (ref 6.0–8.3)

## 2014-01-29 LAB — LIPID PANEL
CHOLESTEROL: 102 mg/dL (ref 0–200)
HDL: 55.4 mg/dL (ref >39.00)
LDL Cholesterol: 25 mg/dL (ref 0–99)
NonHDL: 46.6
TRIGLYCERIDES: 109 mg/dL (ref 0.0–149.0)
Total CHOL/HDL Ratio: 2
VLDL: 21.8 mg/dL (ref 0.0–40.0)

## 2014-01-29 LAB — TSH: TSH: 0.4 u[IU]/mL (ref 0.35–4.50)

## 2014-01-29 NOTE — Progress Notes (Signed)
Subjective:    Patient ID: Susan [name], female    DOB: August 25, 1918, 80 y.o.   MRN: [id number]  HPI 80 year old patient who is seen today in followup.  She is followed by orthopedics.  Dermatology and cardiology.  She has a history of hypertension, which has been stable.  She has arthritis and knee pain.  She also has a history of moderate aortic stenosis, which has also been stable.  She uses a powered wheelchair for mobility. She also has a history of hypothyroidism. No recent lab She is scheduled to see cardiology next week  Past Medical History  Diagnosis Date  . CARCINOMA, BLADDER, TRANSITIONAL CELL 10/24/2007  . HYPERTENSION 01/07/2007  . HYPOTHYROIDISM 01/07/2007  . IBS 05/03/2008  . MIXED HEARING LOSS BILATERAL 09/28/2009  . OSTEOARTHRITIS, KNEE 04/25/2007  . UNSPECIFIED ARTHROPATHY MULTIPLE SITES 10/26/2009  . Heart murmur     History   Social History  . Marital Status: Widowed    Spouse Name: N/A    Number of Children: 3  . Years of Education: N/A   Occupational History  . retired    Social History Main Topics  . Smoking status: Former Smoker -- 0.50 packs/day for 20 years    Types: Cigarettes    Quit date: 09/05/1973  . Smokeless tobacco: Never Used  . Alcohol Use: Yes     Comment: ocass  . Drug Use: No  . Sexual Activity: Not on file   Other Topics Concern  . Not on file   Social History Narrative  . No narrative on file    Past Surgical History  Procedure Laterality Date  . Cataract extraction      bilateral  . Bunionectomy      bilateral  . Bladder tumor excision    . Tonsillectomy and adenoidectomy      x2  . Belpharoptosis repair    . Cardiovascular stress test      Family History  Problem Relation Age of Onset  . Stomach cancer Mother   . Arthritis Mother   . Leukemia Father   . Kidney failure Father   . Anuerysm Brother     brain  . ALS Sister   . Stomach cancer Maternal Aunt   . Esophageal cancer Neg Hx   . Rectal cancer Neg Hx    . Colon cancer Daughter     Allergies  Allergen Reactions  . Ace Inhibitors     REACTION: cough  . Aspirin   . Hydrocodone     REACTION: Hallucinations---minor    Current Outpatient Prescriptions on File Prior to Visit  Medication Sig Dispense Refill  . acetaminophen (TYLENOL) 500 MG tablet Take 1,000 mg by mouth Ion. And 1 at bedtime       . aspirin 81 MG tablet Take 1 tablet (81 mg total) by mouth Pae.  30 tablet    . atorvastatin (LIPITOR) 20 MG tablet Take 1 tablet (20 mg total) by mouth Kiedrowski.  90 tablet  3  . calcium carbonate (TUMS) 500 MG chewable tablet Chew 1 tablet by mouth as needed.        . carvedilol (COREG) 12.5 MG tablet Take 1 tablet (12.5 mg total) by mouth 2 (two) times Ems with a meal.  180 tablet  3  . cholestyramine (QUESTRAN) 4 G packet TAKE 1 PACKET (4 G TOTAL) BY  MOUTH Middlekauff.  30 packet  6  . diphenoxylate-atropine (LOMOTIL) 2.5-0.025 MG per tablet Take 1-2 tablets by mouth Betton as  needed for diarrhea or loose stools.  60 tablet  2  . famotidine (PEPCID) 20 MG tablet Take 1 tablet (20 mg total) by mouth 2 (two) times Bewley.  60 tablet  11  . hydrochlorothiazide (MICROZIDE) 12.5 MG capsule TAKE (1) CAPSULE Street.  90 capsule  1  . levothyroxine (SYNTHROID, LEVOTHROID) 75 MCG tablet 1 BY MOUTH Minervini  90 tablet  3  . loperamide (IMODIUM A-D) 2 MG tablet Take 4 mg by mouth as needed.       . Multiple Vitamin (MULTIVITAMIN) tablet Take 1 tablet by mouth Kochanski.         No current facility-administered medications on file prior to visit.    BP 136/60  Pulse 76  Temp(Src) 97.9 F (36.6 C) (Oral)  Ht 5\' 1"  (1.549 m)  Wt 165 lb (74.844 kg)  BMI 31.19 kg/m2      Review of Systems  Constitutional: Positive for fatigue.  HENT: Negative for congestion, dental problem, hearing loss, rhinorrhea, sinus pressure, sore throat and tinnitus.   Eyes: Negative for pain, discharge and visual disturbance.  Respiratory: Negative for cough and shortness of  breath.   Cardiovascular: Negative for chest pain, palpitations and leg swelling.  Gastrointestinal: Negative for nausea, vomiting, abdominal pain, diarrhea, constipation, blood in stool and abdominal distention.  Genitourinary: Negative for dysuria, urgency, frequency, hematuria, flank pain, vaginal bleeding, vaginal discharge, difficulty urinating, vaginal pain and pelvic pain.  Musculoskeletal: Positive for arthralgias and gait problem. Negative for joint swelling.  Skin: Negative for rash.  Neurological: Positive for weakness. Negative for dizziness, syncope, speech difficulty, numbness and headaches.  Hematological: Negative for adenopathy.  Psychiatric/Behavioral: Negative for behavioral problems, dysphoric mood and agitation. The patient is not nervous/anxious.        Objective:   Physical Exam  Constitutional: She is oriented to person, place, and time. She appears well-developed and well-nourished.  HENT:  Head: Normocephalic.  Right Ear: External ear normal.  Left Ear: External ear normal.  Mouth/Throat: Oropharynx is clear and moist.  Eyes: Conjunctivae and EOM are normal. Pupils are equal, round, and reactive to light.  Neck: Normal range of motion. Neck supple. No thyromegaly present.  Cardiovascular: Normal rate and regular rhythm.   Murmur heard. Grade 4/6 systolic murmur lattice at the primary aortic area with wide transmission to the carotid areas  Pulmonary/Chest: Effort normal and breath sounds normal.  Abdominal: Soft. Bowel sounds are normal. She exhibits no mass. There is no tenderness.  Musculoskeletal: Normal range of motion. She exhibits no edema.  Lymphadenopathy:    She has no cervical adenopathy.  Neurological: She is alert and oriented to person, place, and time.  Skin: Skin is warm and dry. No rash noted.  Psychiatric: She has a normal mood and affect. Her behavior is normal.          Assessment & Plan:   Hypertension well controlled Moderate  aortic stenosis.  Asymptomatic, but activity limited due to age and arthritis Hypothyroidism.  We'll check a TSH Osteoarthritis  Follow cardiology Recheck one year We reviewed laboratory update

## 2014-01-29 NOTE — Patient Instructions (Signed)
Limit your sodium (Salt) intake  Return in one year for follow-up  Take a calcium supplement, plus [id number] units of vitamin D

## 2014-01-29 NOTE — Progress Notes (Signed)
Pre visit review using our clinic review tool, if applicable. No additional management support is needed unless otherwise documented below in the visit note. 

## 2014-02-01 ENCOUNTER — Ambulatory Visit: Payer: Medicare Other | Admitting: Internal Medicine

## 2014-02-08 ENCOUNTER — Telehealth: Payer: Self-pay | Admitting: Internal Medicine

## 2014-02-08 NOTE — Telephone Encounter (Signed)
fyi

## 2014-02-08 NOTE — Telephone Encounter (Signed)
Patient Information:  Caller Name: Martin  Phone: [phone]  Patient: Mcloughlin, Vanilla  Gender: Female  DOB: 11-06-1918  Age: 80 Years  PCP: Bluford Kaufmann (Family Practice > 80yrs old)  Office Follow Up:  Does the office need to follow up with this patient?: No  Instructions For The Office: N/A  RN Note:  Pt wants to see her cardiologist first (next week) and then will call back if she needs to see Dr. Raliegh Ip.  Symptoms  Reason For Call & Symptoms: Onset 02/08/2014 of low BP?, 104/58 with HR 66 (standing 98/56).  Also c/o weakness and fell a couple of nights ago.  Reviewed Health History In EMR: Yes  Reviewed Medications In EMR: Yes  Reviewed Allergies In EMR: Yes  Reviewed Surgeries / Procedures: Yes  Date of Onset of Symptoms: 02/08/2014  Guideline(s) Used:  Weakness (Generalized) and Fatigue  Disposition Per Guideline:   Home Care  Reason For Disposition Reached:   Mild weakness or fatigue with acute minor illness (e.g., colds)  Advice Given:  Reassurance  Weakness often accompanies viral illnesses (e.g., colds and flu).  The weakness is usually worse the first 3 days of the illness, then gets better.  A fever can make you feel weak.  Here is some care advice that should help.  Call Back If:  Unable to stand or walk  Passes out  Breathing difficulty occurs  You become worse.  Patient Refused Recommendation:  Patient Will Make Own Appointment  Pt wants to keep appointment with cardiologist first and will call back if needs to see Dr. Raliegh Ip.

## 2014-02-09 ENCOUNTER — Other Ambulatory Visit: Payer: Self-pay | Admitting: Gastroenterology

## 2014-02-09 MED ORDER — DIPHENOXYLATE-ATROPINE 2.5-0.025 MG PO TABS: ORAL_TABLET | ORAL | Status: DC

## 2014-02-09 NOTE — Telephone Encounter (Signed)
The rx Dr Ardis Hughs wrote did not print, I reprinted and faxed to the pharmacy

## 2014-02-09 NOTE — Addendum Note (Signed)
Addended by: Barron Alvine on: 02/09/2014 11:11 AM   Modules accepted: Orders

## 2014-02-10 DIAGNOSIS — M1710 Unilateral primary osteoarthritis, unspecified knee: Secondary | ICD-10-CM | POA: Diagnosis not present

## 2014-02-17 DIAGNOSIS — Z85828 Personal history of other malignant neoplasm of skin: Secondary | ICD-10-CM | POA: Diagnosis not present

## 2014-02-17 DIAGNOSIS — L570 Actinic keratosis: Secondary | ICD-10-CM | POA: Diagnosis not present

## 2014-02-17 DIAGNOSIS — M1710 Unilateral primary osteoarthritis, unspecified knee: Secondary | ICD-10-CM | POA: Diagnosis not present

## 2014-02-17 DIAGNOSIS — L821 Other seborrheic keratosis: Secondary | ICD-10-CM | POA: Diagnosis not present

## 2014-02-23 ENCOUNTER — Encounter: Payer: Self-pay | Admitting: Cardiology

## 2014-02-23 ENCOUNTER — Ambulatory Visit (INDEPENDENT_AMBULATORY_CARE_PROVIDER_SITE_OTHER): Payer: Medicare Other | Admitting: Cardiology

## 2014-02-23 VITALS — BP 140/60 | HR 76 | Ht 61.0 in | Wt 165.0 lb

## 2014-02-23 DIAGNOSIS — I350 Nonrheumatic aortic (valve) stenosis: Secondary | ICD-10-CM

## 2014-02-23 DIAGNOSIS — I6350 Cerebral infarction due to unspecified occlusion or stenosis of unspecified cerebral artery: Secondary | ICD-10-CM | POA: Diagnosis not present

## 2014-02-23 DIAGNOSIS — I359 Nonrheumatic aortic valve disorder, unspecified: Secondary | ICD-10-CM | POA: Diagnosis not present

## 2014-02-23 NOTE — Patient Instructions (Signed)
Your physician recommends that you schedule a follow-up appointment in:  One year with Dr. Hochrein  

## 2014-02-23 NOTE — Progress Notes (Signed)
HPI The patient presents for follow up of AS.  Since I last saw her she has had no new SOB or chest pain.  She did have one episode of dizziness where the room was spinning. She did not have syncope. She says this is an isolated event. She gets around slowly with her walker. With this she denies any chest pressure, neck or arm discomfort. She doesn't have any palpitations, presyncope or syncope. She denies any shortness of breath, PND or orthopnea.  Allergies  Allergen Reactions  . Ace Inhibitors     REACTION: cough  . Aspirin   . Hydrocodone     REACTION: Hallucinations---minor    Current Outpatient Prescriptions  Medication Sig Dispense Refill  . acetaminophen (TYLENOL) 500 MG tablet Take 1,000 mg by mouth Hofacker. And 1 at bedtime       . aspirin 81 MG tablet Take 1 tablet (81 mg total) by mouth Rochin.  30 tablet    . atorvastatin (LIPITOR) 20 MG tablet Take 1 tablet (20 mg total) by mouth Makepeace.  90 tablet  3  . calcium carbonate (TUMS) 500 MG chewable tablet Chew 1 tablet by mouth as needed.        . carvedilol (COREG) 12.5 MG tablet Take 1 tablet (12.5 mg total) by mouth 2 (two) times Causer with a meal.  180 tablet  3  . cholestyramine (QUESTRAN) 4 G packet TAKE 1 PACKET (4 G TOTAL) BY  MOUTH Brum.  30 packet  6  . diphenoxylate-atropine (LOMOTIL) 2.5-0.025 MG per tablet TAKE 1 OR 2 TABLETS Bressi AS NEEDED FOR DIARRHEA OR LOOSE STOOLS.  60 tablet  11  . famotidine (PEPCID) 20 MG tablet Take 1 tablet (20 mg total) by mouth 2 (two) times Kettles.  60 tablet  11  . glucosamine-chondroitin 500-400 MG tablet Take 1 tablet by mouth 3 (three) times Matherne.      . hydrochlorothiazide (MICROZIDE) 12.5 MG capsule TAKE (1) CAPSULE Justiniano.  90 capsule  1  . levothyroxine (SYNTHROID, LEVOTHROID) 75 MCG tablet 1 BY MOUTH Skoog  90 tablet  3  . loperamide (IMODIUM A-D) 2 MG tablet Take 4 mg by mouth as needed.       . Multiple Vitamin (MULTIVITAMIN) tablet Take 1 tablet by mouth Holzman.         No  current facility-administered medications for this visit.    Past Medical History  Diagnosis Date  . CARCINOMA, BLADDER, TRANSITIONAL CELL 10/24/2007  . HYPERTENSION 01/07/2007  . HYPOTHYROIDISM 01/07/2007  . IBS 05/03/2008  . MIXED HEARING LOSS BILATERAL 09/28/2009  . OSTEOARTHRITIS, KNEE 04/25/2007  . UNSPECIFIED ARTHROPATHY MULTIPLE SITES 10/26/2009  . Heart murmur     Past Surgical History  Procedure Laterality Date  . Cataract extraction      bilateral  . Bunionectomy      bilateral  . Bladder tumor excision    . Tonsillectomy and adenoidectomy      x2  . Belpharoptosis repair    . Cardiovascular stress test      ROS: As stated in the HPI and negative for all other systems.  PHYSICAL EXAM BP 140/60  Pulse 76  Ht 5\' 1"  (1.549 m)  Wt 165 lb (74.844 kg)  BMI 31.19 kg/m2 GENERAL:  Well appearing for her age but starting to look slightly more frail NECK:  No jugular venous distention, waveform within normal limits, carotid upstroke brisk and symmetric, bilateral bruits, no thyromegaly LUNGS:  Clear to auscultation bilaterally CHEST:  Unremarkable HEART:  PMI not displaced or sustained,S1 and S2 within normal limits, no S3, no S4, no clicks, no rubs, apical systolic murmur mid to late peaking radiating out the outflow tract ABD:  Flat, positive bowel sounds normal in frequency in pitch, no bruits, no rebound, no guarding, no midline pulsatile mass, no hepatomegaly, no splenomegaly EXT:  2 plus pulses throughout, no edema, no cyanosis no clubbing  EKG:  Sinus rhythm, rate 76, axis within normal limits, intervals within normal limits, no acute ST-T wave changes.  02/23/2014   ASSESSMENT AND PLAN   Aortic stenosis - Given the fact that she has had  No symptoms, I think we can again follow this clinically. At this point no change in therapy is indicated. She will let me know if she has any of the symptoms of presyncope, shortness of breath or chest pain.  Chest pain - This has  not changed and she had her stress test. It goes away with over-the-counter antacids. No further cardiac evaluation is planned.  HTN - The blood pressure is at target. No change in medications is indicated. We will continue with therapeutic lifestyle changes (TLC).

## 2014-02-24 ENCOUNTER — Telehealth: Payer: Self-pay | Admitting: Cardiology

## 2014-02-24 NOTE — Telephone Encounter (Signed)
Please call,she was seen yesterday.

## 2014-02-24 NOTE — Telephone Encounter (Signed)
Returned a call to patient. She states that yesterday when she was here for her appointment  Dr. Percival Spanish asked if she has any trouble breathing and she told him "No". This morning she did remember that she does have head congestion when she first wakes up in the morning. She 's not sure if this is what he was asking so she thought that she would let him know. I told her that he was more than likely referring to whether she is able to move air through her lungs, however I will pass along this information.

## 2014-02-25 DIAGNOSIS — M1710 Unilateral primary osteoarthritis, unspecified knee: Secondary | ICD-10-CM | POA: Diagnosis not present

## 2014-02-25 NOTE — Addendum Note (Signed)
Addended by: Dolan Amen. on: 02/25/2014 11:49 AM   Modules accepted: Orders

## 2014-03-29 ENCOUNTER — Telehealth: Payer: Self-pay | Admitting: Gastroenterology

## 2014-03-31 NOTE — Telephone Encounter (Signed)
I agree with those rec's thanks.

## 2014-03-31 NOTE — Telephone Encounter (Signed)
The patient called with a complaint of very hard stools, constipation.  She is taking [place] every 3 days.  I advised her to stop the [place] until she is no longer constipated and start 1 dose of miralax Buchan until her stools are soft and no longer constipated. She was told to resume the [place] if she develops diarrhea again.  She will call if this does not help and was advised to follow up she has not been seen since January.  She wants to wait and see how she does making this change before making an appt.

## 2014-04-05 ENCOUNTER — Telehealth: Payer: Self-pay | Admitting: Gastroenterology

## 2014-04-06 NOTE — Telephone Encounter (Signed)
Left message on machine to call back  

## 2014-04-07 NOTE — Telephone Encounter (Signed)
I agree with qhs H2 blocker.  Thanks

## 2014-04-07 NOTE — Telephone Encounter (Signed)
The pt complains of worse reflux at bedtime, she is taking Pepcid twice Preis with good relief during the day but not at night.  She was advised to try zantac at bedtime and scheduled to see Dr Ardis Hughs 06/09/14.  She will call is zantac does not help.

## 2014-04-08 DIAGNOSIS — M1712 Unilateral primary osteoarthritis, left knee: Secondary | ICD-10-CM | POA: Diagnosis not present

## 2014-04-08 DIAGNOSIS — M1711 Unilateral primary osteoarthritis, right knee: Secondary | ICD-10-CM | POA: Diagnosis not present

## 2014-04-21 DIAGNOSIS — Z23 Encounter for immunization: Secondary | ICD-10-CM | POA: Diagnosis not present

## 2014-04-23 ENCOUNTER — Other Ambulatory Visit: Payer: Self-pay

## 2014-05-14 ENCOUNTER — Telehealth: Payer: Self-pay | Admitting: Gastroenterology

## 2014-05-14 NOTE — Telephone Encounter (Signed)
Pt states she has noticed some blood in her stool the past couple of days. States that the toilet bowl was colored this am. Pt scheduled to see Alonza Bogus PA 05/23/14@2 :30pm. Pt aware of appt.

## 2014-05-19 ENCOUNTER — Ambulatory Visit (INDEPENDENT_AMBULATORY_CARE_PROVIDER_SITE_OTHER): Payer: Medicare Other | Admitting: Gastroenterology

## 2014-05-19 ENCOUNTER — Encounter: Payer: Self-pay | Admitting: Gastroenterology

## 2014-05-19 VITALS — BP 120/60 | HR 80 | Ht 61.0 in | Wt 163.2 lb

## 2014-05-19 DIAGNOSIS — K649 Unspecified hemorrhoids: Secondary | ICD-10-CM | POA: Diagnosis not present

## 2014-05-19 DIAGNOSIS — G466 Pure sensory lacunar syndrome: Secondary | ICD-10-CM

## 2014-05-19 DIAGNOSIS — K625 Hemorrhage of anus and rectum: Secondary | ICD-10-CM | POA: Diagnosis not present

## 2014-05-19 DIAGNOSIS — K219 Gastro-esophageal reflux disease without esophagitis: Secondary | ICD-10-CM

## 2014-05-19 DIAGNOSIS — R197 Diarrhea, unspecified: Secondary | ICD-10-CM | POA: Diagnosis not present

## 2014-05-19 MED ORDER — HYDROCORTISONE ACETATE 25 MG RE SUPP: 25.0000 mg | Freq: Two times a day (BID) | RECTAL | Status: DC

## 2014-05-19 NOTE — Patient Instructions (Addendum)
We have sent the following medications to your pharmacy for you to pick up at your convenience: Anusol Suppository   Please keep appointment with Dr. Ardis Hughs

## 2014-05-19 NOTE — Progress Notes (Signed)
05/19/2014 Susan [name] [id number] [birth year]   History of Present Illness:  This is a pleasant 80 year old female who is previously known to Dr. Ardis Hughs. She was put in for this visit today for rectal bleeding, however, we did up discussing her diarrhea and reflux in detail as well. She's had some confusion with her diarrhea and reflux medications. In regards to the rectal bleeding, this occurred only once last week with a normal bowel movement. She saw bright red colored blood in the toilet bowl and on the toilet paper.  Her last colonoscopy was in May 2013 and at that time she was found to have 12-15 small polyps that were removed and were tubular adenomas.  She also had diverticulosis and hemorrhoids. It was not recommended that she have any other surveillance colonoscopies due to her age. She has been treated for her diarrhea empirically as irritable bowel syndrome. As stated above, there has been some confusion with her medications. She has Questran, which she has only been taking every few days. She also has Imodium which she takes on occasion but feels like it does not work quickly. Then, she also has Lomotil, which she takes regularly once or twice [place] when she is out and about. She recently had a bout of constipation a couple of weeks ago and had to stop taking those medications and take some MiraLAX in order to get her bowels moving again.  She also has reflux and takes pepcid 20 mg BID.  Recently was having symptoms despite that medication.  Has been taking Tums prn, which help.  Someone also told her to take Zantac so she has purchased some OTC Zantac (unsure what dose) and has been taking that as well.   Current Medications, Allergies, Past Medical History, Past Surgical History, Family History and Social History were reviewed in [employer] record.   Physical Exam: BP 120/60 mmHg  Pulse 80  Ht 5\' 1"  (1.549 m)  Wt 163 lb 4 oz (74.05 kg)  BMI 30.86  kg/m2 General:  Elderly white female in no acute distress Head: Normocephalic and atraumatic Eyes:  Sclerae anicteric, conjunctiva pink  Ears: Normal auditory acuity Lungs: Clear throughout to auscultation Heart: Regular rate and rhythm Abdomen: Soft, non-distended.  Normal bowel sounds.  Non-tender. Rectal:  Few small external hemorrhoids noted.  No masses felt on DRE.   Musculoskeletal: Symmetrical with no gross deformities  Extremities: No edema  Neurological: Alert oriented x 4, grossly non-focal Psychological:  Alert and cooperative. Normal mood and affect  Assessment and Recommendations: -Rectal bleeding:  Likely hemorrhoidal:  Will treat empirically with anusol suppositories BID for 7 days.   -Diarrhea:  Likely IBS and has been treated as such.  She had some confusion with her medications. We tried to get those straightened out today. She will begin taking the Questran Bruns, discontinue the Imodium, and only use Lomotil as needed. If she finds that she is using the Lomotil frequently, then may want to consider increasing Questran to twice a day. -GERD:  She also had some confusion on these medications as well. She takes Pepcid 20 mg twice a day but also recently purchased some Zantac over-the-counter, unknown dose and takes Tums as needed. She is going to discontinue the Pepcid since this did not seem to be helping her. She just bought a large supply of Zantac so she will take equivalent to 150 mg Dozal to start. Can use Tums as needed.  *She already has a  follow-up visit scheduled for December and will keep that appointment for now.

## 2014-05-20 NOTE — Progress Notes (Signed)
i agree with the above note, plan 

## 2014-05-21 ENCOUNTER — Other Ambulatory Visit: Payer: Self-pay | Admitting: *Deleted

## 2014-05-21 ENCOUNTER — Telehealth: Payer: Self-pay | Admitting: *Deleted

## 2014-05-21 MED ORDER — HYDROCORTISONE 2.5 % RE CREA: TOPICAL_CREAM | RECTAL | Status: DC

## 2014-05-21 NOTE — Telephone Encounter (Signed)
[place], and advised the pharmacist Maudie Mercury it is okay for them to substitute the Proctosol HC 2.5 % cream. We originally sent prescription for Anusol [facility] suppositories.  The cost would have been $400.00 . We phoned in the Procotol cream prescription today.

## 2014-05-27 ENCOUNTER — Other Ambulatory Visit: Payer: Self-pay | Admitting: Internal Medicine

## 2014-05-29 ENCOUNTER — Other Ambulatory Visit: Payer: Self-pay | Admitting: Internal Medicine

## 2014-06-09 ENCOUNTER — Ambulatory Visit: Payer: Medicare Other | Admitting: Gastroenterology

## 2014-06-09 NOTE — Progress Notes (Signed)
Patient ID: Susan [name], female   DOB: 30-Jul-1918, 80 y.o.   MRN: [id number] Patient no-showed today's appointment; provider notified for review of record.     Please send a no-show letter to the patient recommending that they reschedule the appointment.   Also send a letter to the referring provider alerting them of the NO SHOW.

## 2014-06-10 ENCOUNTER — Telehealth: Payer: Self-pay | Admitting: Gastroenterology

## 2014-06-10 NOTE — Telephone Encounter (Signed)
Agree with plan.  Thank you,  Jess

## 2014-06-10 NOTE — Telephone Encounter (Signed)
Spoke with patient and she got the time mixed up on her f/u OV and missed the appointment. She states she has been using her Proctosol cream for 2 weeks. She read on the tube not to use more than 2 weeks unless your doctor said you could. She has been doing well but the last few days, she has seen a tiny spot of blood on tissue when she wipes. Patient will continue using the cream. Rescheduled her with Dr. Ardis Hughs for f/u OV on 06/30/14 at 1:45 PM.

## 2014-06-30 ENCOUNTER — Ambulatory Visit (INDEPENDENT_AMBULATORY_CARE_PROVIDER_SITE_OTHER): Payer: Medicare Other | Admitting: Gastroenterology

## 2014-06-30 ENCOUNTER — Encounter: Payer: Self-pay | Admitting: Gastroenterology

## 2014-06-30 VITALS — BP 108/62 | HR 70 | Ht 61.0 in | Wt 161.0 lb

## 2014-06-30 DIAGNOSIS — G466 Pure sensory lacunar syndrome: Secondary | ICD-10-CM

## 2014-06-30 DIAGNOSIS — K219 Gastro-esophageal reflux disease without esophagitis: Secondary | ICD-10-CM | POA: Diagnosis not present

## 2014-06-30 NOTE — Patient Instructions (Addendum)
Use preparation H on as needed basis. Take famotidine on as needed basis for indigestion. Return to see Dr. Ardis Hughs as needed.

## 2014-06-30 NOTE — Progress Notes (Signed)
Review of pertinent gastrointestinal problems:  1. Colonic adenomas. Colonoscopy 2006 by Dr. Leonie Douglas removed 3 subcentimeter adenomas. Usual followup would be at 3 year interval however given her age we decided not to repeat colonoscopy as yet. She is also very uninterested in repeat colonoscopy. 11/2011 colonoscopy mulitple small TAs all removed, TI normal, normal mucosa biopsied, no microsopic colitis, added questran with very good response  2. Mild, chronic diarrhea: 2011, 2012. Imodium, Lomotil both help   HPI: This is a  very pleasant 80 year old woman whom I last saw about a year ago. She was here in the office one month ago and saw Janett Billow. She was found to have minor hemorrhoids. Her GERD and constipation medicines were adjusted  Saw small bit of red blood on tissue paper today.  Not sure if the prescription hemorrhoid ointment.  Has been taking H2 blocker on as needed basis (famotidine).  Past Medical History  Diagnosis Date  . CARCINOMA, BLADDER, TRANSITIONAL CELL 10/24/2007  . HYPERTENSION 01/07/2007  . HYPOTHYROIDISM 01/07/2007  . IBS 05/03/2008  . MIXED HEARING LOSS BILATERAL 09/28/2009  . OSTEOARTHRITIS, KNEE 04/25/2007  . UNSPECIFIED ARTHROPATHY MULTIPLE SITES 10/26/2009  . Aortic stenosis     Past Surgical History  Procedure Laterality Date  . Cataract extraction      bilateral  . Bunionectomy      bilateral  . Bladder tumor excision    . Tonsillectomy and adenoidectomy      x2  . Belpharoptosis repair      Current Outpatient Prescriptions  Medication Sig Dispense Refill  . acetaminophen (TYLENOL) 500 MG tablet Take 1,000 mg by mouth Nolley. And 1 at bedtime     . aspirin 81 MG tablet Take 1 tablet (81 mg total) by mouth Korson. 30 tablet   . atorvastatin (LIPITOR) 20 MG tablet Take 1 tablet (20 mg total) by mouth Philippi. 90 tablet 3  . calcium carbonate (TUMS) 500 MG chewable tablet Chew 1 tablet by mouth as needed.      . carvedilol (COREG) 12.5 MG tablet TAKE   (1)  TABLET TWICE A DAY WITH MEALS (BREAKFAST AND SUPPER) 180 tablet 0  . cholestyramine (QUESTRAN) 4 G packet TAKE 1 PACKET (4 G TOTAL) BY  MOUTH Jarecki. 30 packet 6  . diphenoxylate-atropine (LOMOTIL) 2.5-0.025 MG per tablet TAKE 1 OR 2 TABLETS [place] AS NEEDED FOR DIARRHEA OR LOOSE STOOLS. 60 tablet 11  . famotidine (PEPCID) 20 MG tablet Take 1 tablet (20 mg total) by mouth 2 (two) times Regino. 60 tablet 11  . glucosamine-chondroitin 500-400 MG tablet Take 1 tablet by mouth 2 (two) times Bogen.     . hydrochlorothiazide (MICROZIDE) 12.5 MG capsule TAKE (1) CAPSULE Allums. 90 capsule 1  . hydrocortisone (ANUSOL-HC) 25 MG suppository Place 1 suppository (25 mg total) rectally 2 (two) times [place]. 24 suppository 1  . hydrocortisone (PROCTOSOL HC) 2.5 % rectal cream Use the cream with applicator, insert small amount into the rectal area 2-3 times Perlmutter. 30 g 1  . levothyroxine (SYNTHROID, LEVOTHROID) 75 MCG tablet TAKE 1 TABLET EACH DAY. 90 tablet 0  . loperamide (IMODIUM A-D) 2 MG tablet Take 4 mg by mouth as needed.     . Multiple Vitamin (MULTIVITAMIN) tablet Take 1 tablet by mouth Belcastro.      . Ranitidine HCl (ZANTAC 75 PO) Take by mouth as directed.     No current facility-administered medications for this visit.    Allergies as of 06/30/2014 - Review Complete 06/30/2014  Allergen Reaction Noted  . Ace inhibitors  07/19/2008  . Hydrocodone  06/09/2008    Family History  Problem Relation Age of Onset  . Stomach cancer Mother   . Arthritis Mother   . Leukemia Father   . Kidney failure Father   . Anuerysm Brother     brain  . ALS Sister   . Stomach cancer Maternal Aunt   . Esophageal cancer Neg Hx   . Rectal cancer Neg Hx   . Colon cancer Daughter     History   Social History  . Marital Status: Widowed    Spouse Name: N/A    Number of Children: 3  . Years of Education: N/A   Occupational History  . retired    Social History Main Topics  . Smoking status: Former Smoker  -- 0.50 packs/day for 20 years    Types: Cigarettes    Quit date: 09/05/1973  . Smokeless tobacco: Never Used  . Alcohol Use: Yes     Comment: ocass  . Drug Use: No  . Sexual Activity: Not on file   Other Topics Concern  . Not on file   Social History Narrative      Physical Exam: BP 108/62 mmHg  Pulse 70  Ht 5\' 1"  (1.549 m)  Wt 161 lb (73.029 kg)  BMI 30.44 kg/m2 Constitutional: generally well-appearing Psychiatric: alert and oriented x3 Abdomen: soft, nontender, nondistended, no obvious ascites, no peritoneal signs, normal bowel sounds     Assessment and plan: 80 y.o. female with minor rectal bleeding, GERD, loose stools  She will continue taking cholestyramine every third day which is what she has been doing lately. She will take H2 blocker once to twice a day on a scheduled basis. Her rectal bleeding is very minor she only sees a small spot of blood on tissue paper periodically. I recommended that for that amount of bleeding she does not need to put any ointments on her backside however certainly if the bleeding gets more intense she can call or start Preparation H over-the-counter. She had a colonoscopy 2 years ago I do not think that needs to be repeated now.

## 2014-07-06 ENCOUNTER — Other Ambulatory Visit: Payer: Self-pay | Admitting: Internal Medicine

## 2014-07-14 ENCOUNTER — Other Ambulatory Visit: Payer: Self-pay | Admitting: Gastroenterology

## 2014-07-21 ENCOUNTER — Telehealth: Payer: Self-pay | Admitting: Gastroenterology

## 2014-07-21 NOTE — Telephone Encounter (Signed)
Per Dr Ardis Hughs note the pt is to use prep H if she sees bleeding again,  She has had some slight blood and dark stools.  She will use prep H and call back if this worsens or continues

## 2014-08-03 ENCOUNTER — Other Ambulatory Visit: Payer: Self-pay | Admitting: Gastroenterology

## 2014-08-12 ENCOUNTER — Other Ambulatory Visit: Payer: Self-pay

## 2014-08-12 MED ORDER — ATORVASTATIN CALCIUM 20 MG PO TABS: 20.0000 mg | ORAL_TABLET | Freq: Every day | ORAL | Status: DC

## 2014-08-12 NOTE — Telephone Encounter (Signed)
Rx request for atrovastatin 20 mg tablet-Take 1 tablet once Golaszewski #[id number]  Pharm:  [facility]   Rx sent to pharmacy.

## 2014-08-30 ENCOUNTER — Other Ambulatory Visit: Payer: Self-pay | Admitting: Internal Medicine

## 2014-09-09 DIAGNOSIS — M1711 Unilateral primary osteoarthritis, right knee: Secondary | ICD-10-CM | POA: Diagnosis not present

## 2014-09-15 DIAGNOSIS — N3942 Incontinence without sensory awareness: Secondary | ICD-10-CM | POA: Diagnosis not present

## 2014-09-15 DIAGNOSIS — Z8551 Personal history of malignant neoplasm of bladder: Secondary | ICD-10-CM | POA: Diagnosis not present

## 2014-09-17 DIAGNOSIS — M1711 Unilateral primary osteoarthritis, right knee: Secondary | ICD-10-CM | POA: Diagnosis not present

## 2014-09-24 DIAGNOSIS — M1711 Unilateral primary osteoarthritis, right knee: Secondary | ICD-10-CM | POA: Diagnosis not present

## 2014-10-06 ENCOUNTER — Other Ambulatory Visit: Payer: Self-pay | Admitting: Internal Medicine

## 2014-10-12 ENCOUNTER — Other Ambulatory Visit: Payer: Self-pay | Admitting: Gastroenterology

## 2014-10-14 ENCOUNTER — Other Ambulatory Visit: Payer: Self-pay | Admitting: Gastroenterology

## 2014-11-05 DIAGNOSIS — M1711 Unilateral primary osteoarthritis, right knee: Secondary | ICD-10-CM | POA: Diagnosis not present

## 2014-12-01 ENCOUNTER — Other Ambulatory Visit: Payer: Self-pay | Admitting: Internal Medicine

## 2014-12-08 ENCOUNTER — Other Ambulatory Visit: Payer: Self-pay | Admitting: Internal Medicine

## 2014-12-09 DIAGNOSIS — H04123 Dry eye syndrome of bilateral lacrimal glands: Secondary | ICD-10-CM | POA: Diagnosis not present

## 2014-12-09 DIAGNOSIS — Z961 Presence of intraocular lens: Secondary | ICD-10-CM | POA: Diagnosis not present

## 2014-12-09 DIAGNOSIS — H3531 Nonexudative age-related macular degeneration: Secondary | ICD-10-CM | POA: Diagnosis not present

## 2014-12-17 ENCOUNTER — Ambulatory Visit (INDEPENDENT_AMBULATORY_CARE_PROVIDER_SITE_OTHER): Payer: Medicare Other | Admitting: Internal Medicine

## 2014-12-17 ENCOUNTER — Encounter: Payer: Self-pay | Admitting: Internal Medicine

## 2014-12-17 VITALS — BP 130/78 | HR 70 | Temp 98.2°F | Wt 158.0 lb

## 2014-12-17 DIAGNOSIS — I10 Essential (primary) hypertension: Secondary | ICD-10-CM

## 2014-12-17 DIAGNOSIS — K589 Irritable bowel syndrome without diarrhea: Secondary | ICD-10-CM | POA: Diagnosis not present

## 2014-12-17 DIAGNOSIS — I350 Nonrheumatic aortic (valve) stenosis: Secondary | ICD-10-CM | POA: Diagnosis not present

## 2014-12-17 DIAGNOSIS — E039 Hypothyroidism, unspecified: Secondary | ICD-10-CM | POA: Diagnosis not present

## 2014-12-17 NOTE — Patient Instructions (Signed)
Limit your sodium (Salt) intake  Avoids foods high in acid such as tomatoes citrus juices, and spicy foods.  Avoid eating within two hours of lying down or before exercising.  Do not overheat.  Try smaller more frequent meals.  If symptoms persist, elevate the head of her bed 12 inches while sleeping.  Return in 6 months for follow-up   Cardiology and GI follow-up as scheduled

## 2014-12-17 NOTE — Progress Notes (Signed)
Subjective:    Patient ID: Susan [name], female    DOB: 1918-08-28, 81 y.o.   MRN: [id number]  HPI  81 year old patient who is seen today for her biannual follow-up.  She is followed by cardiology for aortic stenosis.  She has a history of essential hypertension and cerebrovascular disease.  She is also followed by GI for IBS.  This was her chief concern today with alternating diarrhea and constipation. In general doing quite well. She has a history of reflux that is controlled on H2 blocker therapy Denies any cardiac symptoms  Past Medical History  Diagnosis Date  . CARCINOMA, BLADDER, TRANSITIONAL CELL 10/24/2007  . HYPERTENSION 01/07/2007  . HYPOTHYROIDISM 01/07/2007  . IBS 05/03/2008  . MIXED HEARING LOSS BILATERAL 09/28/2009  . OSTEOARTHRITIS, KNEE 04/25/2007  . UNSPECIFIED ARTHROPATHY MULTIPLE SITES 10/26/2009  . Aortic stenosis     History   Social History  . Marital Status: Widowed    Spouse Name: N/A  . Number of Children: 3  . Years of Education: N/A   Occupational History  . retired    Social History Main Topics  . Smoking status: Former Smoker -- 0.50 packs/day for 20 years    Types: Cigarettes    Quit date: 09/05/1973  . Smokeless tobacco: Never Used  . Alcohol Use: Yes     Comment: ocass  . Drug Use: No  . Sexual Activity: Not on file   Other Topics Concern  . Not on file   Social History Narrative    Past Surgical History  Procedure Laterality Date  . Cataract extraction      bilateral  . Bunionectomy      bilateral  . Bladder tumor excision    . Tonsillectomy and adenoidectomy      x2  . Belpharoptosis repair      Family History  Problem Relation Age of Onset  . Stomach cancer Mother   . Arthritis Mother   . Leukemia Father   . Kidney failure Father   . Anuerysm Brother     brain  . ALS Sister   . Stomach cancer Maternal Aunt   . Esophageal cancer Neg Hx   . Rectal cancer Neg Hx   . Colon cancer Daughter     Allergies    Allergen Reactions  . Ace Inhibitors     REACTION: cough  . Hydrocodone     REACTION: Hallucinations---minor    Current Outpatient Prescriptions on File Prior to Visit  Medication Sig Dispense Refill  . acetaminophen (TYLENOL) 500 MG tablet Take 1,000 mg by mouth [place]. And 1 at bedtime     . aspirin 81 MG tablet Take 1 tablet (81 mg total) by mouth Markus. 30 tablet   . atorvastatin (LIPITOR) 20 MG tablet Take 1 tablet (20 mg total) by mouth Baumgarten. 90 tablet 2  . calcium carbonate (TUMS) 500 MG chewable tablet Chew 1 tablet by mouth as needed.      . carvedilol (COREG) 12.5 MG tablet TAKE  (1)  TABLET TWICE A DAY WITH MEALS (BREAKFAST AND SUPPER) 180 tablet 0  . cholestyramine (QUESTRAN) 4 G packet TAKE ONE PACKET Parodi. 30 each 11  . diphenoxylate-atropine (LOMOTIL) 2.5-0.025 MG per tablet TAKE 1 OR 2 TABLETS Tober AS NEEDED FOR DIARRHEA OR LOOSE STOOLS. 60 tablet 0  . famotidine (PEPCID) 20 MG tablet TAKE 1 TABLET TWICE Nevers. 60 tablet 6  . glucosamine-chondroitin 500-400 MG tablet Take 1 tablet by mouth 2 (two) times Lazar.     Marland Kitchen  hydrocortisone (ANUSOL-HC) 25 MG suppository Place 1 suppository (25 mg total) rectally 2 (two) times Raffo. 24 suppository 1  . hydrocortisone (PROCTOSOL HC) 2.5 % rectal cream Use the cream with applicator, insert small amount into the rectal area 2-3 times Bubb. 30 g 1  . levothyroxine (SYNTHROID, LEVOTHROID) 75 MCG tablet TAKE 1 TABLET EACH DAY. 90 tablet 1  . loperamide (IMODIUM A-D) 2 MG tablet Take 4 mg by mouth as needed.     . Multiple Vitamin (MULTIVITAMIN) tablet Take 1 tablet by mouth Fortner.      . Ranitidine HCl (ZANTAC 75 PO) Take by mouth as directed.    . hydrochlorothiazide (MICROZIDE) 12.5 MG capsule TAKE (1) CAPSULE Twitty. 90 capsule 0   No current facility-administered medications on file prior to visit.    BP 130/78 mmHg  Pulse 70  Temp(Src) 98.2 F (36.8 C) (Oral)  Wt 158 lb (71.668 kg)      Review of Systems   Constitutional: Negative.   HENT: Negative for congestion, dental problem, hearing loss, rhinorrhea, sinus pressure, sore throat and tinnitus.   Eyes: Negative for pain, discharge and visual disturbance.  Respiratory: Negative for cough and shortness of breath.   Cardiovascular: Negative for chest pain, palpitations and leg swelling.  Gastrointestinal: Positive for diarrhea and constipation. Negative for nausea, vomiting, abdominal pain, blood in stool and abdominal distention.  Genitourinary: Negative for dysuria, urgency, frequency, hematuria, flank pain, vaginal bleeding, vaginal discharge, difficulty urinating, vaginal pain and pelvic pain.  Musculoskeletal: Positive for arthralgias and gait problem. Negative for joint swelling.  Skin: Negative for rash.  Neurological: Negative for dizziness, syncope, speech difficulty, weakness, numbness and headaches.  Hematological: Negative for adenopathy.  Psychiatric/Behavioral: Negative for behavioral problems, dysphoric mood and agitation. The patient is not nervous/anxious.        Objective:   Physical Exam  Constitutional: She is oriented to person, place, and time. She appears well-developed and well-nourished.  HENT:  Head: Normocephalic.  Right Ear: External ear normal.  Left Ear: External ear normal.  Mouth/Throat: Oropharynx is clear and moist.  Eyes: Conjunctivae and EOM are normal. Pupils are equal, round, and reactive to light.  Neck: Normal range of motion. Neck supple. No thyromegaly present.  Cardiovascular: Normal rate, regular rhythm and intact distal pulses.   Murmur heard. Grade 4-2-5 systolic murmur with wide radiation and into the carotid distribution  Pulmonary/Chest: Effort normal and breath sounds normal.  Abdominal: Soft. Bowel sounds are normal. She exhibits no mass. There is no tenderness.  Musculoskeletal: Normal range of motion. She exhibits no edema.  Lymphadenopathy:    She has no cervical adenopathy.   Neurological: She is alert and oriented to person, place, and time.  Skin: Skin is warm and dry. No rash noted.  Psychiatric: She has a normal mood and affect. Her behavior is normal.          Assessment & Plan:   Aortic stenosis Essential hypertension, stable IBS GERD  No change in medical regimen Follow GI and cardiology as planned  Return here in 6 months or as needed

## 2014-12-17 NOTE — Progress Notes (Signed)
Pre visit review using our clinic review tool, if applicable. No additional management support is needed unless otherwise documented below in the visit note. 

## 2015-01-04 ENCOUNTER — Other Ambulatory Visit: Payer: Self-pay | Admitting: Internal Medicine

## 2015-01-11 DIAGNOSIS — M1711 Unilateral primary osteoarthritis, right knee: Secondary | ICD-10-CM | POA: Diagnosis not present

## 2015-02-02 DIAGNOSIS — L820 Inflamed seborrheic keratosis: Secondary | ICD-10-CM | POA: Diagnosis not present

## 2015-02-02 DIAGNOSIS — L821 Other seborrheic keratosis: Secondary | ICD-10-CM | POA: Diagnosis not present

## 2015-02-02 DIAGNOSIS — D1801 Hemangioma of skin and subcutaneous tissue: Secondary | ICD-10-CM | POA: Diagnosis not present

## 2015-02-02 DIAGNOSIS — Z85828 Personal history of other malignant neoplasm of skin: Secondary | ICD-10-CM | POA: Diagnosis not present

## 2015-02-02 DIAGNOSIS — L720 Epidermal cyst: Secondary | ICD-10-CM | POA: Diagnosis not present

## 2015-02-07 ENCOUNTER — Encounter: Payer: Self-pay | Admitting: Internal Medicine

## 2015-02-07 ENCOUNTER — Ambulatory Visit (INDEPENDENT_AMBULATORY_CARE_PROVIDER_SITE_OTHER): Payer: Medicare Other | Admitting: Internal Medicine

## 2015-02-07 VITALS — BP 118/70 | HR 84 | Temp 98.4°F | Resp 20 | Ht 61.0 in | Wt 165.0 lb

## 2015-02-07 DIAGNOSIS — D649 Anemia, unspecified: Secondary | ICD-10-CM

## 2015-02-07 DIAGNOSIS — I350 Nonrheumatic aortic (valve) stenosis: Secondary | ICD-10-CM | POA: Diagnosis not present

## 2015-02-07 DIAGNOSIS — E039 Hypothyroidism, unspecified: Secondary | ICD-10-CM | POA: Diagnosis not present

## 2015-02-07 DIAGNOSIS — R197 Diarrhea, unspecified: Secondary | ICD-10-CM

## 2015-02-07 DIAGNOSIS — I10 Essential (primary) hypertension: Secondary | ICD-10-CM | POA: Diagnosis not present

## 2015-02-07 LAB — COMPREHENSIVE METABOLIC PANEL
ALBUMIN: 3.9 g/dL (ref 3.5–5.2)
ALK PHOS: 68 U/L (ref 39–117)
ALT: 13 U/L (ref 0–35)
AST: 17 U/L (ref 0–37)
BUN: 17 mg/dL (ref 6–23)
CALCIUM: 9.6 mg/dL (ref 8.4–10.5)
CO2: 29 mEq/L (ref 19–32)
CREATININE: 0.77 mg/dL (ref 0.40–1.20)
Chloride: 105 mEq/L (ref 96–112)
GFR: 74.22 mL/min (ref >60.00)
Glucose, Bld: 92 mg/dL (ref 70–99)
POTASSIUM: 3.5 meq/L (ref 3.5–5.1)
SODIUM: 143 meq/L (ref 135–145)
Total Bilirubin: 0.8 mg/dL (ref 0.2–1.2)
Total Protein: 6.4 g/dL (ref 6.0–8.3)

## 2015-02-07 LAB — CBC WITH DIFFERENTIAL/PLATELET
BASOS PCT: 0.2 % (ref 0.0–3.0)
Basophils Absolute: 0 10*3/uL (ref 0.0–0.1)
EOS ABS: 0.1 10*3/uL (ref 0.0–0.7)
EOS PCT: 1.1 % (ref 0.0–5.0)
HCT: 35.6 % — ABNORMAL LOW (ref 36.0–46.0)
Hemoglobin: 11.8 g/dL — ABNORMAL LOW (ref 12.0–15.0)
Lymphocytes Relative: 32 % (ref 12.0–46.0)
Lymphs Abs: 2.7 10*3/uL (ref 0.7–4.0)
MCHC: 33.2 g/dL (ref 30.0–36.0)
MCV: 87.7 fl (ref 78.0–100.0)
MONO ABS: 0.7 10*3/uL (ref 0.1–1.0)
Monocytes Relative: 8.2 % (ref 3.0–12.0)
Neutro Abs: 5 10*3/uL (ref 1.4–7.7)
Neutrophils Relative %: 58.5 % (ref 43.0–77.0)
PLATELETS: 102 10*3/uL — AB (ref 150.0–400.0)
RBC: 4.06 Mil/uL (ref 3.87–5.11)
RDW: 15 % (ref 11.5–15.5)
WBC: 8.5 10*3/uL (ref 4.0–10.5)

## 2015-02-07 LAB — TSH: TSH: 0.7 u[IU]/mL (ref 0.35–4.50)

## 2015-02-07 NOTE — Patient Instructions (Addendum)
Discontinue hydrochlorothiazide  Drink as much fluid as you  can tolerate over the next few days  Return in 3 months for follow-up   Please check your blood pressure on a regular basis.  If it is consistently greater than 150/90, please make an office appointment.   cardiology follow-up as scheduled

## 2015-02-07 NOTE — Progress Notes (Signed)
Subjective:    Patient ID: Eloina Ergle Heitmeyer, female    DOB: [birth year], 81 y.o.   MRN: [id number]  HPI  BP Readings from Last 3 Encounters:  02/07/15 118/70  12/17/14 130/78  06/30/14 59/6   81 year old patient has a history of essential hypertension as well as aortic stenosis.  The patient has been treated by ENT in the past for sialoadenitis and recently the patient has some swelling involving the right jaw area.  She had some 81 year old antibiotics that she took due to the swelling.  There was left over from an episode in the past.  Today she developed several episodes of diarrhea.  Her appetite has been poor.  She experienced an episode of dizziness and lightheadedness when she stood.  There is no syncope.  She is seen here today with a chief complaint of dizziness. She has a history of essential hypertension that has been controlled on hydrochlorothiazide 12 point 5 mg Blount She does have remote history of anemia.  No recent lab  Past Medical History  Diagnosis Date  . CARCINOMA, BLADDER, TRANSITIONAL CELL 10/24/2007  . HYPERTENSION 01/07/2007  . HYPOTHYROIDISM 01/07/2007  . IBS 05/03/2008  . MIXED HEARING LOSS BILATERAL 09/28/2009  . OSTEOARTHRITIS, KNEE 04/25/2007  . UNSPECIFIED ARTHROPATHY MULTIPLE SITES 10/26/2009  . Aortic stenosis     History   Social History  . Marital Status: Widowed    Spouse Name: N/A  . Number of Children: 3  . Years of Education: N/A   Occupational History  . retired    Social History Main Topics  . Smoking status: Former Smoker -- 0.50 packs/day for 20 years    Types: Cigarettes    Quit date: 09/05/1973  . Smokeless tobacco: Never Used  . Alcohol Use: Yes     Comment: ocass  . Drug Use: No  . Sexual Activity: Not on file   Other Topics Concern  . Not on file   Social History Narrative    Past Surgical History  Procedure Laterality Date  . Cataract extraction      bilateral  . Bunionectomy      bilateral  . Bladder tumor  excision    . Tonsillectomy and adenoidectomy      x2  . Belpharoptosis repair      Family History  Problem Relation Age of Onset  . Stomach cancer Mother   . Arthritis Mother   . Leukemia Father   . Kidney failure Father   . Anuerysm Brother     brain  . ALS Sister   . Stomach cancer Maternal Aunt   . Esophageal cancer Neg Hx   . Rectal cancer Neg Hx   . Colon cancer Daughter     Allergies  Allergen Reactions  . Ace Inhibitors     REACTION: cough  . Hydrocodone     REACTION: Hallucinations---minor    Current Outpatient Prescriptions on File Prior to Visit  Medication Sig Dispense Refill  . acetaminophen (TYLENOL) 500 MG tablet Take 1,000 mg by mouth Baumbach. And 1 at bedtime     . aspirin 81 MG tablet Take 1 tablet (81 mg total) by mouth Manzi. 30 tablet   . atorvastatin (LIPITOR) 20 MG tablet Take 1 tablet (20 mg total) by mouth Blish. 90 tablet 2  . calcium carbonate (TUMS) 500 MG chewable tablet Chew 1 tablet by mouth as needed.      . carvedilol (COREG) 12.5 MG tablet TAKE  (1)  TABLET TWICE A DAY  WITH MEALS (BREAKFAST AND SUPPER) 180 tablet 0  . cholestyramine (QUESTRAN) 4 G packet TAKE ONE PACKET Malter. 30 each 11  . diphenoxylate-atropine (LOMOTIL) 2.5-0.025 MG per tablet TAKE 1 OR 2 TABLETS Randhawa AS NEEDED FOR DIARRHEA OR LOOSE STOOLS. 60 tablet 0  . famotidine (PEPCID) 20 MG tablet TAKE 1 TABLET TWICE Glasser. 60 tablet 6  . glucosamine-chondroitin 500-400 MG tablet Take 1 tablet by mouth 2 (two) times Presutti.     . hydrochlorothiazide (MICROZIDE) 12.5 MG capsule TAKE (1) CAPSULE Marsalis. 90 capsule 3  . hydrocortisone (ANUSOL-HC) 25 MG suppository Place 1 suppository (25 mg total) rectally 2 (two) times Gurevich. 24 suppository 1  . hydrocortisone (PROCTOSOL HC) 2.5 % rectal cream Use the cream with applicator, insert small amount into the rectal area 2-3 times Eidson. 30 g 1  . levothyroxine (SYNTHROID, LEVOTHROID) 75 MCG tablet TAKE 1 TABLET EACH DAY. 90 tablet 1  .  loperamide (IMODIUM A-D) 2 MG tablet Take 4 mg by mouth as needed.     . Multiple Vitamin (MULTIVITAMIN) tablet Take 1 tablet by mouth Moye.      . Ranitidine HCl (ZANTAC 75 PO) Take by mouth as directed.     No current facility-administered medications on file prior to visit.    BP 118/70 mmHg  Pulse 84  Temp(Src) 98.4 F (36.9 C) (Oral)  Resp 20  Ht 5\' 1"  (1.549 m)  Wt 165 lb (74.844 kg)  BMI 31.19 kg/m2  SpO2 96%      Review of Systems  Constitutional: Negative.   HENT: Negative for congestion, dental problem, hearing loss, rhinorrhea, sinus pressure, sore throat and tinnitus.   Eyes: Negative for pain, discharge and visual disturbance.  Respiratory: Negative for cough and shortness of breath.   Cardiovascular: Negative for chest pain, palpitations and leg swelling.  Gastrointestinal: Positive for diarrhea. Negative for nausea, vomiting, abdominal pain, constipation, blood in stool and abdominal distention.  Genitourinary: Negative for dysuria, urgency, frequency, hematuria, flank pain, vaginal bleeding, vaginal discharge, difficulty urinating, vaginal pain and pelvic pain.  Musculoskeletal: Negative for joint swelling, arthralgias and gait problem.  Skin: Negative for rash.  Neurological: Positive for dizziness and light-headedness. Negative for syncope, speech difficulty, weakness, numbness and headaches.  Hematological: Negative for adenopathy.  Psychiatric/Behavioral: Negative for behavioral problems, dysphoric mood and agitation. The patient is not nervous/anxious.        Objective:   Physical Exam  Constitutional: She is oriented to person, place, and time. She appears well-developed and well-nourished.  Blood pressure 110/70 sitting.  Orthostatic changes not attempted  HENT:  Head: Normocephalic.  Right Ear: External ear normal.  Left Ear: External ear normal.  Mouth/Throat: Oropharynx is clear and moist.  Eyes: Conjunctivae and EOM are normal. Pupils are  equal, round, and reactive to light.  Neck: Normal range of motion. Neck supple. No thyromegaly present.  No cervical adenopathy or masses  Cardiovascular: Normal rate, regular rhythm and intact distal pulses.   Murmur heard. Grade 3/6 systolic murmur loudest at the primary aortic area  Pulmonary/Chest: Effort normal and breath sounds normal.  Abdominal: Soft. Bowel sounds are normal. She exhibits no mass. There is no tenderness.  Musculoskeletal: Normal range of motion.  Lymphadenopathy:    She has no cervical adenopathy.  Neurological: She is alert and oriented to person, place, and time.  Skin: Skin is warm and dry. No rash noted.  Psychiatric: She has a normal mood and affect. Her behavior is normal.  Assessment & Plan:   Essential hypertension.  Blood pressure low normal today in the setting of recent diarrhea.  Discontinue hydrochlorothiazide.   Will monitor blood pressures carefully;  Consider up titration of Coreg or alternative BP medication if needed Dizziness/lightheadedness.  Probably secondary to orthostatic hypotension.  Check screening lab Hypothyroidism.  Check TSH History of aortic stenosis.  Will discontinue diuretic therapy and avoid in the future.  Cardiology follow-up next month as scheduled History of anemia.  Will check a CBC

## 2015-02-07 NOTE — Progress Notes (Signed)
Pre visit review using our clinic review tool, if applicable. No additional management support is needed unless otherwise documented below in the visit note. 

## 2015-03-01 ENCOUNTER — Ambulatory Visit (INDEPENDENT_AMBULATORY_CARE_PROVIDER_SITE_OTHER): Payer: Medicare Other | Admitting: Cardiology

## 2015-03-01 ENCOUNTER — Encounter: Payer: Self-pay | Admitting: Cardiology

## 2015-03-01 VITALS — BP 130/60 | HR 66 | Ht 61.0 in | Wt 162.5 lb

## 2015-03-01 DIAGNOSIS — I10 Essential (primary) hypertension: Secondary | ICD-10-CM

## 2015-03-01 NOTE — Progress Notes (Signed)
HPI The patient presents for follow up of AS.  Since I last saw her she has had no new SOB or chest pain.  She did have lightheadedness and her blood pressure would run in the 110s. She's been symptomatic with this. Her primary provider stopped HCTZ. Her pressures been slowly coming up and she feels better with this. She gets around with her walker and sometimes rides a motorized scooter. She denies any resting shortness of breath, PND or orthopnea. She's not having palpitations, presyncope or syncope.  Allergies  Allergen Reactions  . Ace Inhibitors     REACTION: cough  . Hydrocodone     REACTION: Hallucinations---minor    Current Outpatient Prescriptions  Medication Sig Dispense Refill  . acetaminophen (TYLENOL) 500 MG tablet Take 1,000 mg by mouth Agresti. And 1 at bedtime     . aspirin 81 MG tablet Take 1 tablet (81 mg total) by mouth Preast. 30 tablet   . atorvastatin (LIPITOR) 20 MG tablet Take 1 tablet (20 mg total) by mouth Olthoff. 90 tablet 2  . calcium carbonate (TUMS) 500 MG chewable tablet Chew 1 tablet by mouth as needed.      . carvedilol (COREG) 12.5 MG tablet TAKE  (1)  TABLET TWICE A DAY WITH MEALS (BREAKFAST AND SUPPER) 180 tablet 0  . cholestyramine (QUESTRAN) 4 G packet TAKE ONE PACKET Buescher. 30 each 11  . diphenoxylate-atropine (LOMOTIL) 2.5-0.025 MG per tablet TAKE 1 OR 2 TABLETS Coone AS NEEDED FOR DIARRHEA OR LOOSE STOOLS. 60 tablet 0  . famotidine (PEPCID) 20 MG tablet TAKE 1 TABLET TWICE Declercq. 60 tablet 6  . glucosamine-chondroitin 500-400 MG tablet Take 1 tablet by mouth 2 (two) times Grantham.     . hydrocortisone (ANUSOL-HC) 25 MG suppository Place 1 suppository (25 mg total) rectally 2 (two) times Lochner. 24 suppository 1  . hydrocortisone (PROCTOSOL HC) 2.5 % rectal cream Use the cream with applicator, insert small amount into the rectal area 2-3 times Rumore. 30 g 1  . levothyroxine (SYNTHROID, LEVOTHROID) 75 MCG tablet TAKE 1 TABLET EACH DAY. 90 tablet 1  .  loperamide (IMODIUM A-D) 2 MG tablet Take 4 mg by mouth as needed.     . Multiple Vitamin (MULTIVITAMIN) tablet Take 1 tablet by mouth Kerschner.      . Ranitidine HCl (ZANTAC 75 PO) Take by mouth as directed.     No current facility-administered medications for this visit.    Past Medical History  Diagnosis Date  . CARCINOMA, BLADDER, TRANSITIONAL CELL 10/24/2007  . HYPERTENSION 01/07/2007  . HYPOTHYROIDISM 01/07/2007  . IBS 05/03/2008  . MIXED HEARING LOSS BILATERAL 09/28/2009  . OSTEOARTHRITIS, KNEE 04/25/2007  . UNSPECIFIED ARTHROPATHY MULTIPLE SITES 10/26/2009  . Aortic stenosis     Past Surgical History  Procedure Laterality Date  . Cataract extraction      bilateral  . Bunionectomy      bilateral  . Bladder tumor excision    . Tonsillectomy and adenoidectomy      x2  . Belpharoptosis repair      ROS: As stated in the HPI and negative for all other systems.  PHYSICAL EXAM BP 130/60 mmHg  Pulse 66  Ht 5\' 1"  (1.549 m)  Wt 162 lb 8 oz (73.71 kg)  BMI 30.72 kg/m2 GENERAL:  Well appearing for her age but starting to look slightly more frail NECK:  No jugular venous distention, waveform within normal limits, carotid upstroke brisk and symmetric, bilateral bruits, no thyromegaly LUNGS:  Clear to auscultation bilaterally CHEST:  Unremarkable HEART:  PMI not displaced or sustained,S1 and S2 within normal limits, no S3, no S4, no clicks, no rubs, apical systolic murmur mid to late peaking radiating out the outflow tract ABD:  Flat, positive bowel sounds normal in frequency in pitch, no bruits, no rebound, no guarding, no midline pulsatile mass, no hepatomegaly, no splenomegaly EXT:  2 plus pulses throughout, no edema, no cyanosis no clubbing  EKG:  Sinus rhythm, rate 66, axis within normal limits, intervals within normal limits, no acute ST-T wave changes.  03/01/2015   ASSESSMENT AND PLAN   Aortic stenosis - Given the fact that she has had  No symptoms, I think we can again  follow this clinically. At this point no change in therapy is indicated. She will let me know if she has any of the symptoms of presyncope, shortness of breath or chest pain.  We discussed this at length again today  HTN - The blood pressure is at target. No change in medications is indicated. We will continue with therapeutic lifestyle changes (TLC).  Her blood pressure is actually been low and she is doing better off the HCTZ

## 2015-03-01 NOTE — Patient Instructions (Signed)
Your physician wants you to follow-up in: 1 Year. You will receive a reminder letter in the mail two months in advance. If you don't receive a letter, please call our office to schedule the follow-up appointment.  

## 2015-03-07 ENCOUNTER — Other Ambulatory Visit: Payer: Self-pay | Admitting: Internal Medicine

## 2015-03-15 ENCOUNTER — Other Ambulatory Visit: Payer: Self-pay | Admitting: Gastroenterology

## 2015-03-25 ENCOUNTER — Encounter: Payer: Self-pay | Admitting: Internal Medicine

## 2015-03-25 ENCOUNTER — Ambulatory Visit (INDEPENDENT_AMBULATORY_CARE_PROVIDER_SITE_OTHER): Payer: Medicare Other | Admitting: Internal Medicine

## 2015-03-25 VITALS — BP 120/68 | HR 73 | Temp 98.0°F | Resp 18 | Ht 61.0 in | Wt 168.0 lb

## 2015-03-25 DIAGNOSIS — D649 Anemia, unspecified: Secondary | ICD-10-CM

## 2015-03-25 DIAGNOSIS — I350 Nonrheumatic aortic (valve) stenosis: Secondary | ICD-10-CM

## 2015-03-25 DIAGNOSIS — E039 Hypothyroidism, unspecified: Secondary | ICD-10-CM

## 2015-03-25 DIAGNOSIS — I10 Essential (primary) hypertension: Secondary | ICD-10-CM | POA: Diagnosis not present

## 2015-03-25 NOTE — Progress Notes (Signed)
Pre visit review using our clinic review tool, if applicable. No additional management support is needed unless otherwise documented below in the visit note. 

## 2015-03-25 NOTE — Patient Instructions (Signed)
Report any new or worsening symptoms Return as scheduled for general follow-up

## 2015-03-25 NOTE — Progress Notes (Signed)
Subjective:    Patient ID: Susan [name], female    DOB: 01/04/1919, 81 y.o.   MRN: [id number]  HPI 81 year old patient who has a history of aortic stenosis and essential hypertension.  Earlier this morning.  She had an episode of weakness and dizziness and felt poorly throughout the morning.  She had a light breakfast but then has felt normal.  Following lunch.  She did have an episode of diarrhea.  She has a history of IBS and is on Questran.  She also uses Lomotil when necessary.  She has had similar spells of long duration.  Often goes 2 months between episodes but they have occasionally occur more frequently.  About one month ago.  Diuretic therapy was discontinued  Past Medical History  Diagnosis Date  . CARCINOMA, BLADDER, TRANSITIONAL CELL 10/24/2007  . HYPERTENSION 01/07/2007  . HYPOTHYROIDISM 01/07/2007  . IBS 05/03/2008  . MIXED HEARING LOSS BILATERAL 09/28/2009  . OSTEOARTHRITIS, KNEE 04/25/2007  . UNSPECIFIED ARTHROPATHY MULTIPLE SITES 10/26/2009  . Aortic stenosis     Social History   Social History  . Marital Status: Widowed    Spouse Name: N/A  . Number of Children: 3  . Years of Education: N/A   Occupational History  . retired    Social History Main Topics  . Smoking status: Former Smoker -- 0.50 packs/day for 20 years    Types: Cigarettes    Quit date: 09/05/1973  . Smokeless tobacco: Never Used  . Alcohol Use: Yes     Comment: ocass  . Drug Use: No  . Sexual Activity: Not on file   Other Topics Concern  . Not on file   Social History Narrative    Past Surgical History  Procedure Laterality Date  . Cataract extraction      bilateral  . Bunionectomy      bilateral  . Bladder tumor excision    . Tonsillectomy and adenoidectomy      x2  . Belpharoptosis repair      Family History  Problem Relation Age of Onset  . Stomach cancer Mother   . Arthritis Mother   . Leukemia Father   . Kidney failure Father   . Anuerysm Brother     brain  . ALS  Sister   . Stomach cancer Maternal Aunt   . Esophageal cancer Neg Hx   . Rectal cancer Neg Hx   . Colon cancer Daughter     Allergies  Allergen Reactions  . Ace Inhibitors     REACTION: cough  . Hydrocodone     REACTION: Hallucinations---minor    Current Outpatient Prescriptions on File Prior to Visit  Medication Sig Dispense Refill  . acetaminophen (TYLENOL) 500 MG tablet Take 1,000 mg by mouth Bacallao. And 1 at bedtime     . aspirin 81 MG tablet Take 1 tablet (81 mg total) by mouth Perras. 30 tablet   . atorvastatin (LIPITOR) 20 MG tablet Take 1 tablet (20 mg total) by mouth Korber. 90 tablet 2  . calcium carbonate (TUMS) 500 MG chewable tablet Chew 1 tablet by mouth as needed.      . carvedilol (COREG) 12.5 MG tablet TAKE  (1)  TABLET TWICE A DAY WITH MEALS (BREAKFAST AND SUPPER) 180 tablet 3  . cholestyramine (QUESTRAN) 4 G packet TAKE ONE PACKET Grealish. 30 each 11  . diphenoxylate-atropine (LOMOTIL) 2.5-0.025 MG per tablet TAKE 1 OR 2 TABLETS Kitchings AS NEEDED FOR DIARRHEA OR LOOSE STOOLS. 60 tablet 0  .  famotidine (PEPCID) 20 MG tablet TAKE 1 TABLET TWICE Olejniczak. 60 tablet 0  . glucosamine-chondroitin 500-400 MG tablet Take 1 tablet by mouth 2 (two) times Noblet.     . hydrocortisone (ANUSOL-HC) 25 MG suppository Place 1 suppository (25 mg total) rectally 2 (two) times Kinkaid. 24 suppository 1  . hydrocortisone (PROCTOSOL HC) 2.5 % rectal cream Use the cream with applicator, insert small amount into the rectal area 2-3 times Gandolfi. 30 g 1  . levothyroxine (SYNTHROID, LEVOTHROID) 75 MCG tablet TAKE 1 TABLET EACH DAY. 90 tablet 1  . loperamide (IMODIUM A-D) 2 MG tablet Take 4 mg by mouth as needed.     . Multiple Vitamin (MULTIVITAMIN) tablet Take 1 tablet by mouth Lint.      . Ranitidine HCl (ZANTAC 75 PO) Take by mouth as directed.     No current facility-administered medications on file prior to visit.    BP 120/68 mmHg  Pulse 73  Temp(Src) 98 F (36.7 C) (Oral)  Resp 18  Ht 5'  1" (1.549 m)  Wt 168 lb (76.204 kg)  BMI 31.76 kg/m2  SpO2 97%      Review of Systems  HENT: Negative for congestion, dental problem, hearing loss, rhinorrhea, sinus pressure, sore throat and tinnitus.   Eyes: Negative for pain, discharge and visual disturbance.  Respiratory: Negative for cough and shortness of breath.   Cardiovascular: Negative for chest pain, palpitations and leg swelling.  Gastrointestinal: Positive for diarrhea. Negative for nausea, vomiting, abdominal pain, constipation, blood in stool and abdominal distention.  Genitourinary: Negative for dysuria, urgency, frequency, hematuria, flank pain, vaginal bleeding, vaginal discharge, difficulty urinating, vaginal pain and pelvic pain.  Musculoskeletal: Negative for joint swelling, arthralgias and gait problem.  Skin: Negative for rash.  Neurological: Positive for weakness. Negative for dizziness, syncope, speech difficulty, numbness and headaches.  Hematological: Negative for adenopathy.  Psychiatric/Behavioral: Negative for behavioral problems, dysphoric mood and agitation. The patient is not nervous/anxious.        Objective:   Physical Exam  Constitutional: She is oriented to person, place, and time. She appears well-developed and well-nourished.  HENT:  Head: Normocephalic.  Right Ear: External ear normal.  Left Ear: External ear normal.  Mouth/Throat: Oropharynx is clear and moist.  Eyes: Conjunctivae and EOM are normal. Pupils are equal, round, and reactive to light.  Neck: Normal range of motion. Neck supple. No thyromegaly present.  Cardiovascular: Normal rate, regular rhythm and intact distal pulses.   Murmur heard. Pulmonary/Chest: Effort normal and breath sounds normal.  Abdominal: Soft. Bowel sounds are normal. She exhibits no mass. There is no tenderness.  Musculoskeletal: Normal range of motion. She exhibits no edema.  Lymphadenopathy:    She has no cervical adenopathy.  Neurological: She is alert  and oriented to person, place, and time.  Skin: Skin is warm and dry. No rash noted.  Psychiatric: She has a normal mood and affect. Her behavior is normal.          Assessment & Plan:   Episode of weakness in the setting of diarrhea.  History of IBS.  These episodes have occurred chronically but infrequent Essential hypertension, stable off diuretic therapy History of cerebrovascular disease  No change in therapy We'll observe at this time.  The patient report any new or worsening symptoms Laboratory update from one month ago reviewed

## 2015-03-28 DIAGNOSIS — M1711 Unilateral primary osteoarthritis, right knee: Secondary | ICD-10-CM | POA: Diagnosis not present

## 2015-04-06 DIAGNOSIS — M1711 Unilateral primary osteoarthritis, right knee: Secondary | ICD-10-CM | POA: Diagnosis not present

## 2015-04-15 DIAGNOSIS — M1711 Unilateral primary osteoarthritis, right knee: Secondary | ICD-10-CM | POA: Diagnosis not present

## 2015-04-16 ENCOUNTER — Other Ambulatory Visit: Payer: Self-pay | Admitting: Gastroenterology

## 2015-04-19 ENCOUNTER — Encounter: Payer: Self-pay | Admitting: Internal Medicine

## 2015-04-19 ENCOUNTER — Ambulatory Visit (INDEPENDENT_AMBULATORY_CARE_PROVIDER_SITE_OTHER): Payer: Medicare Other | Admitting: Internal Medicine

## 2015-04-19 VITALS — BP 140/80 | HR 62 | Temp 98.0°F | Resp 18 | Ht 61.0 in | Wt 164.0 lb

## 2015-04-19 DIAGNOSIS — R232 Flushing: Secondary | ICD-10-CM | POA: Diagnosis not present

## 2015-04-19 DIAGNOSIS — I10 Essential (primary) hypertension: Secondary | ICD-10-CM | POA: Diagnosis not present

## 2015-04-19 DIAGNOSIS — I350 Nonrheumatic aortic (valve) stenosis: Secondary | ICD-10-CM | POA: Diagnosis not present

## 2015-04-19 DIAGNOSIS — E039 Hypothyroidism, unspecified: Secondary | ICD-10-CM

## 2015-04-19 NOTE — Progress Notes (Signed)
Pre visit review using our clinic review tool, if applicable. No additional management support is needed unless otherwise documented below in the visit note. 

## 2015-04-19 NOTE — Patient Instructions (Signed)
Report any new or worsening symptoms  Return as scheduled for your routine follow-up

## 2015-04-19 NOTE — Progress Notes (Signed)
Subjective:    Patient ID: Susan [name], female    DOB: 08-29-1918, 81 y.o.   MRN: [id number]  HPI  81 year old patient who has a history of aortic stenosis essential hypertension.  2 days ago.  She expressed an episode where she became quite warm and flushed.  She felt flushed at the level the waist to the head.  This occurred after her evening meal.  For approximately 24 hours, she felt slightly weak, but today she feels well.  No prior similar episodes.  She did have a knee injection Friday, but no new medications.  Past Medical History  Diagnosis Date  . CARCINOMA, BLADDER, TRANSITIONAL CELL 10/24/2007  . HYPERTENSION 01/07/2007  . HYPOTHYROIDISM 01/07/2007  . IBS 05/03/2008  . MIXED HEARING LOSS BILATERAL 09/28/2009  . OSTEOARTHRITIS, KNEE 04/25/2007  . UNSPECIFIED ARTHROPATHY MULTIPLE SITES 10/26/2009  . Aortic stenosis     Social History   Social History  . Marital Status: Widowed    Spouse Name: N/A  . Number of Children: 3  . Years of Education: N/A   Occupational History  . retired    Social History Main Topics  . Smoking status: Former Smoker -- 0.50 packs/day for 20 years    Types: Cigarettes    Quit date: 09/05/1973  . Smokeless tobacco: Never Used  . Alcohol Use: Yes     Comment: ocass  . Drug Use: No  . Sexual Activity: Not on file   Other Topics Concern  . Not on file   Social History Narrative    Past Surgical History  Procedure Laterality Date  . Cataract extraction      bilateral  . Bunionectomy      bilateral  . Bladder tumor excision    . Tonsillectomy and adenoidectomy      x2  . Belpharoptosis repair      Family History  Problem Relation Age of Onset  . Stomach cancer Mother   . Arthritis Mother   . Leukemia Father   . Kidney failure Father   . Anuerysm Brother     brain  . ALS Sister   . Stomach cancer Maternal Aunt   . Esophageal cancer Neg Hx   . Rectal cancer Neg Hx   . Colon cancer Daughter     Allergies  Allergen  Reactions  . Ace Inhibitors     REACTION: cough  . Hydrocodone     REACTION: Hallucinations---minor    Current Outpatient Prescriptions on File Prior to Visit  Medication Sig Dispense Refill  . acetaminophen (TYLENOL) 500 MG tablet Take 1,000 mg by mouth Pettinato. And 1 at bedtime     . aspirin 81 MG tablet Take 1 tablet (81 mg total) by mouth Balla. 30 tablet   . atorvastatin (LIPITOR) 20 MG tablet Take 1 tablet (20 mg total) by mouth Martinec. 90 tablet 2  . calcium carbonate (TUMS) 500 MG chewable tablet Chew 1 tablet by mouth as needed.      . carvedilol (COREG) 12.5 MG tablet TAKE  (1)  TABLET TWICE A DAY WITH MEALS (BREAKFAST AND SUPPER) 180 tablet 3  . cholestyramine (QUESTRAN) 4 G packet TAKE ONE PACKET Feimster. 30 each 11  . diphenoxylate-atropine (LOMOTIL) 2.5-0.025 MG per tablet TAKE 1 OR 2 TABLETS Weinhold AS NEEDED FOR DIARRHEA OR LOOSE STOOLS. 60 tablet 0  . famotidine (PEPCID) 20 MG tablet TAKE 1 TABLET TWICE Burrous. 60 tablet 0  . glucosamine-chondroitin 500-400 MG tablet Take 1 tablet by mouth 2 (  two) times Leider.     . hydrocortisone (ANUSOL-HC) 25 MG suppository Place 1 suppository (25 mg total) rectally 2 (two) times Suminski. 24 suppository 1  . hydrocortisone (PROCTOSOL HC) 2.5 % rectal cream Use the cream with applicator, insert small amount into the rectal area 2-3 times Clendenning. 30 g 1  . levothyroxine (SYNTHROID, LEVOTHROID) 75 MCG tablet TAKE 1 TABLET EACH DAY. 90 tablet 1  . loperamide (IMODIUM A-D) 2 MG tablet Take 4 mg by mouth as needed.     . Multiple Vitamin (MULTIVITAMIN) tablet Take 1 tablet by mouth Bleiler.      . Ranitidine HCl (ZANTAC 75 PO) Take by mouth as directed.     No current facility-administered medications on file prior to visit.    BP 140/80 mmHg  Pulse 62  Temp(Src) 98 F (36.7 C) (Oral)  Resp 18  Ht 5\' 1"  (1.549 m)  Wt 164 lb (74.39 kg)  BMI 31.00 kg/m2  SpO2 97%     Review of Systems  HENT: Negative for congestion, dental problem, hearing  loss, rhinorrhea, sinus pressure, sore throat and tinnitus.   Eyes: Negative for pain, discharge and visual disturbance.  Respiratory: Negative for cough and shortness of breath.   Cardiovascular: Negative for chest pain, palpitations and leg swelling.  Gastrointestinal: Negative for nausea, vomiting, abdominal pain, diarrhea, constipation, blood in stool and abdominal distention.  Genitourinary: Negative for dysuria, urgency, frequency, hematuria, flank pain, vaginal bleeding, vaginal discharge, difficulty urinating, vaginal pain and pelvic pain.  Musculoskeletal: Negative for joint swelling, arthralgias and gait problem.  Skin: Negative for rash.  Neurological: Positive for weakness. Negative for dizziness, syncope, speech difficulty, numbness and headaches.  Hematological: Negative for adenopathy.  Psychiatric/Behavioral: Negative for behavioral problems, dysphoric mood and agitation. The patient is not nervous/anxious.        Objective:   Physical Exam  Constitutional: She is oriented to person, place, and time. She appears well-developed and well-nourished.  HENT:  Head: Normocephalic.  Right Ear: External ear normal.  Left Ear: External ear normal.  Mouth/Throat: Oropharynx is clear and moist.  Eyes: Conjunctivae and EOM are normal. Pupils are equal, round, and reactive to light.  Neck: Normal range of motion. Neck supple. No thyromegaly present.  Cardiovascular: Normal rate, regular rhythm and intact distal pulses.   Murmur heard. Pulmonary/Chest: Effort normal and breath sounds normal.  Abdominal: Soft. Bowel sounds are normal. She exhibits no mass. There is no tenderness.  Musculoskeletal: Normal range of motion.  Lymphadenopathy:    She has no cervical adenopathy.  Neurological: She is alert and oriented to person, place, and time.  Skin: Skin is warm and dry. No rash noted.  Psychiatric: She has a normal mood and affect. Her behavior is normal.          Assessment &  Plan:   Aortic stenosis Essential hypertension, stable Flushing episode 2 days ago.  Unclear etiology.  Will observe.  Recent laboratory studies reviewed Cerebrovascular disease with history of prior sentry lacunar stroke.  Return as scheduled for follow-up.  She report any new or worsening symptoms

## 2015-04-30 ENCOUNTER — Telehealth: Payer: Self-pay | Admitting: Physician Assistant

## 2015-05-01 NOTE — Telephone Encounter (Signed)
See phone note 10/22

## 2015-05-09 ENCOUNTER — Other Ambulatory Visit: Payer: Self-pay | Admitting: Internal Medicine

## 2015-05-10 ENCOUNTER — Telehealth: Payer: Self-pay | Admitting: Internal Medicine

## 2015-05-10 DIAGNOSIS — H9190 Unspecified hearing loss, unspecified ear: Secondary | ICD-10-CM

## 2015-05-10 NOTE — Telephone Encounter (Signed)
Pt needs a letter send to [facility] that its ok to having hearing test and that dr Burnice Logan is her pcp. Pt has bcbs and medicare. Fax [phone] phone # [phone]

## 2015-05-12 NOTE — Telephone Encounter (Signed)
Called [facility] and spoke to [facility], asked her if pt needs referral or letter. Kylie said pt needs referral due to Medicare. Told her okay will send referral. Kylie verbalized understanding. Order for referral done.

## 2015-05-17 ENCOUNTER — Other Ambulatory Visit: Payer: Self-pay | Admitting: Gastroenterology

## 2015-05-26 DIAGNOSIS — M1711 Unilateral primary osteoarthritis, right knee: Secondary | ICD-10-CM | POA: Diagnosis not present

## 2015-06-11 ENCOUNTER — Other Ambulatory Visit: Payer: Self-pay | Admitting: Gastroenterology

## 2015-06-13 NOTE — Telephone Encounter (Signed)
Pt requesting a refill on Pepcid BID.last office visit 06-2014. Please advise.

## 2015-06-16 DIAGNOSIS — Z85828 Personal history of other malignant neoplasm of skin: Secondary | ICD-10-CM | POA: Diagnosis not present

## 2015-06-16 DIAGNOSIS — D692 Other nonthrombocytopenic purpura: Secondary | ICD-10-CM | POA: Diagnosis not present

## 2015-06-16 DIAGNOSIS — L820 Inflamed seborrheic keratosis: Secondary | ICD-10-CM | POA: Diagnosis not present

## 2015-06-16 DIAGNOSIS — L821 Other seborrheic keratosis: Secondary | ICD-10-CM | POA: Diagnosis not present

## 2015-07-16 ENCOUNTER — Other Ambulatory Visit: Payer: Self-pay | Admitting: Gastroenterology

## 2015-08-03 ENCOUNTER — Ambulatory Visit (INDEPENDENT_AMBULATORY_CARE_PROVIDER_SITE_OTHER): Payer: Medicare Other | Admitting: Internal Medicine

## 2015-08-03 ENCOUNTER — Encounter: Payer: Self-pay | Admitting: Internal Medicine

## 2015-08-03 VITALS — BP 138/80 | HR 71 | Temp 98.0°F | Resp 20 | Ht 61.0 in | Wt 165.0 lb

## 2015-08-03 DIAGNOSIS — I10 Essential (primary) hypertension: Secondary | ICD-10-CM

## 2015-08-03 DIAGNOSIS — K055 Other periodontal diseases: Secondary | ICD-10-CM | POA: Diagnosis not present

## 2015-08-03 DIAGNOSIS — E039 Hypothyroidism, unspecified: Secondary | ICD-10-CM | POA: Diagnosis not present

## 2015-08-03 DIAGNOSIS — K068 Other specified disorders of gingiva and edentulous alveolar ridge: Secondary | ICD-10-CM

## 2015-08-03 MED ORDER — PENICILLIN V POTASSIUM 500 MG PO TABS: 500.0000 mg | ORAL_TABLET | Freq: Three times a day (TID) | ORAL | Status: DC

## 2015-08-03 NOTE — Patient Instructions (Addendum)
Hold aspirin  Take your antibiotic as prescribed until ALL of it is gone, but stop if you develop a rash, swelling, or any side effects of the medication.  Contact our office as soon as possible if  there are side effects of the medication.  Continue mouth rinse and gentle gargling   HOME CARE INSTRUCTIONS  Brush your teeth twice a day and floss at least once per day. When flossing, it is best to floss first then brush.  Limit sugar between meals and maintain a well-balanced diet.  Even the best dental hygiene will not prevent plaque from developing. It is necessary for you to see your dentist on a regular basis for cleaning and regular checkups.  Your dentist can recommend proper oral hygiene and mouth care and suggest special toothpastes or mouth rinses.   SEEK DENTAL OR MEDICAL CARE IF:  You have painful, reddened tissue around your teeth, or you have puffy swollen gums.  You have difficulty chewing.  You notice any loose or infected teeth.  You have swollen glands.  Your gums bleed easily when you brush your teeth or are very tender to the touch.

## 2015-08-03 NOTE — Progress Notes (Signed)
Pre visit review using our clinic review tool, if applicable. No additional management support is needed unless otherwise documented below in the visit note. 

## 2015-08-03 NOTE — Progress Notes (Signed)
Subjective:    Patient ID: Susan [name], female    DOB: 10/12/1918, 82 y.o.   MRN: [id number]  HPI   82 year old patient who has a history of essential hypertension and hypothyroidism.  She has been on low-dose [place]ripp[place] aspirin.  For the past 6 weeks she has been followed by her general dentist closely due to persistent bleeding from around her left upper molar region.  Earlier today when she blew her nose.  She noted a small amount of blood from the left nares  Past Medical History  Diagnosis Date  . CARCINOMA, BLADDER, TRANSITIONAL CELL 10/24/2007  . HYPERTENSION 01/07/2007  . HYPOTHYROIDISM 01/07/2007  . IBS 05/03/2008  . MIXED HEARING LOSS BILATERAL 09/28/2009  . OSTEOARTHRITIS, KNEE 04/25/2007  . UNSPECIFIED ARTHROPATHY MULTIPLE SITES 10/26/2009  . Aortic stenosis     Social History   Social History  . Marital Status: Widowed    Spouse Name: N/A  . Number of Children: 3  . Years of Education: N/A   Occupational History  . retired    Social History Main Topics  . Smoking status: Former Smoker -- 0.50 packs/day for 20 years    Types: Cigarettes    Quit date: 09/05/1973  . Smokeless tobacco: Never Used  . Alcohol Use: Yes     Comment: ocass  . Drug Use: No  . Sexual Activity: Not on file   Other Topics Concern  . Not on file   Social History Narrative    Past Surgical History  Procedure Laterality Date  . Cataract extraction      bilateral  . Bunionectomy      bilateral  . Bladder tumor excision    . Tonsillectomy and adenoidectomy      x2  . Belpharoptosis repair      Family History  Problem Relation Age of Onset  . Stomach cancer Mother   . Arthritis Mother   . Leukemia Father   . Kidney failure Father   . Anuerysm Brother     brain  . ALS Sister   . Stomach cancer Maternal Aunt   . Esophageal cancer Neg Hx   . Rectal cancer Neg Hx   . Colon cancer Daughter     Allergies  Allergen Reactions  . Ace Inhibitors     REACTION: cough  .  Hydrocodone     REACTION: Hallucinations---minor    Current Outpatient Prescriptions on File Prior to Visit  Medication Sig Dispense Refill  . acetaminophen (TYLENOL) 500 MG tablet Take 1,000 mg by mouth Braunschweig. And 1 at bedtime     . aspirin 81 MG tablet Take 1 tablet (81 mg total) by mouth Yuan. 30 tablet   . atorvastatin (LIPITOR) 20 MG tablet TAKE 1 TABLET ONCE Rubens. 90 tablet 1  . calcium carbonate (TUMS) 500 MG chewable tablet Chew 1 tablet by mouth as needed.      . carvedilol (COREG) 12.5 MG tablet TAKE  (1)  TABLET TWICE A DAY WITH MEALS (BREAKFAST AND SUPPER) 180 tablet 3  . cholestyramine (QUESTRAN) 4 G packet TAKE ONE PACKET Slatter. 30 each 11  . diphenoxylate-atropine (LOMOTIL) 2.5-0.025 MG per tablet TAKE 1 OR 2 TABLETS Baray AS NEEDED FOR DIARRHEA OR LOOSE STOOLS. 60 tablet 0  . famotidine (PEPCID) 20 MG tablet TAKE 1 TABLET TWICE Willers. 60 tablet 6  . glucosamine-chondroitin 500-400 MG tablet Take 1 tablet by mouth 2 (two) times Dutan.     . hydrocortisone (ANUSOL-HC) 25 MG suppository Place 1  suppository (25 mg total) rectally 2 (two) times Placencia. 24 suppository 1  . hydrocortisone (PROCTOSOL HC) 2.5 % rectal cream Use the cream with applicator, insert small amount into the rectal area 2-3 times Banik. 30 g 1  . levothyroxine (SYNTHROID, LEVOTHROID) 75 MCG tablet TAKE 1 TABLET EACH DAY. 90 tablet 1  . loperamide (IMODIUM A-D) 2 MG tablet Take 4 mg by mouth as needed.     . Multiple Vitamin (MULTIVITAMIN) tablet Take 1 tablet by mouth Pizano.       No current facility-administered medications on file prior to visit.    BP 138/80 mmHg  Pulse 71  Temp(Src) 98 F (36.7 C) (Oral)  Resp 20  Ht 5\' 1"  (1.549 m)  Wt 165 lb (74.844 kg)  BMI 31.19 kg/m2  SpO2 96%     Review of Systems  Constitutional: Negative.   HENT: Positive for nosebleeds. Negative for congestion, dental problem, hearing loss, rhinorrhea, sinus pressure, sore throat and tinnitus.   Eyes: Negative for  pain, discharge and visual disturbance.  Respiratory: Negative for cough and shortness of breath.   Cardiovascular: Negative for chest pain, palpitations and leg swelling.  Gastrointestinal: Negative for nausea, vomiting, abdominal pain, diarrhea, constipation, blood in stool and abdominal distention.  Genitourinary: Negative for dysuria, urgency, frequency, hematuria, flank pain, vaginal bleeding, vaginal discharge, difficulty urinating, vaginal pain and pelvic pain.  Musculoskeletal: Negative for joint swelling, arthralgias and gait problem.  Skin: Negative for rash.  Neurological: Negative for dizziness, syncope, speech difficulty, weakness, numbness and headaches.  Hematological: Negative for adenopathy.  Psychiatric/Behavioral: Negative for behavioral problems, dysphoric mood and agitation. The patient is not nervous/anxious.        Objective:   Physical Exam  HENT:  Left nares was examined.  No abnormalities or blood  Area of erythema posterior to the left upper molar Area was palpated and no mass effect No localized tenderness          Assessment & Plan:    Gingival bleeding.  Will continue mouth rinse.  And gentle, gargling.  Will hold aspirin for 4 weeks.  Will treat with Pen-Vee K for 7 days for possible gingival infection Follow-up general dentistry  Follow-up here 4 weeks.  Will consider referral to oral surgeon, possibly at that time Essential hypertension, stable

## 2015-08-08 ENCOUNTER — Telehealth: Payer: Self-pay | Admitting: Internal Medicine

## 2015-08-08 NOTE — Telephone Encounter (Signed)
Spoke to pt, told her to stop the penicillin per Dr.K. Pt verbalized understanding.

## 2015-08-08 NOTE — Telephone Encounter (Signed)
Discontinue penicillin.  Please notify patient

## 2015-08-08 NOTE — Telephone Encounter (Signed)
Please see message and advise 

## 2015-08-08 NOTE — Telephone Encounter (Signed)
[facility] [place] [facility] - Client [place] [facility]  Patient Name: Susan [name]  DOB: [birth year]    Initial Comment Caller states c/o diarrhea, is on Penicillin   Nurse Assessment  Nurse: Wynetta Emery RN, Baker Janus Date/Time (Eastern Time): 08/08/2015 9:49:59 AM  Confirm and document reason for call. If symptomatic, describe symptoms. You must click the next button to save text entered. ---Joaquim Lai came in to see Dr. Raliegh Ip and given PCN VK 500mg  -- taken two doses of it so far and causing diarrhea for two days --  Has the patient traveled out of the country within the last 30 days? ---No  Does the patient have any new or worsening symptoms? ---Yes  Will a triage be completed? ---Yes  Related visit to physician within the last 2 weeks? ---Yes  Does the PT have any chronic conditions? (i.e. diabetes, asthma, etc.) ---Unknown  Is this a behavioral health or substance abuse call? ---No     Guidelines    Guideline Title Affirmed Question Affirmed Notes  Diarrhea [1] MODERATE diarrhea (e.g., 4-6 times / day more than normal) AND [2] age > 60 years    Final Disposition User   See Physician within 24 Hours [address], RN, Baker Janus    Comments  NOTE; appt made with DR Simonne Martinet for 08/09/2015 400pm for medication reaction --diarrhea and weakness -- advised to stop med if MD does not want her to please call her back and tell to restart it.   Referrals  REFERRED TO PCP OFFICE   Disagree/Comply: Comply

## 2015-08-09 ENCOUNTER — Ambulatory Visit (INDEPENDENT_AMBULATORY_CARE_PROVIDER_SITE_OTHER): Payer: Medicare Other | Admitting: Internal Medicine

## 2015-08-09 ENCOUNTER — Encounter: Payer: Self-pay | Admitting: Internal Medicine

## 2015-08-09 VITALS — BP 140/66 | HR 70 | Temp 98.0°F | Resp 20 | Ht 61.0 in | Wt 165.0 lb

## 2015-08-09 DIAGNOSIS — I10 Essential (primary) hypertension: Secondary | ICD-10-CM | POA: Diagnosis not present

## 2015-08-09 NOTE — Progress Notes (Signed)
Subjective:    Patient ID: Susan [name], female    DOB: 01-10-1919, 82 y.o.   MRN: [id number]  HPI  82 year old patient who was seen recently for gingival bleeding.  She had been seen by her dentist as well and seen to be bleeding from an area of irritation and soreness behind her left upper last molar. She was placed on penicillin for possible gingivitis, but this was discontinued after the onset of diarrhea.  She has had no further tenderness or bleeding for the past day or so.  Diarrhea has also resolved.  She generally feels well today  Past Medical History  Diagnosis Date  . CARCINOMA, BLADDER, TRANSITIONAL CELL 10/24/2007  . HYPERTENSION 01/07/2007  . HYPOTHYROIDISM 01/07/2007  . IBS 05/03/2008  . MIXED HEARING LOSS BILATERAL 09/28/2009  . OSTEOARTHRITIS, KNEE 04/25/2007  . UNSPECIFIED ARTHROPATHY MULTIPLE SITES 10/26/2009  . Aortic stenosis     Social History   Social History  . Marital Status: Widowed    Spouse Name: N/A  . Number of Children: 3  . Years of Education: N/A   Occupational History  . retired    Social History Main Topics  . Smoking status: Former Smoker -- 0.50 packs/day for 20 years    Types: Cigarettes    Quit date: 09/05/1973  . Smokeless tobacco: Never Used  . Alcohol Use: Yes     Comment: ocass  . Drug Use: No  . Sexual Activity: Not on file   Other Topics Concern  . Not on file   Social History Narrative    Past Surgical History  Procedure Laterality Date  . Cataract extraction      bilateral  . Bunionectomy      bilateral  . Bladder tumor excision    . Tonsillectomy and adenoidectomy      x2  . Belpharoptosis repair      Family History  Problem Relation Age of Onset  . Stomach cancer Mother   . Arthritis Mother   . Leukemia Father   . Kidney failure Father   . Anuerysm Brother     brain  . ALS Sister   . Stomach cancer Maternal Aunt   . Esophageal cancer Neg Hx   . Rectal cancer Neg Hx   . Colon cancer Daughter      Allergies  Allergen Reactions  . Penicillins Diarrhea  . Ace Inhibitors     REACTION: cough  . Hydrocodone     REACTION: Hallucinations---minor    Current Outpatient Prescriptions on File Prior to Visit  Medication Sig Dispense Refill  . acetaminophen (TYLENOL) 500 MG tablet Take 1,000 mg by mouth Dubuc. And 1 at bedtime     . aspirin 81 MG tablet Take 1 tablet (81 mg total) by mouth Woodin. 30 tablet   . atorvastatin (LIPITOR) 20 MG tablet TAKE 1 TABLET ONCE Dudash. 90 tablet 1  . calcium carbonate (TUMS) 500 MG chewable tablet Chew 1 tablet by mouth as needed.      . carvedilol (COREG) 12.5 MG tablet TAKE  (1)  TABLET TWICE A DAY WITH MEALS (BREAKFAST AND SUPPER) 180 tablet 3  . cholestyramine (QUESTRAN) 4 G packet TAKE ONE PACKET Backlund. 30 each 11  . diphenoxylate-atropine (LOMOTIL) 2.5-0.025 MG per tablet TAKE 1 OR 2 TABLETS Saunders AS NEEDED FOR DIARRHEA OR LOOSE STOOLS. 60 tablet 0  . famotidine (PEPCID) 20 MG tablet TAKE 1 TABLET TWICE Lish. 60 tablet 6  . glucosamine-chondroitin 500-400 MG tablet Take 1 tablet  by mouth 2 (two) times Mcelreath.     . hydrocortisone (ANUSOL-HC) 25 MG suppository Place 1 suppository (25 mg total) rectally 2 (two) times Gutierrez. 24 suppository 1  . hydrocortisone (PROCTOSOL HC) 2.5 % rectal cream Use the cream with applicator, insert small amount into the rectal area 2-3 times Deangelo. 30 g 1  . levothyroxine (SYNTHROID, LEVOTHROID) 75 MCG tablet TAKE 1 TABLET EACH DAY. 90 tablet 1  . loperamide (IMODIUM A-D) 2 MG tablet Take 4 mg by mouth as needed.     . Multiple Vitamin (MULTIVITAMIN) tablet Take 1 tablet by mouth Allan.       No current facility-administered medications on file prior to visit.    BP 140/66 mmHg  Pulse 70  Temp(Src) 98 F (36.7 C) (Oral)  Resp 20  Ht 5\' 1"  (1.549 m)  Wt 165 lb (74.844 kg)  BMI 31.19 kg/m2  SpO2 97%     Review of Systems  Gastrointestinal: Positive for diarrhea.       Objective:   Physical Exam   Constitutional: She appears well-developed and well-nourished. No distress.  Wheelchair bound.  Blood pressure normal  HENT:  The gingival area behind the left upper molar now appeared normal.  There is no erythema          Assessment & Plan:   Gingival bleeding, resolved.  The patient will hold aspirin for an additional week and then will resume Hypertension, stable  Recheck 6 months or as needed

## 2015-08-09 NOTE — Patient Instructions (Signed)
Limit your sodium (Salt) intake  Return in 6 months for follow-up  

## 2015-08-09 NOTE — Progress Notes (Signed)
Pre visit review using our clinic review tool, if applicable. No additional management support is needed unless otherwise documented below in the visit note. 

## 2015-08-10 DIAGNOSIS — Z23 Encounter for immunization: Secondary | ICD-10-CM | POA: Diagnosis not present

## 2015-08-31 ENCOUNTER — Ambulatory Visit: Payer: Medicare Other | Admitting: Internal Medicine

## 2015-10-31 ENCOUNTER — Other Ambulatory Visit: Payer: Self-pay | Admitting: Internal Medicine

## 2015-11-19 ENCOUNTER — Other Ambulatory Visit: Payer: Self-pay | Admitting: Internal Medicine

## 2015-12-21 DIAGNOSIS — H04123 Dry eye syndrome of bilateral lacrimal glands: Secondary | ICD-10-CM | POA: Diagnosis not present

## 2015-12-21 DIAGNOSIS — H5022 Vertical strabismus, left eye: Secondary | ICD-10-CM | POA: Diagnosis not present

## 2015-12-21 DIAGNOSIS — Z961 Presence of intraocular lens: Secondary | ICD-10-CM | POA: Diagnosis not present

## 2015-12-21 DIAGNOSIS — H353131 Nonexudative age-related macular degeneration, bilateral, early dry stage: Secondary | ICD-10-CM | POA: Diagnosis not present

## 2015-12-23 ENCOUNTER — Ambulatory Visit: Payer: Medicare Other | Admitting: Gastroenterology

## 2015-12-27 ENCOUNTER — Encounter: Payer: Self-pay | Admitting: Gastroenterology

## 2015-12-27 ENCOUNTER — Ambulatory Visit (INDEPENDENT_AMBULATORY_CARE_PROVIDER_SITE_OTHER): Payer: Medicare Other | Admitting: Gastroenterology

## 2015-12-27 VITALS — BP 122/80 | HR 60

## 2015-12-27 DIAGNOSIS — K591 Functional diarrhea: Secondary | ICD-10-CM

## 2015-12-27 DIAGNOSIS — K5900 Constipation, unspecified: Secondary | ICD-10-CM | POA: Diagnosis not present

## 2015-12-27 NOTE — Progress Notes (Signed)
Review of pertinent gastrointestinal problems:  1. Colonic adenomas. Colonoscopy 2006 by Dr. Leonie Douglas removed 3 subcentimeter adenomas. Usual followup would be at 3 year interval however given her age we decided not to repeat colonoscopy as yet. She is also very uninterested in repeat colonoscopy. 11/2011 colonoscopy mulitple small TAs all removed, TI normal, normal mucosa biopsied, no microsopic colitis, added questran with very good response  2. Mild, chronic diarrhea: 2011, 2012. Imodium, Lomotil both help, cholestyramine every 2-3 days helps  HPI: This is a  very pleasant 82 year old woman whom I last saw 6 or 7 months ago  Chief complaint is alternating constipation diarrhea  She has questions.    She alternates between constipation and diarrhea;  She brought in adds from newspaper.  She tells me she intermittently takes Imodium, and intermittently takes stool softeners as well   Past Medical History  Diagnosis Date  . CARCINOMA, BLADDER, TRANSITIONAL CELL 10/24/2007  . HYPERTENSION 01/07/2007  . HYPOTHYROIDISM 01/07/2007  . IBS 05/03/2008  . MIXED HEARING LOSS BILATERAL 09/28/2009  . OSTEOARTHRITIS, KNEE 04/25/2007  . UNSPECIFIED ARTHROPATHY MULTIPLE SITES 10/26/2009  . Aortic stenosis     Past Surgical History  Procedure Laterality Date  . Cataract extraction      bilateral  . Bunionectomy      bilateral  . Bladder tumor excision    . Tonsillectomy and adenoidectomy      x2  . Belpharoptosis repair      Current Outpatient Prescriptions  Medication Sig Dispense Refill  . acetaminophen (TYLENOL) 500 MG tablet Take 1,000 mg by mouth Gipe. And 1 at bedtime     . aspirin 81 MG tablet Take 1 tablet (81 mg total) by mouth Keys. 30 tablet   . atorvastatin (LIPITOR) 20 MG tablet TAKE 1 TABLET ONCE Heart. 90 tablet 1  . calcium carbonate (TUMS) 500 MG chewable tablet Chew 1 tablet by mouth as needed.      . carvedilol (COREG) 12.5 MG tablet TAKE  (1)  TABLET TWICE A DAY WITH  MEALS (BREAKFAST AND SUPPER) 180 tablet 3  . cholestyramine (QUESTRAN) 4 G packet TAKE ONE PACKET Colleran. (Patient taking differently: TAKE ONE PACKET Rabadan. as needed) 30 each 11  . diphenoxylate-atropine (LOMOTIL) 2.5-0.025 MG per tablet TAKE 1 OR 2 TABLETS Park AS NEEDED FOR DIARRHEA OR LOOSE STOOLS. 60 tablet 0  . famotidine (PEPCID) 20 MG tablet TAKE 1 TABLET TWICE Cothron. 60 tablet 6  . glucosamine-chondroitin 500-400 MG tablet Take 1 tablet by mouth 2 (two) times Michelli.     . hydrocortisone (PROCTOSOL HC) 2.5 % rectal cream Use the cream with applicator, insert small amount into the rectal area 2-3 times Elie. 30 g 1  . levothyroxine (SYNTHROID, LEVOTHROID) 75 MCG tablet TAKE 1 TABLET EACH DAY. 90 tablet 1  . loperamide (IMODIUM A-D) 2 MG tablet Take 4 mg by mouth as needed.     . Multiple Vitamin (MULTIVITAMIN) tablet Take 1 tablet by mouth Kratz.       No current facility-administered medications for this visit.    Allergies as of 12/27/2015 - Review Complete 08/09/2015  Allergen Reaction Noted  . Penicillins Diarrhea 08/09/2015  . Ace inhibitors  07/19/2008  . Hydrocodone  06/09/2008    Family History  Problem Relation Age of Onset  . Stomach cancer Mother   . Arthritis Mother   . Leukemia Father   . Kidney failure Father   . Anuerysm Brother     brain  . ALS Sister   .  Stomach cancer Maternal Aunt   . Esophageal cancer Neg Hx   . Rectal cancer Neg Hx   . Colon cancer Daughter     Social History   Social History  . Marital Status: Widowed    Spouse Name: N/A  . Number of Children: 3  . Years of Education: N/A   Occupational History  . retired    Social History Main Topics  . Smoking status: Former Smoker -- 0.50 packs/day for 20 years    Types: Cigarettes    Quit date: 09/05/1973  . Smokeless tobacco: Never Used  . Alcohol Use: Yes     Comment: ocass  . Drug Use: No  . Sexual Activity: Not on file   Other Topics Concern  . Not on file   Social  History Narrative     Physical Exam: BP 122/80 mmHg  Pulse 60  Ht   Wt  Constitutional: generally well-appearing Psychiatric: alert and oriented x3 Abdomen: soft, nontender, nondistended, no obvious ascites, no peritoneal signs, normal bowel sounds   Assessment and plan: 82 y.o. female with Altering constipation diarrhea  I recommended she try to avoid taking Imodium and laxatives. Seems like she is taking one medicine to treat his symptom and this is causing another symptom for which she takes another medicine. Recommended instead that she stopped these altogether and instead take fiber supplements on a Lenz basis. She will call my office in 4-5 weeks to report on her response.   Owens Loffler, MD [facility] 12/27/2015, 2:26 PM      .

## 2015-12-27 NOTE — Patient Instructions (Signed)
Please start taking citrucel (orange flavored) powder fiber supplement.  This may cause some bloating at first but that usually goes away. Begin with a small spoonful and work your way up to a large, heaping spoonful Copado over a week. Call in 4-5 weeks to report on your response.

## 2016-01-11 ENCOUNTER — Telehealth: Payer: Self-pay | Admitting: Gastroenterology

## 2016-01-12 NOTE — Telephone Encounter (Signed)
Left message on machine to call back  

## 2016-01-12 NOTE — Telephone Encounter (Signed)
Pt says 1 tablespoon citrucel has helped her constipation, but she now has very frequent stools. She was advised to back down to 1/2 tablespoon and call back in a week to update.  Forwarded to Dr Ardis Hughs for review and further recommendations.

## 2016-01-13 NOTE — Telephone Encounter (Signed)
i agree, thanks 

## 2016-01-16 ENCOUNTER — Other Ambulatory Visit: Payer: Self-pay | Admitting: Internal Medicine

## 2016-02-03 DIAGNOSIS — H04123 Dry eye syndrome of bilateral lacrimal glands: Secondary | ICD-10-CM | POA: Diagnosis not present

## 2016-02-03 DIAGNOSIS — H353131 Nonexudative age-related macular degeneration, bilateral, early dry stage: Secondary | ICD-10-CM | POA: Diagnosis not present

## 2016-02-03 DIAGNOSIS — H5022 Vertical strabismus, left eye: Secondary | ICD-10-CM | POA: Diagnosis not present

## 2016-02-03 DIAGNOSIS — Z961 Presence of intraocular lens: Secondary | ICD-10-CM | POA: Diagnosis not present

## 2016-02-06 ENCOUNTER — Encounter: Payer: Self-pay | Admitting: Internal Medicine

## 2016-02-06 ENCOUNTER — Ambulatory Visit (INDEPENDENT_AMBULATORY_CARE_PROVIDER_SITE_OTHER): Payer: Medicare Other | Admitting: Internal Medicine

## 2016-02-06 VITALS — BP 140/80 | HR 75 | Temp 98.1°F

## 2016-02-06 DIAGNOSIS — K582 Mixed irritable bowel syndrome: Secondary | ICD-10-CM | POA: Diagnosis not present

## 2016-02-06 DIAGNOSIS — I350 Nonrheumatic aortic (valve) stenosis: Secondary | ICD-10-CM | POA: Diagnosis not present

## 2016-02-06 DIAGNOSIS — I10 Essential (primary) hypertension: Secondary | ICD-10-CM | POA: Diagnosis not present

## 2016-02-06 DIAGNOSIS — M179 Osteoarthritis of knee, unspecified: Secondary | ICD-10-CM

## 2016-02-06 DIAGNOSIS — IMO0002 Reserved for concepts with insufficient information to code with codable children: Secondary | ICD-10-CM

## 2016-02-06 DIAGNOSIS — M1710 Unilateral primary osteoarthritis, unspecified knee: Secondary | ICD-10-CM

## 2016-02-06 LAB — CBC WITH DIFFERENTIAL/PLATELET
BASOS ABS: 0 10*3/uL (ref 0.0–0.1)
Basophils Relative: 0.3 % (ref 0.0–3.0)
EOS PCT: 1.2 % (ref 0.0–5.0)
Eosinophils Absolute: 0.1 10*3/uL (ref 0.0–0.7)
HCT: 34.9 % — ABNORMAL LOW (ref 36.0–46.0)
HEMOGLOBIN: 11.5 g/dL — AB (ref 12.0–15.0)
Lymphocytes Relative: 29.9 % (ref 12.0–46.0)
Lymphs Abs: 2.6 10*3/uL (ref 0.7–4.0)
MCHC: 33.1 g/dL (ref 30.0–36.0)
MCV: 86.4 fl (ref 78.0–100.0)
MONOS PCT: 6.6 % (ref 3.0–12.0)
Monocytes Absolute: 0.6 10*3/uL (ref 0.1–1.0)
NEUTROS PCT: 62 % (ref 43.0–77.0)
Neutro Abs: 5.5 10*3/uL (ref 1.4–7.7)
Platelets: 229 10*3/uL (ref 150.0–400.0)
RBC: 4.04 Mil/uL (ref 3.87–5.11)
RDW: 15.4 % (ref 11.5–15.5)
WBC: 8.8 10*3/uL (ref 4.0–10.5)

## 2016-02-06 LAB — COMPREHENSIVE METABOLIC PANEL
ALT: 11 U/L (ref 0–35)
AST: 14 U/L (ref 0–37)
Albumin: 3.9 g/dL (ref 3.5–5.2)
Alkaline Phosphatase: 78 U/L (ref 39–117)
BUN: 14 mg/dL (ref 6–23)
CHLORIDE: 107 meq/L (ref 96–112)
CO2: 28 meq/L (ref 19–32)
CREATININE: 0.74 mg/dL (ref 0.40–1.20)
Calcium: 9.5 mg/dL (ref 8.4–10.5)
GFR: 77.53 mL/min (ref >60.00)
GLUCOSE: 133 mg/dL — AB (ref 70–99)
Potassium: 3.7 mEq/L (ref 3.5–5.1)
SODIUM: 144 meq/L (ref 135–145)
Total Bilirubin: 1 mg/dL (ref 0.2–1.2)
Total Protein: 6.2 g/dL (ref 6.0–8.3)

## 2016-02-06 LAB — TSH: TSH: 0.26 u[IU]/mL — AB (ref 0.35–4.50)

## 2016-02-06 NOTE — Progress Notes (Signed)
Subjective:    Patient ID: Susan [name], female    DOB: 30-Mar-1919, 82 y.o.   MRN: [id number]  HPI   82 year old patient who has essential hypertension. She has hypothyroidism. She has been seen by GI recently with IBS with alternating constipation and diarrhea  She had a fall 7 days ago and has some persistent right hip pain, but this is larger resolved. She does have some osteoarthritis.  She has  Aortic stenosis   No recent lab  Past Medical History:  Diagnosis Date  . Aortic stenosis   . CARCINOMA, BLADDER, TRANSITIONAL CELL 10/24/2007  . HYPERTENSION 01/07/2007  . HYPOTHYROIDISM 01/07/2007  . IBS 05/03/2008  . MIXED HEARING LOSS BILATERAL 09/28/2009  . OSTEOARTHRITIS, KNEE 04/25/2007  . UNSPECIFIED ARTHROPATHY MULTIPLE SITES 10/26/2009     Social History   Social History  . Marital status: Widowed    Spouse name: N/A  . Number of children: 3  . Years of education: N/A   Occupational History  . retired Retired   Social History Main Topics  . Smoking status: Former Smoker    Packs/day: 0.50    Years: 20.00    Types: Cigarettes    Quit date: 09/05/1973  . Smokeless tobacco: Never Used  . Alcohol use Yes     Comment: ocass  . Drug use: No  . Sexual activity: Not on file   Other Topics Concern  . Not on file   Social History Narrative  . No narrative on file    Past Surgical History:  Procedure Laterality Date  . BELPHAROPTOSIS REPAIR    . BLADDER TUMOR EXCISION    . BUNIONECTOMY     bilateral  . CATARACT EXTRACTION     bilateral  . TONSILLECTOMY AND ADENOIDECTOMY     x2    Family History  Problem Relation Age of Onset  . Stomach cancer Mother   . Arthritis Mother   . Leukemia Father   . Kidney failure Father   . Anuerysm Brother     brain  . ALS Sister   . Stomach cancer Maternal Aunt   . Esophageal cancer Neg Hx   . Rectal cancer Neg Hx   . Colon cancer Daughter     Allergies  Allergen Reactions  . Penicillins Diarrhea  . Ace  Inhibitors     REACTION: cough  . Hydrocodone     REACTION: Hallucinations---minor    Current Outpatient Prescriptions on File Prior to Visit  Medication Sig Dispense Refill  . acetaminophen (TYLENOL) 500 MG tablet Take 1,000 mg by mouth Repka. And 1 at bedtime     . aspirin 81 MG tablet Take 1 tablet (81 mg total) by mouth [place]. 30 tablet   . atorvastatin (LIPITOR) 20 MG tablet TAKE 1 TABLET ONCE Jolley. 90 tablet 1  . calcium carbonate (TUMS) 500 MG chewable tablet Chew 1 tablet by mouth as needed.      . carvedilol (COREG) 12.5 MG tablet TAKE  (1)  TABLET TWICE A DAY WITH MEALS (BREAKFAST AND SUPPER) 180 tablet 3  . diphenoxylate-atropine (LOMOTIL) 2.5-0.025 MG per tablet TAKE 1 OR 2 TABLETS Laidler AS NEEDED FOR DIARRHEA OR LOOSE STOOLS. 60 tablet 0  . famotidine (PEPCID) 20 MG tablet TAKE 1 TABLET TWICE Arshad. 60 tablet 6  . glucosamine-chondroitin 500-400 MG tablet Take 1 tablet by mouth 2 (two) times Mcmurtry.     . hydrocortisone (PROCTOSOL HC) 2.5 % rectal cream Use the cream with applicator, insert small amount  into the rectal area 2-3 times Dubach. 30 g 1  . levothyroxine (SYNTHROID, LEVOTHROID) 75 MCG tablet TAKE 1 TABLET EACH DAY. 90 tablet 1  . loperamide (IMODIUM A-D) 2 MG tablet Take 4 mg by mouth as needed.     . Multiple Vitamin (MULTIVITAMIN) tablet Take 1 tablet by mouth Mella.       No current facility-administered medications on file prior to visit.     BP 140/80 (BP Location: Left Arm, Patient Position: Sitting, Cuff Size: Normal)   Pulse 75   Temp 98.1 F (36.7 C) (Oral)   SpO2 97%     Review of Systems  Constitutional: Negative.   HENT: Negative for congestion, dental problem, hearing loss, rhinorrhea, sinus pressure, sore throat and tinnitus.   Eyes: Negative for pain, discharge and visual disturbance.  Respiratory: Negative for cough and shortness of breath.   Cardiovascular: Negative for chest pain, palpitations and leg swelling.  Gastrointestinal: Positive  for constipation and diarrhea. Negative for abdominal distention, abdominal pain, blood in stool, nausea and vomiting.  Genitourinary: Negative for difficulty urinating, dysuria, flank pain, frequency, hematuria, pelvic pain, urgency, vaginal bleeding, vaginal discharge and vaginal pain.  Musculoskeletal: Positive for arthralgias, back pain and gait problem. Negative for joint swelling.  Skin: Negative for rash.  Neurological: Negative for dizziness, syncope, speech difficulty, weakness, numbness and headaches.  Hematological: Negative for adenopathy.  Psychiatric/Behavioral: Negative for agitation, behavioral problems and dysphoric mood. The patient is not nervous/anxious.        Objective:   Physical Exam  Constitutional: She is oriented to person, place, and time. She appears well-developed and well-nourished.  HENT:  Head: Normocephalic.  Right Ear: External ear normal.  Left Ear: External ear normal.  Mouth/Throat: Oropharynx is clear and moist.  Eyes: Conjunctivae and EOM are normal. Pupils are equal, round, and reactive to light.  Neck: Normal range of motion. Neck supple. No thyromegaly present.  Cardiovascular: Normal rate, regular rhythm and intact distal pulses.   Murmur heard. Grade 4/6 systolic murmur with radiation to the carotid distribution  Pulmonary/Chest: Effort normal and breath sounds normal.  Abdominal: Soft. Bowel sounds are normal. She exhibits no distension and no mass. There is no tenderness. There is no guarding.  Musculoskeletal: Normal range of motion. She exhibits no edema.  Lymphadenopathy:    She has no cervical adenopathy.  Neurological: She is alert and oriented to person, place, and time.  Skin: Skin is warm and dry. No rash noted.  Psychiatric: She has a normal mood and affect. Her behavior is normal.          Assessment & Plan:   , aortic stenosis , essential hypertension, stable .  IBS .  Right hip pain.  This has a larger resolved after  a fall 7 days ago.  We'll continue to observe .  Hypothyroidism.  We'll check a TSH  .  Follow-up 6 months  Nyoka Cowden, MD

## 2016-02-06 NOTE — Patient Instructions (Signed)
Limit your sodium (Salt) intake  .  High-fiber diet  Please check your blood pressure on a regular basis.  If it is consistently greater than 150/90, please make an office appointment.   .  Return in 6 months for follow-up

## 2016-02-06 NOTE — Progress Notes (Signed)
Pre visit review using our clinic review tool, if applicable. No additional management support is needed unless otherwise documented below in the visit note. 

## 2016-02-07 NOTE — Progress Notes (Signed)
HPI The patient presents for follow up of AS.  Since I last saw her she has done well from a cardiovascular standpoint. She did have a fall the other day in her bathroom. She had a little dizzy but she didn't have presyncope or syncope. She's had this before but not frequently. She's going to get x-rays of her hip. Prior to this she was ambulating with a walker. She will use her motorized wheelchair for long walks. She wasn't having any cardiovascular symptoms. The patient denies any new symptoms such as chest discomfort, neck or arm discomfort. There has been no new shortness of breath, PND or orthopnea. There have been no reported palpitations, presyncope or syncope.   Allergies  Allergen Reactions  . Penicillins Diarrhea  . Ace Inhibitors     REACTION: cough  . Hydrocodone     REACTION: Hallucinations---minor    Current Outpatient Prescriptions  Medication Sig Dispense Refill  . acetaminophen (TYLENOL) 500 MG tablet Take 1,000 mg by mouth Clear. And 1 at bedtime     . aspirin 81 MG tablet Take 1 tablet (81 mg total) by mouth Savino. 30 tablet   . atorvastatin (LIPITOR) 20 MG tablet TAKE 1 TABLET ONCE Emel. 90 tablet 1  . calcium carbonate (TUMS) 500 MG chewable tablet Chew 1 tablet by mouth as needed.      . carvedilol (COREG) 12.5 MG tablet TAKE  (1)  TABLET TWICE A DAY WITH MEALS (BREAKFAST AND SUPPER) 180 tablet 3  . diphenoxylate-atropine (LOMOTIL) 2.5-0.025 MG per tablet TAKE 1 OR 2 TABLETS Robeck AS NEEDED FOR DIARRHEA OR LOOSE STOOLS. 60 tablet 0  . famotidine (PEPCID) 20 MG tablet TAKE 1 TABLET TWICE Foresta. 60 tablet 6  . glucosamine-chondroitin 500-400 MG tablet Take 1 tablet by mouth 2 (two) times Kuenzel.     . hydrocortisone (PROCTOSOL HC) 2.5 % rectal cream Use the cream with applicator, insert small amount into the rectal area 2-3 times Jurado. 30 g 1  . levothyroxine (SYNTHROID, LEVOTHROID) 75 MCG tablet TAKE 1 TABLET EACH DAY. 90 tablet 1  . loperamide (IMODIUM A-D) 2 MG  tablet Take 4 mg by mouth as needed.     . Multiple Vitamin (MULTIVITAMIN) tablet Take 1 tablet by mouth Leavey.       No current facility-administered medications for this visit.     Past Medical History:  Diagnosis Date  . Aortic stenosis   . CARCINOMA, BLADDER, TRANSITIONAL CELL 10/24/2007  . HYPERTENSION 01/07/2007  . HYPOTHYROIDISM 01/07/2007  . IBS 05/03/2008  . MIXED HEARING LOSS BILATERAL 09/28/2009  . OSTEOARTHRITIS, KNEE 04/25/2007  . UNSPECIFIED ARTHROPATHY MULTIPLE SITES 10/26/2009    Past Surgical History:  Procedure Laterality Date  . BELPHAROPTOSIS REPAIR    . BLADDER TUMOR EXCISION    . BUNIONECTOMY     bilateral  . CATARACT EXTRACTION     bilateral  . TONSILLECTOMY AND ADENOIDECTOMY     x2    ROS:   Hip pain.  Otherwise as stated in the HPI and negative for all other systems.  PHYSICAL EXAM BP (!) 148/76   Pulse 69   Ht 5\' 1"  (1.549 m)   Wt 177 lb (80.3 kg)   BMI 33.44 kg/m  GENERAL:  Well appearing for her age.  NECK:  No jugular venous distention, waveform within normal limits, carotid upstroke brisk and symmetric, bilateral bruits, no thyromegaly LUNGS:  Clear to auscultation bilaterally CHEST:  Unremarkable HEART:  PMI not displaced or sustained,S1 and S2  within normal limits, no S3, no S4, no clicks, no rubs, apical systolic murmur mid to late peaking radiating out the outflow tract ABD:  Flat, positive bowel sounds normal in frequency in pitch, no bruits, no rebound, no guarding, no midline pulsatile mass, no hepatomegaly, no splenomegaly EXT:  2 plus pulses throughout, no edema, no cyanosis no clubbing  EKG:  Sinus rhythm, rate 69, left axis deviation, interventricular conduction delay, no acute ST-T wave changes. The IVCD is slightly more pronounced than previous  Mar 04, 2016   ASSESSMENT AND PLAN   Aortic stenosis - The patient has no new symptoms. No change in therapy is indicated. She certainly would be medical management only.  HTN - The blood  pressure is at target. No change in medications is indicated. We will continue with therapeutic lifestyle changes (TLC).  Her blood pressure is actually been low and she is doing better off the HCTZ

## 2016-02-08 ENCOUNTER — Telehealth: Payer: Self-pay | Admitting: Internal Medicine

## 2016-02-08 DIAGNOSIS — M25551 Pain in right hip: Secondary | ICD-10-CM

## 2016-02-08 NOTE — Telephone Encounter (Signed)
Pt is not feeling better pt state that she has suffered through the night and would like to have xray done.  Pt state she came in on 7/31 for the bad fall that she had.  Pt would like to be referred to Dr. Synthia Innocent for that xray.

## 2016-02-09 ENCOUNTER — Ambulatory Visit (INDEPENDENT_AMBULATORY_CARE_PROVIDER_SITE_OTHER): Payer: Medicare Other | Admitting: Cardiology

## 2016-02-09 ENCOUNTER — Encounter: Payer: Self-pay | Admitting: Cardiology

## 2016-02-09 VITALS — BP 148/76 | HR 69 | Ht 61.0 in | Wt 177.0 lb

## 2016-02-09 DIAGNOSIS — S300XXA Contusion of lower back and pelvis, initial encounter: Secondary | ICD-10-CM | POA: Diagnosis not present

## 2016-02-09 DIAGNOSIS — I10 Essential (primary) hypertension: Secondary | ICD-10-CM | POA: Diagnosis not present

## 2016-02-09 DIAGNOSIS — I350 Nonrheumatic aortic (valve) stenosis: Secondary | ICD-10-CM

## 2016-02-09 DIAGNOSIS — M545 Low back pain: Secondary | ICD-10-CM | POA: Diagnosis not present

## 2016-02-09 NOTE — Patient Instructions (Signed)
Medication Instructions:  Continue current medications  Labwork: NONE  Testing/Procedures: NONE  Follow-Up: Your physician wants you to follow-up in: 1 Year. You will receive a reminder letter in the mail two months in advance. If you don't receive a letter, please call our office to schedule the follow-up appointment.   Any Other Special Instructions Will Be Listed Below (If Applicable).   If you need a refill on your cardiac medications before your next appointment, please call your pharmacy.   

## 2016-02-10 NOTE — Telephone Encounter (Signed)
X-ray right hip Okay to refer to orthopedics if appointment can be made today

## 2016-02-10 NOTE — Telephone Encounter (Signed)
Do x-ray only at this time

## 2016-02-10 NOTE — Telephone Encounter (Signed)
Patient had x-rays yesterday, will cancel x-ray placed today.

## 2016-02-10 NOTE — Telephone Encounter (Signed)
X-ray placed, will inform patient. Dr. Peri Maris office only open on Mondays & Tuesdays. Would you still like to do orthopedic referral?

## 2016-02-14 DIAGNOSIS — R2689 Other abnormalities of gait and mobility: Secondary | ICD-10-CM | POA: Diagnosis not present

## 2016-02-14 DIAGNOSIS — R278 Other lack of coordination: Secondary | ICD-10-CM | POA: Diagnosis not present

## 2016-02-14 DIAGNOSIS — S300XXS Contusion of lower back and pelvis, sequela: Secondary | ICD-10-CM | POA: Diagnosis not present

## 2016-02-14 DIAGNOSIS — M545 Low back pain: Secondary | ICD-10-CM | POA: Diagnosis not present

## 2016-02-14 DIAGNOSIS — Z9181 History of falling: Secondary | ICD-10-CM | POA: Diagnosis not present

## 2016-02-16 DIAGNOSIS — M545 Low back pain: Secondary | ICD-10-CM | POA: Diagnosis not present

## 2016-02-16 DIAGNOSIS — R278 Other lack of coordination: Secondary | ICD-10-CM | POA: Diagnosis not present

## 2016-02-16 DIAGNOSIS — S300XXS Contusion of lower back and pelvis, sequela: Secondary | ICD-10-CM | POA: Diagnosis not present

## 2016-02-16 DIAGNOSIS — Z9181 History of falling: Secondary | ICD-10-CM | POA: Diagnosis not present

## 2016-02-16 DIAGNOSIS — R2689 Other abnormalities of gait and mobility: Secondary | ICD-10-CM | POA: Diagnosis not present

## 2016-02-22 DIAGNOSIS — R278 Other lack of coordination: Secondary | ICD-10-CM | POA: Diagnosis not present

## 2016-02-22 DIAGNOSIS — Z9181 History of falling: Secondary | ICD-10-CM | POA: Diagnosis not present

## 2016-02-22 DIAGNOSIS — M545 Low back pain: Secondary | ICD-10-CM | POA: Diagnosis not present

## 2016-02-22 DIAGNOSIS — R2689 Other abnormalities of gait and mobility: Secondary | ICD-10-CM | POA: Diagnosis not present

## 2016-02-22 DIAGNOSIS — S300XXS Contusion of lower back and pelvis, sequela: Secondary | ICD-10-CM | POA: Diagnosis not present

## 2016-02-24 DIAGNOSIS — Z9181 History of falling: Secondary | ICD-10-CM | POA: Diagnosis not present

## 2016-02-24 DIAGNOSIS — R278 Other lack of coordination: Secondary | ICD-10-CM | POA: Diagnosis not present

## 2016-02-24 DIAGNOSIS — S300XXS Contusion of lower back and pelvis, sequela: Secondary | ICD-10-CM | POA: Diagnosis not present

## 2016-02-24 DIAGNOSIS — R2689 Other abnormalities of gait and mobility: Secondary | ICD-10-CM | POA: Diagnosis not present

## 2016-02-24 DIAGNOSIS — M545 Low back pain: Secondary | ICD-10-CM | POA: Diagnosis not present

## 2016-02-27 DIAGNOSIS — Z9181 History of falling: Secondary | ICD-10-CM | POA: Diagnosis not present

## 2016-02-27 DIAGNOSIS — S300XXS Contusion of lower back and pelvis, sequela: Secondary | ICD-10-CM | POA: Diagnosis not present

## 2016-02-27 DIAGNOSIS — R278 Other lack of coordination: Secondary | ICD-10-CM | POA: Diagnosis not present

## 2016-02-27 DIAGNOSIS — M545 Low back pain: Secondary | ICD-10-CM | POA: Diagnosis not present

## 2016-02-27 DIAGNOSIS — R2689 Other abnormalities of gait and mobility: Secondary | ICD-10-CM | POA: Diagnosis not present

## 2016-03-01 DIAGNOSIS — R2689 Other abnormalities of gait and mobility: Secondary | ICD-10-CM | POA: Diagnosis not present

## 2016-03-01 DIAGNOSIS — Z9181 History of falling: Secondary | ICD-10-CM | POA: Diagnosis not present

## 2016-03-01 DIAGNOSIS — S300XXS Contusion of lower back and pelvis, sequela: Secondary | ICD-10-CM | POA: Diagnosis not present

## 2016-03-01 DIAGNOSIS — M545 Low back pain: Secondary | ICD-10-CM | POA: Diagnosis not present

## 2016-03-01 DIAGNOSIS — R278 Other lack of coordination: Secondary | ICD-10-CM | POA: Diagnosis not present

## 2016-03-05 DIAGNOSIS — R278 Other lack of coordination: Secondary | ICD-10-CM | POA: Diagnosis not present

## 2016-03-05 DIAGNOSIS — Z9181 History of falling: Secondary | ICD-10-CM | POA: Diagnosis not present

## 2016-03-05 DIAGNOSIS — M545 Low back pain: Secondary | ICD-10-CM | POA: Diagnosis not present

## 2016-03-05 DIAGNOSIS — R2689 Other abnormalities of gait and mobility: Secondary | ICD-10-CM | POA: Diagnosis not present

## 2016-03-05 DIAGNOSIS — S300XXS Contusion of lower back and pelvis, sequela: Secondary | ICD-10-CM | POA: Diagnosis not present

## 2016-03-08 DIAGNOSIS — Z9181 History of falling: Secondary | ICD-10-CM | POA: Diagnosis not present

## 2016-03-08 DIAGNOSIS — R278 Other lack of coordination: Secondary | ICD-10-CM | POA: Diagnosis not present

## 2016-03-08 DIAGNOSIS — S300XXS Contusion of lower back and pelvis, sequela: Secondary | ICD-10-CM | POA: Diagnosis not present

## 2016-03-08 DIAGNOSIS — R2689 Other abnormalities of gait and mobility: Secondary | ICD-10-CM | POA: Diagnosis not present

## 2016-03-08 DIAGNOSIS — M545 Low back pain: Secondary | ICD-10-CM | POA: Diagnosis not present

## 2016-03-13 ENCOUNTER — Other Ambulatory Visit: Payer: Self-pay | Admitting: Gastroenterology

## 2016-03-13 DIAGNOSIS — S300XXS Contusion of lower back and pelvis, sequela: Secondary | ICD-10-CM | POA: Diagnosis not present

## 2016-03-13 DIAGNOSIS — R2689 Other abnormalities of gait and mobility: Secondary | ICD-10-CM | POA: Diagnosis not present

## 2016-03-13 DIAGNOSIS — Z9181 History of falling: Secondary | ICD-10-CM | POA: Diagnosis not present

## 2016-03-13 DIAGNOSIS — M545 Low back pain: Secondary | ICD-10-CM | POA: Diagnosis not present

## 2016-03-13 DIAGNOSIS — R278 Other lack of coordination: Secondary | ICD-10-CM | POA: Diagnosis not present

## 2016-03-14 ENCOUNTER — Encounter (HOSPITAL_COMMUNITY): Payer: Self-pay | Admitting: *Deleted

## 2016-03-14 ENCOUNTER — Inpatient Hospital Stay (HOSPITAL_COMMUNITY)
Admission: EM | Admit: 2016-03-14 | Discharge: 2016-03-16 | DRG: 309 | Disposition: A | Discharge status: Skilled Nursing Facility | Payer: Medicare Other | Attending: Internal Medicine | Admitting: Internal Medicine

## 2016-03-14 ENCOUNTER — Emergency Department (HOSPITAL_COMMUNITY): Payer: Medicare Other

## 2016-03-14 DIAGNOSIS — R079 Chest pain, unspecified: Secondary | ICD-10-CM

## 2016-03-14 DIAGNOSIS — R0789 Other chest pain: Secondary | ICD-10-CM | POA: Diagnosis not present

## 2016-03-14 DIAGNOSIS — Z885 Allergy status to narcotic agent status: Secondary | ICD-10-CM

## 2016-03-14 DIAGNOSIS — I480 Paroxysmal atrial fibrillation: Secondary | ICD-10-CM | POA: Diagnosis not present

## 2016-03-14 DIAGNOSIS — E785 Hyperlipidemia, unspecified: Secondary | ICD-10-CM | POA: Diagnosis present

## 2016-03-14 DIAGNOSIS — Z7982 Long term (current) use of aspirin: Secondary | ICD-10-CM

## 2016-03-14 DIAGNOSIS — K219 Gastro-esophageal reflux disease without esophagitis: Secondary | ICD-10-CM | POA: Diagnosis not present

## 2016-03-14 DIAGNOSIS — I248 Other forms of acute ischemic heart disease: Secondary | ICD-10-CM | POA: Diagnosis not present

## 2016-03-14 DIAGNOSIS — I959 Hypotension, unspecified: Secondary | ICD-10-CM | POA: Diagnosis not present

## 2016-03-14 DIAGNOSIS — I350 Nonrheumatic aortic (valve) stenosis: Secondary | ICD-10-CM | POA: Diagnosis not present

## 2016-03-14 DIAGNOSIS — E039 Hypothyroidism, unspecified: Secondary | ICD-10-CM | POA: Diagnosis present

## 2016-03-14 DIAGNOSIS — H908 Mixed conductive and sensorineural hearing loss, unspecified: Secondary | ICD-10-CM | POA: Diagnosis present

## 2016-03-14 DIAGNOSIS — Z888 Allergy status to other drugs, medicaments and biological substances status: Secondary | ICD-10-CM

## 2016-03-14 DIAGNOSIS — I4891 Unspecified atrial fibrillation: Secondary | ICD-10-CM | POA: Diagnosis not present

## 2016-03-14 DIAGNOSIS — Z880 Allergy status to penicillin: Secondary | ICD-10-CM

## 2016-03-14 DIAGNOSIS — Z8551 Personal history of malignant neoplasm of bladder: Secondary | ICD-10-CM

## 2016-03-14 DIAGNOSIS — I10 Essential (primary) hypertension: Secondary | ICD-10-CM | POA: Diagnosis present

## 2016-03-14 DIAGNOSIS — R072 Precordial pain: Secondary | ICD-10-CM | POA: Diagnosis not present

## 2016-03-14 DIAGNOSIS — R296 Repeated falls: Secondary | ICD-10-CM | POA: Diagnosis present

## 2016-03-14 DIAGNOSIS — I160 Hypertensive urgency: Secondary | ICD-10-CM | POA: Diagnosis present

## 2016-03-14 DIAGNOSIS — Z87891 Personal history of nicotine dependence: Secondary | ICD-10-CM

## 2016-03-14 DIAGNOSIS — Z79899 Other long term (current) drug therapy: Secondary | ICD-10-CM

## 2016-03-14 HISTORY — DX: Gastro-esophageal reflux disease without esophagitis: K21.9

## 2016-03-14 HISTORY — DX: Unspecified atrial fibrillation: I48.91

## 2016-03-14 LAB — CBC WITH DIFFERENTIAL/PLATELET
BASOS ABS: 0 10*3/uL (ref 0.0–0.1)
BASOS PCT: 0 %
EOS ABS: 0.1 10*3/uL (ref 0.0–0.7)
EOS PCT: 1 %
HCT: 39.2 % (ref 36.0–46.0)
Hemoglobin: 12.1 g/dL (ref 12.0–15.0)
LYMPHS ABS: 1.3 10*3/uL (ref 0.7–4.0)
Lymphocytes Relative: 11 %
MCH: 28.4 pg (ref 26.0–34.0)
MCHC: 30.9 g/dL (ref 30.0–36.0)
MCV: 92 fL (ref 78.0–100.0)
Monocytes Absolute: 0.8 10*3/uL (ref 0.1–1.0)
Monocytes Relative: 6 %
Neutro Abs: 9.9 10*3/uL — ABNORMAL HIGH (ref 1.7–7.7)
Neutrophils Relative %: 82 %
PLATELETS: 207 10*3/uL (ref 150–400)
RBC: 4.26 MIL/uL (ref 3.87–5.11)
RDW: 15.2 % (ref 11.5–15.5)
WBC: 12 10*3/uL — AB (ref 4.0–10.5)

## 2016-03-14 LAB — TROPONIN I
Troponin I: 0.68 ng/mL (ref <0.03)
Troponin I: 1.08 ng/mL (ref <0.03)

## 2016-03-14 LAB — BASIC METABOLIC PANEL
ANION GAP: 8 (ref 5–15)
BUN: 14 mg/dL (ref 6–20)
CALCIUM: 9.1 mg/dL (ref 8.9–10.3)
CO2: 25 mmol/L (ref 22–32)
Chloride: 109 mmol/L (ref 101–111)
Creatinine, Ser: 0.78 mg/dL (ref 0.44–1.00)
Glucose, Bld: 128 mg/dL — ABNORMAL HIGH (ref 65–99)
POTASSIUM: 3.5 mmol/L (ref 3.5–5.1)
Sodium: 142 mmol/L (ref 135–145)

## 2016-03-14 LAB — TSH: TSH: 0.18 u[IU]/mL — ABNORMAL LOW (ref 0.350–4.500)

## 2016-03-14 LAB — I-STAT TROPONIN, ED: TROPONIN I, POC: 0.07 ng/mL (ref 0.00–0.08)

## 2016-03-14 LAB — PROTIME-INR
INR: 1.11
PROTHROMBIN TIME: 14.4 s (ref 11.4–15.2)

## 2016-03-14 MED ORDER — SODIUM CHLORIDE 0.9 % IV SOLN
INTRAVENOUS | Status: DC
  Administered 2016-03-14: 14:00:00 via INTRAVENOUS

## 2016-03-14 MED ORDER — SODIUM CHLORIDE 0.9 % IV SOLN
INTRAVENOUS | Status: AC
  Administered 2016-03-14: 17:00:00 via INTRAVENOUS

## 2016-03-14 MED ORDER — HEPARIN (PORCINE) IN NACL 100-0.45 UNIT/ML-% IJ SOLN
800.0000 [IU]/h | INTRAMUSCULAR | Status: DC
  Administered 2016-03-15: 900 [IU]/h via INTRAVENOUS
  Filled 2016-03-14 (×2): qty 250

## 2016-03-14 MED ORDER — CARVEDILOL 12.5 MG PO TABS
12.5000 mg | ORAL_TABLET | Freq: Two times a day (BID) | ORAL | Status: DC
  Administered 2016-03-14 – 2016-03-16 (×4): 12.5 mg via ORAL
  Filled 2016-03-14 (×4): qty 1

## 2016-03-14 MED ORDER — HEPARIN BOLUS VIA INFUSION
2000.0000 [IU] | Freq: Once | INTRAVENOUS | Status: AC
  Administered 2016-03-15: 2000 [IU] via INTRAVENOUS
  Filled 2016-03-14: qty 2000

## 2016-03-14 MED ORDER — ACETAMINOPHEN 325 MG PO TABS: 650.0000 mg | ORAL_TABLET | Freq: Four times a day (QID) | ORAL | Status: DC | PRN

## 2016-03-14 MED ORDER — FAMOTIDINE 20 MG PO TABS
20.0000 mg | ORAL_TABLET | Freq: Two times a day (BID) | ORAL | Status: DC
  Administered 2016-03-14 – 2016-03-16 (×4): 20 mg via ORAL
  Filled 2016-03-14 (×4): qty 1

## 2016-03-14 MED ORDER — ATORVASTATIN CALCIUM 20 MG PO TABS
20.0000 mg | ORAL_TABLET | Freq: Every day | ORAL | Status: DC
  Administered 2016-03-15 – 2016-03-16 (×2): 20 mg via ORAL
  Filled 2016-03-14 (×3): qty 1

## 2016-03-14 MED ORDER — ENOXAPARIN SODIUM 40 MG/0.4ML ~~LOC~~ SOLN
40.0000 mg | SUBCUTANEOUS | Status: DC
Start: 2016-03-14 — End: 2016-03-14
  Administered 2016-03-14: 40 mg via SUBCUTANEOUS
  Filled 2016-03-14: qty 0.4

## 2016-03-14 MED ORDER — ONDANSETRON HCL 4 MG PO TABS: 4.0000 mg | ORAL_TABLET | Freq: Four times a day (QID) | ORAL | Status: DC | PRN

## 2016-03-14 MED ORDER — ASPIRIN 81 MG PO CHEW
81.0000 mg | CHEWABLE_TABLET | Freq: Every day | ORAL | Status: DC
Start: 2016-03-14 — End: 2016-03-16
  Administered 2016-03-14 – 2016-03-16 (×3): 81 mg via ORAL
  Filled 2016-03-14 (×3): qty 1

## 2016-03-14 MED ORDER — LEVOTHYROXINE SODIUM 75 MCG PO TABS
75.0000 ug | ORAL_TABLET | Freq: Every day | ORAL | Status: DC
  Filled 2016-03-14: qty 1

## 2016-03-14 MED ORDER — GI COCKTAIL ~~LOC~~
30.0000 mL | Freq: Once | ORAL | Status: AC
  Administered 2016-03-14: 30 mL via ORAL
  Filled 2016-03-14: qty 30

## 2016-03-14 MED ORDER — DILTIAZEM HCL 100 MG IV SOLR
5.0000 mg/h | Freq: Once | INTRAVENOUS | Status: AC
  Administered 2016-03-14: 5 mg/h via INTRAVENOUS
  Filled 2016-03-14: qty 100

## 2016-03-14 MED ORDER — SODIUM CHLORIDE 0.9 % IV BOLUS (SEPSIS)
250.0000 mL | Freq: Once | INTRAVENOUS | Status: AC
  Administered 2016-03-14: 250 mL via INTRAVENOUS

## 2016-03-14 MED ORDER — ACETAMINOPHEN 650 MG RE SUPP: 650.0000 mg | Freq: Four times a day (QID) | RECTAL | Status: DC | PRN

## 2016-03-14 MED ORDER — ONDANSETRON HCL 4 MG/2ML IJ SOLN
4.0000 mg | Freq: Four times a day (QID) | INTRAMUSCULAR | Status: DC | PRN
  Administered 2016-03-14: 4 mg via INTRAVENOUS
  Filled 2016-03-14: qty 2

## 2016-03-14 MED ORDER — CALCIUM CARBONATE ANTACID 500 MG PO CHEW: 1.0000 | CHEWABLE_TABLET | ORAL | Status: DC | PRN

## 2016-03-14 NOTE — ED Notes (Signed)
Placed patient on bedpan

## 2016-03-14 NOTE — Progress Notes (Signed)
ANTICOAGULATION CONSULT NOTE - Initial Consult  Pharmacy Consult for Heparin Indication: chest pain/ACS; afib  Allergies  Allergen Reactions  . Penicillins Diarrhea    Has patient had a PCN reaction causing immediate rash, facial/tongue/throat swelling, SOB or lightheadedness with hypotension: YES Has patient had a PCN reaction causing severe rash involving mucus membranes or skin necrosis:NO Has patient had a PCN reaction that required hospitalization NO Has patient had a PCN reaction occurring within the last 10 years: NO If all of the above answers are "NO", then may proceed with Cephalosporin use.  . Ace Inhibitors     REACTION: cough  . Hydrocodone     REACTION: Hallucinations---minor    Patient Measurements:    Wt: 80.3 kg  Ht :61 in IBW: 49 kg Heparin Dosing Weight: 67 kg  Vital Signs: Temp: 98 F (36.7 C) (09/06 2132) Temp Source: Oral (09/06 2132) BP: 147/78 (09/06 2132) Pulse Rate: 79 (09/06 2132)  Labs:  Recent Labs  03/14/16 1310 03/14/16 1823 03/14/16 2205  HGB 12.1  --   --   HCT 39.2  --   --   PLT 207  --   --   LABPROT 14.4  --   --   INR 1.11  --   --   CREATININE 0.78  --   --   TROPONINI  --  0.68* 1.08*    CrCl cannot be calculated (Unknown ideal weight.).   Medical History: Past Medical History:  Diagnosis Date  . Aortic stenosis   . Atrial fibrillation, new onset ([place])   . CARCINOMA, BLADDER, TRANSITIONAL CELL 10/24/2007  . GERD (gastroesophageal reflux disease)   . Heart murmur   . HYPERTENSION 01/07/2007  . HYPOTHYROIDISM 01/07/2007  . IBS 05/03/2008  . MIXED HEARING LOSS BILATERAL 09/28/2009  . OSTEOARTHRITIS, KNEE 04/25/2007  . UNSPECIFIED ARTHROPATHY MULTIPLE SITES 10/26/2009    Medications:  Prescriptions Prior to Admission  Medication Sig Dispense Refill Last Dose  . acetaminophen (TYLENOL) 500 MG tablet Take 1,000 mg by mouth every 6 (six) hours as needed for mild pain. And 1 at bedtime    03/14/2016 at Unknown time  .  aspirin 81 MG tablet Take 1 tablet (81 mg total) by mouth Rodgers. 30 tablet  03/13/2016 at Unknown time  . atorvastatin (LIPITOR) 20 MG tablet TAKE 1 TABLET ONCE Morua. 90 tablet 1 03/13/2016 at Unknown time  . calcium carbonate (TUMS) 500 MG chewable tablet Chew 1 tablet by mouth as needed.     03/14/2016 at Unknown time  . carvedilol (COREG) 12.5 MG tablet TAKE  (1)  TABLET TWICE A DAY WITH MEALS (BREAKFAST AND SUPPER) 180 tablet 3 03/13/2016 at 0800  . famotidine (PEPCID) 20 MG tablet TAKE 1 TABLET TWICE Dibbern. 60 tablet 0 03/13/2016 at Unknown time  . glucosamine-chondroitin 500-400 MG tablet Take 1 tablet by mouth Cirilo as needed. For bones and joints   03/14/2016 at Unknown time  . hydrocortisone (PROCTOSOL HC) 2.5 % rectal cream Use the cream with applicator, insert small amount into the rectal area 2-3 times Molino. (Patient taking differently: Place 1 application rectally Kerrigan as needed for hemorrhoids. Use the cream with applicator, insert small amount into the rectal area 2-3 times Heatherly.) 30 g 1 Past Month at Unknown time  . levothyroxine (SYNTHROID, LEVOTHROID) 75 MCG tablet TAKE 1 TABLET EACH DAY. 90 tablet 1 03/13/2016 at Unknown time  . loperamide (IMODIUM A-D) 2 MG tablet Take 4 mg by mouth as needed.    Past Week  at Unknown time  . Multiple Vitamin (MULTIVITAMIN) tablet Take 2 tablets by mouth Kellis. Patient states she is taking 2 tabs a day   03/14/2016 at Unknown time    Assessment: 82 y.o. F presents with CP, new onset afib. Pt with trop up to 1.08. Pharmacy consulted to begin heparin for ?NSTEMI and afib. CBC ok on admission. Pt received Lovenox 40mg  this evening at 1900.  Goal of Therapy:  Heparin level 0.3-0.7 units/ml Monitor platelets by anticoagulation protocol: Yes   Plan:  D/c Lovenox Heparin IV bolus 2000 units (small bolus with Lovenox given ~5hr ago) Heparin gtt at 900 units/hr Will f/u heparin level in 8 hours Indelicato heparin level and CBC  Sherlon Handing, PharmD, BCPS Clinical  pharmacist, pager [phone] 03/14/2016,11:25 PM

## 2016-03-14 NOTE — ED Notes (Signed)
Called main pharmacy and requested cardizem be tubed to Pod E.

## 2016-03-14 NOTE — ED Notes (Signed)
Called Cory, Pharmacist rt being unable to pull meds from pod a pyxis.

## 2016-03-14 NOTE — Progress Notes (Signed)
CRITICAL VALUE ALERT  Critical value received:  Troponin 0.68  Date of notification:  9/6  Time of notification:  1933  Critical value read back: yes  Nurse who received alert: Kerrie Buffalo  MD notified (1st page):  Lamar Blinks NP  Time of first page:  1936  MD notified (2nd page): N/A  Time of second page: N/A  Responding MD:  Lamar Blinks NP  Time MD responded:  1943; no new orders at this time

## 2016-03-14 NOTE — ED Notes (Signed)
Pt states her nausea has resolved.

## 2016-03-14 NOTE — ED Notes (Signed)
Placed patient on the bedpan  

## 2016-03-14 NOTE — ED Notes (Signed)
Pt converted to NSR. Dr. Rogene Houston made aware.

## 2016-03-14 NOTE — ED Notes (Signed)
X-ray at bedside

## 2016-03-14 NOTE — ED Triage Notes (Signed)
Pt arrives from [facility] living via [facility]. Pt states she began having centralized CP today and dizziness. Called onsite RN, RN called EMS rt tachycardia. Pt found to be in afib RVR with a rate going into 200's. Pt received 10mg  of cardizem PTA which did lower the BP by 48mmHg. Cardizem lowered heart rate to the 130s consistently. No hx of AFIB noted.

## 2016-03-14 NOTE — ED Notes (Signed)
Pt vomiting. Dr. Rogene Houston informed.

## 2016-03-14 NOTE — ED Provider Notes (Signed)
[facility] DEPT Provider Note   CSN: [id number] Arrival date & time: 03/14/16  1319     History   Chief Complaint Chief Complaint  Patient presents with  . Tachycardia    HPI Susan [name] is a 82 y.o. female.  Patient lives at [place]. Patient contacted the nurse for help due to feeling lightheaded and dizzy. Patient also had some substernal chest pain associated with nausea and perhaps one episode of vomiting. Evaluation showed that she had a tachycardia. According to EMS it was above 150. Patient received 10 mg of Cardizem exam in route. But her blood pressure started to drop. Upon arrival. Patient without any further chest pain upon arrival here. Patient's rhythm was consistent with atrial fibrillation with a heart rate in the 120s. Blood pressure was in the low 90s. Patient's blood pressure is never was documented below 90. Patient does not have a prior history of atrial fibrillation. Patient does have a prior history of reflux problems.      Past Medical History:  Diagnosis Date  . Aortic stenosis   . CARCINOMA, BLADDER, TRANSITIONAL CELL 10/24/2007  . HYPERTENSION 01/07/2007  . HYPOTHYROIDISM 01/07/2007  . IBS 05/03/2008  . MIXED HEARING LOSS BILATERAL 09/28/2009  . OSTEOARTHRITIS, KNEE 04/25/2007  . UNSPECIFIED ARTHROPATHY MULTIPLE SITES 10/26/2009    Patient Active Problem List   Diagnosis Date Noted  . Chest pain 03/14/2016  . Rectal bleeding 05/19/2014  . Hemorrhoid 05/19/2014  . Diarrhea 05/19/2014  . Esophageal reflux 05/19/2014  . Pure sensory lacunar syndrome 01/29/2013  . Anemia 11/10/2012  . Aortic stenosis 07/26/2011  . Bruit 07/26/2011  . MIXED HEARING LOSS BILATERAL 09/28/2009  . IBS 05/03/2008  . CARCINOMA, BLADDER, TRANSITIONAL CELL 10/24/2007  . Osteoarthrosis involving lower leg 04/25/2007  . Hypothyroidism 01/07/2007  . Essential hypertension 01/07/2007    Past Surgical History:  Procedure Laterality Date  . BELPHAROPTOSIS REPAIR      . BLADDER TUMOR EXCISION    . BUNIONECTOMY     bilateral  . CATARACT EXTRACTION     bilateral  . TONSILLECTOMY AND ADENOIDECTOMY     x2    OB History    No data available       Home Medications    Prior to Admission medications   Medication Sig Start Date End Date Taking? Authorizing Provider  acetaminophen (TYLENOL) 500 MG tablet Take 1,000 mg by mouth Strahan. And 1 at bedtime     Historical Provider, MD  aspirin 81 MG tablet Take 1 tablet (81 mg total) by mouth Sligh. 01/14/13   Marletta Lor, MD  atorvastatin (LIPITOR) 20 MG tablet TAKE 1 TABLET ONCE Ruffner. 10/31/15   Marletta Lor, MD  calcium carbonate (TUMS) 500 MG chewable tablet Chew 1 tablet by mouth as needed.      Historical Provider, MD  carvedilol (COREG) 12.5 MG tablet TAKE  (1)  TABLET TWICE A DAY WITH MEALS (BREAKFAST AND SUPPER) 03/07/15   Marletta Lor, MD  diphenoxylate-atropine (LOMOTIL) 2.5-0.025 MG per tablet TAKE 1 OR 2 TABLETS Hanf AS NEEDED FOR DIARRHEA OR LOOSE STOOLS. 10/15/14   Milus Banister, MD  famotidine (PEPCID) 20 MG tablet TAKE 1 TABLET TWICE Landenberger. 03/13/16   Milus Banister, MD  glucosamine-chondroitin 500-400 MG tablet Take 1 tablet by mouth 2 (two) times Desmarais.     Historical Provider, MD  hydrocortisone (PROCTOSOL HC) 2.5 % rectal cream Use the cream with applicator, insert small amount into the rectal area 2-3 times Dillon.  05/21/14   Laban Emperor Zehr, PA-C  levothyroxine (SYNTHROID, LEVOTHROID) 75 MCG tablet TAKE 1 TABLET EACH DAY. 11/21/15   Marletta Lor, MD  loperamide (IMODIUM A-D) 2 MG tablet Take 4 mg by mouth as needed.     Historical Provider, MD  Multiple Vitamin (MULTIVITAMIN) tablet Take 1 tablet by mouth Zachman.      Historical Provider, MD    Family History Family History  Problem Relation Age of Onset  . Stomach cancer Mother   . Arthritis Mother   . Leukemia Father   . Kidney failure Father   . Anuerysm Brother     brain  . ALS Sister   . Stomach cancer  Maternal Aunt   . Colon cancer Daughter   . Esophageal cancer Neg Hx   . Rectal cancer Neg Hx     Social History Social History  Substance Use Topics  . Smoking status: Former Smoker    Packs/day: 0.50    Years: 20.00    Types: Cigarettes    Quit date: 09/05/1973  . Smokeless tobacco: Never Used  . Alcohol use Yes     Comment: ocass     Allergies   Penicillins; Ace inhibitors; and Hydrocodone   Review of Systems Review of Systems  Constitutional: Negative for fever.  HENT: Negative for congestion.   Eyes: Negative for visual disturbance.  Respiratory: Negative for shortness of breath.   Cardiovascular: Positive for chest pain.  Gastrointestinal: Positive for nausea and vomiting.  Genitourinary: Negative for dysuria.  Musculoskeletal: Negative for back pain.  Skin: Negative for rash.  Neurological: Positive for dizziness and light-headedness.  Hematological: Does not bruise/bleed easily.  Psychiatric/Behavioral: Negative for confusion.     Physical Exam Updated Vital Signs BP 139/65   Pulse 92   Resp 22   SpO2 96%   Physical Exam  Constitutional: She is oriented to person, place, and time. She appears well-developed and well-nourished. No distress.  HENT:  Head: Normocephalic and atraumatic.  Eyes: Conjunctivae and EOM are normal. Pupils are equal, round, and reactive to light.  Neck: Normal range of motion. Neck supple.  Cardiovascular: Normal heart sounds.   Irregular and tachycardic  Pulmonary/Chest: Effort normal and breath sounds normal.  Abdominal: Soft. Bowel sounds are normal. There is no tenderness.  Musculoskeletal: Normal range of motion. She exhibits no edema.  Neurological: She is alert and oriented to person, place, and time. No cranial nerve deficit. She exhibits normal muscle tone. Coordination normal.  Skin: Skin is warm and dry.  Nursing note and vitals reviewed.    ED Treatments / Results  Labs (all labs ordered are listed, but only  abnormal results are displayed) Labs Reviewed  BASIC METABOLIC PANEL - Abnormal; Notable for the following:       Result Value   Glucose, Bld 128 (*)    All other components within normal limits  CBC WITH DIFFERENTIAL/PLATELET - Abnormal; Notable for the following:    WBC 12.0 (*)    Neutro Abs 9.9 (*)    All other components within normal limits  PROTIME-INR  I-STAT TROPOININ, ED   Results for orders placed or performed during the hospital encounter of [id number]  Basic metabolic panel  Result Value Ref Range   Sodium 142 135 - 145 mmol/L   Potassium 3.5 3.5 - 5.1 mmol/L   Chloride 109 101 - 111 mmol/L   CO2 25 22 - 32 mmol/L   Glucose, Bld 128 (H) 65 - 99 mg/dL  BUN 14 6 - 20 mg/dL   Creatinine, Ser 0.78 0.44 - 1.00 mg/dL   Calcium 9.1 8.9 - 10.3 mg/dL   GFR calc non Af Amer >60 >60 mL/min   GFR calc Af Amer >60 >60 mL/min   Anion gap 8 5 - 15  Protime-INR  Result Value Ref Range   Prothrombin Time 14.4 11.4 - 15.2 seconds   INR 1.11   CBC with Differential/Platelet  Result Value Ref Range   WBC 12.0 (H) 4.0 - 10.5 K/uL   RBC 4.26 3.87 - 5.11 MIL/uL   Hemoglobin 12.1 12.0 - 15.0 g/dL   HCT 39.2 36.0 - 46.0 %   MCV 92.0 78.0 - 100.0 fL   MCH 28.4 26.0 - 34.0 pg   MCHC 30.9 30.0 - 36.0 g/dL   RDW 15.2 11.5 - 15.5 %   Platelets 207 150 - 400 K/uL   Neutrophils Relative % 82 %   Neutro Abs 9.9 (H) 1.7 - 7.7 K/uL   Lymphocytes Relative 11 %   Lymphs Abs 1.3 0.7 - 4.0 K/uL   Monocytes Relative 6 %   Monocytes Absolute 0.8 0.1 - 1.0 K/uL   Eosinophils Relative 1 %   Eosinophils Absolute 0.1 0.0 - 0.7 K/uL   Basophils Relative 0 %   Basophils Absolute 0.0 0.0 - 0.1 K/uL  I-stat troponin, ED  Result Value Ref Range   Troponin i, poc 0.07 0.00 - 0.08 ng/mL   Comment 3             EKG  EKG Interpretation  Date/Time:  Wednesday March 14 2016 13:32:28 EDT Ventricular Rate:  137 PR Interval:    QRS Duration: 110 QT Interval:  294 QTC Calculation: 444 R  Axis:   -56 Text Interpretation:  Atrial fibrillation LVH with IVCD, LAD and secondary repol abnrm ST depression, probably rate related Anterior ST elevation, probably due to LVH Confirmed by Germani Gavilanes  MD, Kaleel Schmieder [phone]) on 03/14/2016 1:51:30 PM       Radiology Dg Chest Port 1 View  Result Date: 03/14/2016 CLINICAL DATA:  Chest pain and palpitations EXAM: PORTABLE CHEST 1 VIEW COMPARISON:  None. FINDINGS: There is moderate cardiac enlargement. There is aortic atherosclerosis. No pleural effusion or edema. No airspace opacification. IMPRESSION: 1. No acute findings. 2. Mild cardiac enlargement and aortic atherosclerosis. Electronically Signed   By: Kerby Moors M.D.   On: 03/14/2016 14:46    Procedures Procedures (including critical care time)  CRITICAL CARE Performed by: Fredia Sorrow Total critical care time: 30 minutes Critical care time was exclusive of separately billable procedures and treating other patients. Critical care was necessary to treat or prevent imminent or life-threatening deterioration. Critical care was time spent personally by me on the following activities: development of treatment plan with patient and/or surrogate as well as nursing, discussions with consultants, evaluation of patient's response to treatment, examination of patient, obtaining history from patient or surrogate, ordering and performing treatments and interventions, ordering and review of laboratory studies, ordering and review of radiographic studies, pulse oximetry and re-evaluation of patient's condition.   Medications Ordered in ED Medications  0.9 %  sodium chloride infusion ( Intravenous Stopped 03/14/16 1545)  sodium chloride 0.9 % bolus 250 mL (0 mLs Intravenous Stopped 03/14/16 1545)  diltiazem (CARDIZEM) 100 mg in dextrose 5 % 100 mL (1 mg/mL) infusion (0 mg/hr Intravenous Stopped 03/14/16 1545)     Initial Impression / Assessment and Plan / ED Course  I have reviewed the triage vital  signs  and the nursing notes.  Pertinent labs & imaging results that were available during my care of the patient were reviewed by me and considered in my medical decision making (see chart for details).  Clinical Course   Patient arrived with new onset atrial fibrillation. Heart rate was around 120s. Patient with hypotension. Patient did respond to fluids. Blood pressure came up. Patient started on Cardizem drip. About 5 minutes after starting the drip patient spontaneously converted back to normal sinus. Patient was given 10 mg bolus of cardia exam in route by EMS.   The patient with known prior history of atrial fibrillation. Patient's low blood pressure responded to fluids. Patient did however have chest pain and nausea and vomiting and dizziness. Dizziness seems to make sense in relation to the atrial fib. The chest pain seems to be concerning for possible acute cardiac event. Initial workup troponins negative EKG without any acute findings. Chest x-ray without evidence of pneumonia pulmonary edema or pneumothorax.   Patient will be admitted for chest pain rule out. To telemetry. Further evaluation of the new onset atrial fib as well.  Final Clinical Impressions(s) / ED Diagnoses   Final diagnoses:  Atrial fibrillation with RVR (HCC)  Chest pain, unspecified chest pain type    New Prescriptions New Prescriptions   No medications on file     Fredia Sorrow, MD 03/14/16 1626

## 2016-03-14 NOTE — H&P (Signed)
History and Physical    Susan [name] [id number] DOB: 1919-02-16 DOA: 03/14/2016  PCP: Nyoka Cowden, MD Patient coming from: [facility]  Chief Complaint: chest pain/lightheaded  HPI: Susan [name] is a delightful 82 y.o. female with medical history significant for hypothyroidism, hypertension, bladder cancer 2009 aortic stenosis presents to emergency Department chief complaint chest pain and lightheadedness. Initial evaluation reveals hypertensive urgency and A. fib with RVR.  Information is obtained from the patient and the daughter is at the bedside. Patient states he was in her usual state of health until today she states over to pick up something out of her purse which was on the floor when she stood up she felt dizzy. Associated symptoms include substernal chest pain described as a "pressure" as well as nausea with one episode of emesis. She denies coffee ground emesis. She denies headache syncope near-syncope. She denies palpitations shortness of breath diaphoresis lower extremity edema. She denies abdominal pain dysuria hematuria frequency or urgency. She denies diarrhea constipation melena or bright red blood per rectum. EMS was called reportedly her rhythm was consistent with atrial fibrillation. Patient does not have a history of atrial fibrillation.    ED Course: In the emergency department she's afebrile hemodynamically stable with a heart rate of 136. Cardizem drip was initiated and she quickly converted to sinus rhythm. He is afebrile not hypoxic  Review of Systems: As per HPI otherwise 10 point review of systems negative.   Ambulatory Status: She ambulates independently she had a fall 2 months ago without injury.  Past Medical History:  Diagnosis Date  . Aortic stenosis   . Atrial fibrillation, new onset ([place])   . CARCINOMA, BLADDER, TRANSITIONAL CELL 10/24/2007  . HYPERTENSION 01/07/2007  . HYPOTHYROIDISM 01/07/2007  . IBS 05/03/2008  .  MIXED HEARING LOSS BILATERAL 09/28/2009  . OSTEOARTHRITIS, KNEE 04/25/2007  . UNSPECIFIED ARTHROPATHY MULTIPLE SITES 10/26/2009    Past Surgical History:  Procedure Laterality Date  . BELPHAROPTOSIS REPAIR    . BLADDER TUMOR EXCISION    . BUNIONECTOMY     bilateral  . CATARACT EXTRACTION     bilateral  . TONSILLECTOMY AND ADENOIDECTOMY     x2    Social History   Social History  . Marital status: Widowed    Spouse name: N/A  . Number of children: 3  . Years of education: N/A   Occupational History  . retired Retired   Social History Main Topics  . Smoking status: Former Smoker    Packs/day: 0.50    Years: 20.00    Types: Cigarettes    Quit date: 09/05/1973  . Smokeless tobacco: Never Used  . Alcohol use Yes     Comment: ocass  . Drug use: No  . Sexual activity: Not on file   Other Topics Concern  . Not on file   Social History Narrative  . No narrative on file  She is a retired Radio producer. She lives in independent living at [facility].  Allergies  Allergen Reactions  . Penicillins Diarrhea    Has patient had a PCN reaction causing immediate rash, facial/tongue/throat swelling, SOB or lightheadedness with hypotension: YES Has patient had a PCN reaction causing severe rash involving mucus membranes or skin necrosis:NO Has patient had a PCN reaction that required hospitalization NO Has patient had a PCN reaction occurring within the last 10 years: NO If all of the above answers are "NO", then may proceed with Cephalosporin use.  . Ace Inhibitors  REACTION: cough  . Hydrocodone     REACTION: Hallucinations---minor    Family History  Problem Relation Age of Onset  . Stomach cancer Mother   . Arthritis Mother   . Leukemia Father   . Kidney failure Father   . Anuerysm Brother     brain  . ALS Sister   . Stomach cancer Maternal Aunt   . Colon cancer Daughter   . Esophageal cancer Neg Hx   . Rectal cancer Neg Hx     Prior to Admission medications    Medication Sig Start Date End Date Taking? Authorizing Provider  acetaminophen (TYLENOL) 500 MG tablet Take 1,000 mg by mouth every 6 (six) hours as needed for mild pain. And 1 at bedtime    Yes Historical Provider, MD  aspirin 81 MG tablet Take 1 tablet (81 mg total) by mouth Muhl. 01/14/13  Yes Marletta Lor, MD  atorvastatin (LIPITOR) 20 MG tablet TAKE 1 TABLET ONCE Cissell. 10/31/15  Yes Marletta Lor, MD  calcium carbonate (TUMS) 500 MG chewable tablet Chew 1 tablet by mouth as needed.     Yes Historical Provider, MD  carvedilol (COREG) 12.5 MG tablet TAKE  (1)  TABLET TWICE A DAY WITH MEALS (BREAKFAST AND SUPPER) 03/07/15  Yes Marletta Lor, MD  famotidine (PEPCID) 20 MG tablet TAKE 1 TABLET TWICE Delaguila. 03/13/16  Yes Milus Banister, MD  glucosamine-chondroitin 500-400 MG tablet Take 1 tablet by mouth Winningham as needed. For bones and joints   Yes Historical Provider, MD  hydrocortisone (PROCTOSOL HC) 2.5 % rectal cream Use the cream with applicator, insert small amount into the rectal area 2-3 times Kiser. Patient taking differently: Place 1 application rectally Chmiel as needed for hemorrhoids. Use the cream with applicator, insert small amount into the rectal area 2-3 times Barnett. 05/21/14  Yes Jessica D Zehr, PA-C  levothyroxine (SYNTHROID, LEVOTHROID) 75 MCG tablet TAKE 1 TABLET EACH DAY. 11/21/15  Yes Marletta Lor, MD  loperamide (IMODIUM A-D) 2 MG tablet Take 4 mg by mouth as needed.    Yes Historical Provider, MD  Multiple Vitamin (MULTIVITAMIN) tablet Take 2 tablets by mouth Spoelstra. Patient states she is taking 2 tabs a day   Yes Historical Provider, MD    Physical Exam: Vitals:   03/14/16 1530 03/14/16 1600 03/14/16 1615 03/14/16 1630  BP: 149/67 154/75 172/81 164/78  Pulse: 85 86 86 81  Resp: 16 22 24 12   SpO2: 95% 97% 97% 96%     General:  Appears calm and comfortable, slightly pale no acute distress, very hard of hearing Eyes:  PERRL, EOMI, normal lids,  iris ENT:  grossly normal hearing, lips & tongue, his membranes of her mouth are slightly pale and dry Neck:  no LAD, masses or thyromegaly Cardiovascular:  RRR, no m/r/g. No LE edema.  Respiratory:  CTA bilaterally, no w/r/r. Normal respiratory effort. Abdomen:  soft, ntnd, positive bowel sounds throughout no guarding or rebounding Skin:  no rash or induration seen on limited exam Musculoskeletal:  grossly normal tone BUE/BLE, good ROM, no bony abnormality Psychiatric:  grossly normal mood and affect, speech fluent and appropriate, AOx3 Neurologic:  CN 2-12 grossly intact, moves all extremities in coordinated fashion, sensation intact  Labs on Admission: I have personally reviewed following labs and imaging studies  CBC:  Recent Labs Lab 03/14/16 1310  WBC 12.0*  NEUTROABS 9.9*  HGB 12.1  HCT 39.2  MCV 92.0  PLT [id number]   Basic Metabolic Panel:  Recent Labs Lab 03/14/16 1310  NA 142  K 3.5  CL 109  CO2 25  GLUCOSE 128*  BUN 14  CREATININE 0.78  CALCIUM 9.1   GFR: CrCl cannot be calculated (Unknown ideal weight.). Liver Function Tests: No results for input(s): AST, ALT, ALKPHOS, BILITOT, PROT, ALBUMIN in the last 168 hours. No results for input(s): LIPASE, AMYLASE in the last 168 hours. No results for input(s): AMMONIA in the last 168 hours. Coagulation Profile:  Recent Labs Lab 03/14/16 1310  INR 1.11   Cardiac Enzymes: No results for input(s): CKTOTAL, CKMB, CKMBINDEX, TROPONINI in the last 168 hours. BNP (last 3 results) No results for input(s): PROBNP in the last 8760 hours. HbA1C: No results for input(s): HGBA1C in the last 72 hours. CBG: No results for input(s): GLUCAP in the last 168 hours. Lipid Profile: No results for input(s): CHOL, HDL, LDLCALC, TRIG, CHOLHDL, LDLDIRECT in the last 72 hours. Thyroid Function Tests: No results for input(s): TSH, T4TOTAL, FREET4, T3FREE, THYROIDAB in the last 72 hours. Anemia Panel: No results for input(s):  VITAMINB12, FOLATE, FERRITIN, TIBC, IRON, RETICCTPCT in the last 72 hours. Urine analysis: No results found for: COLORURINE, APPEARANCEUR, LABSPEC, PHURINE, GLUCOSEU, HGBUR, BILIRUBINUR, KETONESUR, PROTEINUR, UROBILINOGEN, NITRITE, LEUKOCYTESUR  Creatinine Clearance: CrCl cannot be calculated (Unknown ideal weight.).  Sepsis Labs: @LABRCNTIP (procalcitonin:4,lacticidven:4) )No results found for this or any previous visit (from the past 240 hour(s)).   Radiological Exams on Admission: Dg Chest Port 1 View  Result Date: 03/14/2016 CLINICAL DATA:  Chest pain and palpitations EXAM: PORTABLE CHEST 1 VIEW COMPARISON:  None. FINDINGS: There is moderate cardiac enlargement. There is aortic atherosclerosis. No pleural effusion or edema. No airspace opacification. IMPRESSION: 1. No acute findings. 2. Mild cardiac enlargement and aortic atherosclerosis. Electronically Signed   By: Kerby Moors M.D.   On: 03/14/2016 14:46    EKG: Independently reviewed. Sinus rhythm LVH with IVCD, LAD and secondary repol abnrm  Assessment/Plan Principal Problem:   Chest pain Active Problems:   Hypothyroidism   Essential hypertension   Esophageal reflux   Atrial fibrillation, new onset ([place])   #1. Chest pain. Some typical and atypical features. Heart score 5. EKG as noted above. Relieved after Cardizem given. History of GERD. Chest x-ray no acute findings mild cardiac enlargement and aortic atherosclerosis. Initial troponin negative. Pain-free on admission. -Admit to telemetry -Cycle troponin -lipid panel -Serial EKG -NTG as needed -Continue aspirin and statin -improved BP control -gi cocktail -OP follow up with cards  2. Atrial fibrillation new onset. Chadvasc score 4. Heart rate per EMS report 150. She was given 10 mg of Cardizem en route. The pressure became somewhat soft. Cardizem drip initiated 5 minutes later spontaneously converted to sinus rhythm. Home meds include coreg and she missed this am dose.  Suspect she goes in and out chronically  -Monitor on telemetry -Obtain TSH -continue coreg -OP follow up unless another episode during night  #3. Hypertension. Fair control. Distally blood pressure soft after Cardizem given. By the time of admission blood pressure trending up towards the high end of normal. Home medications include Coreg -Monitor closely -I Coreg with parameters  #4. GERD. Patient with history of same. Home medications include Pepcid. -GI cocktail -Continue Pepcid  5. Hypothyroidism. -Obtain a TSH -Continue home meds    DVT prophylaxis: scd Code Status: limited  Family Communication: daughter at bedside  Disposition Plan: back to [facility]  Consults called: none  Admission status: obs    Radene Gunning MD [facility]  If 7PM-7AM, please contact night-coverage [email or web address] Password [email or web address]  03/14/2016, 5:00 PM

## 2016-03-15 ENCOUNTER — Observation Stay (HOSPITAL_BASED_OUTPATIENT_CLINIC_OR_DEPARTMENT_OTHER): Payer: Medicare Other

## 2016-03-15 DIAGNOSIS — I350 Nonrheumatic aortic (valve) stenosis: Secondary | ICD-10-CM

## 2016-03-15 DIAGNOSIS — Z79899 Other long term (current) drug therapy: Secondary | ICD-10-CM | POA: Diagnosis not present

## 2016-03-15 DIAGNOSIS — I4891 Unspecified atrial fibrillation: Secondary | ICD-10-CM | POA: Diagnosis not present

## 2016-03-15 DIAGNOSIS — R0789 Other chest pain: Secondary | ICD-10-CM

## 2016-03-15 DIAGNOSIS — Z7982 Long term (current) use of aspirin: Secondary | ICD-10-CM | POA: Diagnosis not present

## 2016-03-15 DIAGNOSIS — H908 Mixed conductive and sensorineural hearing loss, unspecified: Secondary | ICD-10-CM | POA: Diagnosis not present

## 2016-03-15 DIAGNOSIS — E039 Hypothyroidism, unspecified: Secondary | ICD-10-CM | POA: Diagnosis not present

## 2016-03-15 DIAGNOSIS — Z87891 Personal history of nicotine dependence: Secondary | ICD-10-CM | POA: Diagnosis not present

## 2016-03-15 DIAGNOSIS — R072 Precordial pain: Secondary | ICD-10-CM | POA: Diagnosis not present

## 2016-03-15 DIAGNOSIS — R079 Chest pain, unspecified: Secondary | ICD-10-CM

## 2016-03-15 DIAGNOSIS — K219 Gastro-esophageal reflux disease without esophagitis: Secondary | ICD-10-CM | POA: Diagnosis not present

## 2016-03-15 DIAGNOSIS — I480 Paroxysmal atrial fibrillation: Secondary | ICD-10-CM | POA: Diagnosis not present

## 2016-03-15 DIAGNOSIS — I10 Essential (primary) hypertension: Secondary | ICD-10-CM | POA: Diagnosis not present

## 2016-03-15 DIAGNOSIS — E785 Hyperlipidemia, unspecified: Secondary | ICD-10-CM | POA: Diagnosis not present

## 2016-03-15 DIAGNOSIS — Z880 Allergy status to penicillin: Secondary | ICD-10-CM | POA: Diagnosis not present

## 2016-03-15 DIAGNOSIS — Z8551 Personal history of malignant neoplasm of bladder: Secondary | ICD-10-CM | POA: Diagnosis not present

## 2016-03-15 DIAGNOSIS — I959 Hypotension, unspecified: Secondary | ICD-10-CM | POA: Diagnosis not present

## 2016-03-15 DIAGNOSIS — R296 Repeated falls: Secondary | ICD-10-CM | POA: Diagnosis not present

## 2016-03-15 DIAGNOSIS — I160 Hypertensive urgency: Secondary | ICD-10-CM | POA: Diagnosis not present

## 2016-03-15 DIAGNOSIS — Z888 Allergy status to other drugs, medicaments and biological substances status: Secondary | ICD-10-CM | POA: Diagnosis not present

## 2016-03-15 DIAGNOSIS — Z885 Allergy status to narcotic agent status: Secondary | ICD-10-CM | POA: Diagnosis not present

## 2016-03-15 DIAGNOSIS — I248 Other forms of acute ischemic heart disease: Secondary | ICD-10-CM | POA: Diagnosis not present

## 2016-03-15 LAB — BASIC METABOLIC PANEL
ANION GAP: 7 (ref 5–15)
BUN: 13 mg/dL (ref 6–20)
CHLORIDE: 110 mmol/L (ref 101–111)
CO2: 27 mmol/L (ref 22–32)
CREATININE: 0.69 mg/dL (ref 0.44–1.00)
Calcium: 9 mg/dL (ref 8.9–10.3)
GFR calc non Af Amer: >60 mL/min (ref >60)
Glucose, Bld: 89 mg/dL (ref 65–99)
POTASSIUM: 3.6 mmol/L (ref 3.5–5.1)
Sodium: 144 mmol/L (ref 135–145)

## 2016-03-15 LAB — LIPID PANEL
CHOL/HDL RATIO: 1.9 ratio
Cholesterol: 96 mg/dL (ref 0–200)
HDL: 51 mg/dL (ref >40)
LDL CALC: 38 mg/dL (ref 0–99)
Triglycerides: 37 mg/dL (ref <150)
VLDL: 7 mg/dL (ref 0–40)

## 2016-03-15 LAB — CBC
HEMATOCRIT: 34 % — AB (ref 36.0–46.0)
HEMOGLOBIN: 10.5 g/dL — AB (ref 12.0–15.0)
MCH: 28.2 pg (ref 26.0–34.0)
MCHC: 30.9 g/dL (ref 30.0–36.0)
MCV: 91.4 fL (ref 78.0–100.0)
Platelets: 197 10*3/uL (ref 150–400)
RBC: 3.72 MIL/uL — AB (ref 3.87–5.11)
RDW: 15.5 % (ref 11.5–15.5)
WBC: 9.6 10*3/uL (ref 4.0–10.5)

## 2016-03-15 LAB — ECHOCARDIOGRAM COMPLETE
Height: 61 in
Weight: 2550.4 oz

## 2016-03-15 LAB — HEPARIN LEVEL (UNFRACTIONATED)
Heparin Unfractionated: 0.73 IU/mL — ABNORMAL HIGH (ref 0.30–0.70)
Heparin Unfractionated: 0.5 IU/mL (ref 0.30–0.70)

## 2016-03-15 LAB — TROPONIN I: Troponin I: 1.01 ng/mL (ref <0.03)

## 2016-03-15 MED ORDER — LEVOTHYROXINE SODIUM 75 MCG PO TABS
37.5000 ug | ORAL_TABLET | Freq: Every day | ORAL | Status: DC
  Administered 2016-03-15 – 2016-03-16 (×2): 37.5 ug via ORAL
  Filled 2016-03-15: qty 1

## 2016-03-15 NOTE — Consult Note (Signed)
CARDIOLOGY CONSULT NOTE       Patient ID: Susan [name] MRN: [id number] DOB/AGE: 80-Nov-1920 80 y.o.  Admit date: 03/14/2016 Referring Physician:  Broadus John Primary Physician: Nyoka Cowden, MD Primary Cardiologist: Percival Spanish Reason for Consultation:  Afib/ Chest Pain   Principal Problem:   Chest pain Active Problems:   Hypothyroidism   Essential hypertension   Esophageal reflux   Atrial fibrillation, new onset ([place])   HPI:   82 y.o. last seen by Dr Percival Spanish 02/09/16.  History of HTN and AS last echo 2013 with mean gradient 38 mmHg.  Chronic issues with dizziness some postural component Getting Worse with 3 falls the last 6 months No history of CAD. Admitted with short lived episode of rapid symptomatic afib now converted. Discussed with daughter and patient due to age and falls she is not an ideal anticoagulation candidate  This patients CHA2DS2-VASc Score and unadjusted Ischemic Stroke Rate (% per year) is equal to 4.8 % stroke rate/year from a score of 4  Above score calculated as 1 point each if present [CHF, HTN, DM, Vascular=MI/PAD/Aortic Plaque, Age if 74-74, or Female] Above score calculated as 2 points each if present [Age > 75, or Stroke/TIA/TE]   ROS All other systems reviewed and negative except as noted above  Past Medical History:  Diagnosis Date  . Aortic stenosis   . Atrial fibrillation, new onset ([place])   . CARCINOMA, BLADDER, TRANSITIONAL CELL 10/24/2007  . GERD (gastroesophageal reflux disease)   . Heart murmur   . HYPERTENSION 01/07/2007  . HYPOTHYROIDISM 01/07/2007  . IBS 05/03/2008  . MIXED HEARING LOSS BILATERAL 09/28/2009  . OSTEOARTHRITIS, KNEE 04/25/2007  . UNSPECIFIED ARTHROPATHY MULTIPLE SITES 10/26/2009    Family History  Problem Relation Age of Onset  . Stomach cancer Mother   . Arthritis Mother   . Leukemia Father   . Kidney failure Father   . Anuerysm Brother     brain  . ALS Sister   . Stomach cancer Maternal Aunt   . Colon  cancer Daughter   . Esophageal cancer Neg Hx   . Rectal cancer Neg Hx     Social History   Social History  . Marital status: Widowed    Spouse name: N/A  . Number of children: 3  . Years of education: N/A   Occupational History  . retired Retired   Social History Main Topics  . Smoking status: Former Smoker    Packs/day: 0.50    Years: 20.00    Types: Cigarettes    Quit date: 09/05/1973  . Smokeless tobacco: Never Used  . Alcohol use Yes     Comment: ocass  . Drug use: No  . Sexual activity: Not on file   Other Topics Concern  . Not on file   Social History Narrative  . No narrative on file    Past Surgical History:  Procedure Laterality Date  . BELPHAROPTOSIS REPAIR    . BLADDER TUMOR EXCISION    . BUNIONECTOMY     bilateral  . CATARACT EXTRACTION     bilateral  . TONSILLECTOMY AND ADENOIDECTOMY     x2     . aspirin  81 mg Oral Avans  . atorvastatin  20 mg Oral Verdone  . carvedilol  12.5 mg Oral BID WC  . famotidine  20 mg Oral BID  . levothyroxine  37.5 mcg Oral QAC breakfast   . heparin 800 Units/hr (03/15/16 0859)    Physical Exam: Blood pressure (!) 162/67, pulse  66, temperature 98.4 F (36.9 C), temperature source Oral, resp. rate 20, height 5\' 1"  (1.549 m), weight 72.3 kg (159 lb 6.4 oz), SpO2 96 %.    Affect appropriate Pale elderly female  HEENT: normal Neck supple with no adenopathy JVP normal no bruits no thyromegaly Lungs clear with no wheezing and good diaphragmatic motion Heart:  S1/S2 muffled but present severe AS  murmur, no rub, gallop or click PMI normal Abdomen: benighn, BS positve, no tenderness, no AAA no bruit.  No HSM or HJR Distal pulses intact with no bruits No edema Neuro non-focal Skin warm and dry No muscular weakness   Labs:   Lab Results  Component Value Date   WBC 9.6 03/15/2016   HGB 10.5 (L) 03/15/2016   HCT 34.0 (L) 03/15/2016   MCV 91.4 03/15/2016   PLT 197 03/15/2016    Recent Labs Lab  03/15/16 0303  NA 144  K 3.6  CL 110  CO2 27  BUN 13  CREATININE 0.69  CALCIUM 9.0  GLUCOSE 89   Lab Results  Component Value Date   TROPONINI 1.01 (HH) 03/15/2016    Lab Results  Component Value Date   CHOL 96 03/15/2016   CHOL 102 01/29/2014   Lab Results  Component Value Date   HDL 51 03/15/2016   HDL 55.40 01/29/2014   Lab Results  Component Value Date   LDLCALC 38 03/15/2016   LDLCALC 25 01/29/2014   Lab Results  Component Value Date   TRIG 37 03/15/2016   TRIG 109.0 01/29/2014   Lab Results  Component Value Date   CHOLHDL 1.9 03/15/2016   CHOLHDL 2 01/29/2014   No results found for: LDLDIRECT    Radiology: Dg Chest Port 1 View  Result Date: 03/14/2016 CLINICAL DATA:  Chest pain and palpitations EXAM: PORTABLE CHEST 1 VIEW COMPARISON:  None. FINDINGS: There is moderate cardiac enlargement. There is aortic atherosclerosis. No pleural effusion or edema. No airspace opacification. IMPRESSION: 1. No acute findings. 2. Mild cardiac enlargement and aortic atherosclerosis. Electronically Signed   By: Kerby Moors M.D.   On: 03/14/2016 14:46    EKG: rapid afib LVH with strain    ASSESSMENT AND PLAN:  PAF:  No anticoagulation given age, anemia and frequent falls. Continue coreg This is also in setting of over replacement of synthroid with suppressed TSH.  AS:  Likely severe and may be contributing to chest pain and dizzy spells f/u echo  Even TAVR would be a stretch for her  Thyroid:  Decrease replacement dose ? 50 ug Waner and f/u TSH 2 months   Chol:  On statin   HTN:  Well controlled.  Continue current medications and low sodium Dash type diet.   Will check postural vitals given dizziness    Signed: Jenkins Rouge 03/15/2016, 12:04 PM

## 2016-03-15 NOTE — Progress Notes (Addendum)
Follow-up:  Notified by RN regarding elevated troponin's. Pt remains in NSR and denies CP. VSS.  Assessment/Plan:  1. Elevated troponin: Demand ischemia in setting of new onset a-Fib w/ RVR vs NSTEMI. Discussed pt w/ Dr Wynonia Lawman w/ cardiology service who agreed w/ IV heparin tonight and echo in am. Will repeat EKG in am. Will continue to monitor closely on telemetry w/ low threshold to re-consult cardiology if pt's status changes.   Jeryl Columbia, NP-C [facility] Pager [phone]

## 2016-03-15 NOTE — Progress Notes (Signed)
ANTICOAGULATION CONSULT NOTE - Follow Up Consult  Pharmacy Consult for Heparin Indication: NSTEMI vs afib  Allergies  Allergen Reactions  . Penicillins Diarrhea    Has patient had a PCN reaction causing immediate rash, facial/tongue/throat swelling, SOB or lightheadedness with hypotension: YES Has patient had a PCN reaction causing severe rash involving mucus membranes or skin necrosis:NO Has patient had a PCN reaction that required hospitalization NO Has patient had a PCN reaction occurring within the last 10 years: NO If all of the above answers are "NO", then may proceed with Cephalosporin use.  . Ace Inhibitors     REACTION: cough  . Hydrocodone     REACTION: Hallucinations---minor    Patient Measurements: Height: 5\' 1"  (154.9 cm) Weight: 159 lb 6.4 oz (72.3 kg) IBW/kg (Calculated) : 47.8 Heparin Dosing Weight:  67 kg  Vital Signs: Temp: 97.9 F (36.6 C) (09/07 1500) Temp Source: Oral (09/07 1500) BP: 162/67 (09/07 1000) Pulse Rate: 66 (09/07 1000)  Labs:  Recent Labs  03/14/16 1310 03/14/16 1823 03/14/16 2205 03/15/16 0303 03/15/16 0523 03/15/16 0802 03/15/16 1643  HGB 12.1  --   --  10.5*  --   --   --   HCT 39.2  --   --  34.0*  --   --   --   PLT 207  --   --  197  --   --   --   LABPROT 14.4  --   --   --   --   --   --   INR 1.11  --   --   --   --   --   --   HEPARINUNFRC  --   --   --   --   --  0.73* 0.50  CREATININE 0.78  --   --  0.69  --   --   --   TROPONINI  --  0.68* 1.08*  --  1.01*  --   --     Estimated Creatinine Clearance: 39.1 mL/min (by C-G formula based on SCr of 0.8 mg/dL).   Assessment: Heparin for NSTEMI? and afib. CHA2DS2-VASc 5. CBC WNL on admission.  Heparin level therapeutic at 0.50 after rate decreased to 800 units/hr earlier today.   Goal of Therapy:  Heparin level 0.3-0.7 units/ml Monitor platelets by anticoagulation protocol: Yes   Plan:  1. Continue heparin infusion at 800 units/hr  2. Heparin level in morning to  serve as confirmatory 3. CBC in am  4. Follow up cardiology plans   Vincenza Hews, PharmD, BCPS 03/15/2016, 5:46 PM Pager: [phone]

## 2016-03-15 NOTE — Progress Notes (Signed)
ANTICOAGULATION CONSULT NOTE - Follow Up Consult  Pharmacy Consult for Heparin Indication: NSTEMI vs afib  Allergies  Allergen Reactions  . Penicillins Diarrhea    Has patient had a PCN reaction causing immediate rash, facial/tongue/throat swelling, SOB or lightheadedness with hypotension: YES Has patient had a PCN reaction causing severe rash involving mucus membranes or skin necrosis:NO Has patient had a PCN reaction that required hospitalization NO Has patient had a PCN reaction occurring within the last 10 years: NO If all of the above answers are "NO", then may proceed with Cephalosporin use.  . Ace Inhibitors     REACTION: cough  . Hydrocodone     REACTION: Hallucinations---minor    Patient Measurements: Height: 5\' 1"  (154.9 cm) Weight: 159 lb 6.4 oz (72.3 kg) IBW/kg (Calculated) : 47.8 Heparin Dosing Weight:  67 kg  Vital Signs: Temp: 98.2 F (36.8 C) (09/07 0500) Temp Source: Oral (09/07 0500) BP: 174/80 (09/07 0500) Pulse Rate: 75 (09/07 0500)  Labs:  Recent Labs  03/14/16 1310 03/14/16 1823 03/14/16 2205 03/15/16 0303 03/15/16 0523 03/15/16 0802  HGB 12.1  --   --  10.5*  --   --   HCT 39.2  --   --  34.0*  --   --   PLT 207  --   --  197  --   --   LABPROT 14.4  --   --   --   --   --   INR 1.11  --   --   --   --   --   HEPARINUNFRC  --   --   --   --   --  0.73*  CREATININE 0.78  --   --  0.69  --   --   TROPONINI  --  0.68* 1.08*  --  1.01*  --     Estimated Creatinine Clearance: 39.1 mL/min (by C-G formula based on SCr of 0.8 mg/dL).   Assessment:  Anticoagulation: Heparin for NSTEMI? and afib. CHA2DS2-VASc 5. CBC WNL on admission. Heparin level: 0.73 slightly high CBC: Hgb 12.1>10.5!  Goal of Therapy:  Heparin level 0.3-0.7 units/ml Monitor platelets by anticoagulation protocol: Yes   Plan:  Decrease IV heparin to 800 units/hr.  Recheck heparin level in 6 hrs if continues. Foos HL and CBC Echo this AM   Susan [name] S. Alford Highland,  PharmD, BCPS Clinical Staff Pharmacist Pager [phone]  Susan [name] 03/15/2016,8:58 AM

## 2016-03-15 NOTE — Care Management Obs Status (Signed)
[facility] NOTIFICATION   Patient Details  Name: Susan [name] MRN: [id number] Date of Birth: 30-Sep-1918   Medicare Observation Status Notification Given:  Yes    Bethena Roys, RN 03/15/2016, 5:19 PM

## 2016-03-15 NOTE — Progress Notes (Signed)
  Echocardiogram 2D Echocardiogram has been performed.  Susan [name] 03/15/2016, 2:24 PM

## 2016-03-15 NOTE — Progress Notes (Signed)
Paged K Schorr NP that patient's troponin increased from 0.68 to 1.08. Patient denies chest pain. New orders received via EPIC. Will continue to monitor.

## 2016-03-15 NOTE — Progress Notes (Signed)
PROGRESS NOTE    Susan [name]  [id number] DOB: October 01, 1918 DOA: 03/14/2016 PCP: Nyoka Cowden, MD  Brief Narrative: Susan [name] is a delightful 82 y.o. female with medical history significant for hypothyroidism, hypertension, bladder cancer 2009 aortic stenosis presents to emergency Department chief complaint chest pain and lightheadedness. Initial evaluation revealed hypertensive urgency and A. fib with RVR. Information is obtained from the patient and the daughter is at the bedside. Patient states he was in her usual state of health until today she states over to pick up something out of her purse which was on the floor when she stood up she felt dizzy. Associated symptoms include substernal chest pain described as a "pressure" as well as nausea with one episode of emesis.  Assessment & Plan:     NSTEMI vs Demand ischemia -Cards consulted -continue coreg/heparin/ASA/Lipitor -FU ECHO -troponin up to 1.08    Afib/new onset with RVR -converted to NSR, CHADS2vasc score is 5 -check ECHO -TSH low will cut down synthroid dose -she is a considerable fall risk, await Cards input regarding anticoagulation    Hypothyroidism -TSH low -will cut down synthroid dose in half    Essential hypertension -BP trending up -on coreg, may need to add second agent, possibly ACE, monitor for now    Esophageal reflux -continue pepcid  DVT prophylaxis:Heparin Code Status:Partial code Family Communication:daughter at bedside Disposition Plan: Home pending workup  Consultants:   Cardiology  Procedures: ECHO    Subjective: Feels well, no further chest pain  Objective: Vitals:   03/14/16 1710 03/14/16 2008 03/14/16 2132 03/15/16 0500  BP: (!) 174/93 (!) 141/69 (!) 147/78 (!) 174/80  Pulse: 95  79 75  Resp: 17 15  18   Temp: 98.4 F (36.9 C)  98 F (36.7 C) 98.2 F (36.8 C)  TempSrc: Oral  Oral Oral  SpO2: 98%  95% 98%  Weight:    72.3 kg (159 lb 6.4 oz)  Height:     5\' 1"  (1.549 m)    Intake/Output Summary (Last 24 hours) at 03/15/16 0807 Last data filed at 03/15/16 0600  Gross per 24 hour  Intake           942.65 ml  Output              800 ml  Net           142.65 ml   Filed Weights   03/15/16 0500  Weight: 72.3 kg (159 lb 6.4 oz)    Examination:  General exam: Appears calm and comfortable, AAOx3  Respiratory system: Clear to auscultation. Respiratory effort normal. Cardiovascular system: S1 & S2 heard, RRR. No JVD, murmurs, rubs, gallops or clicks. No pedal edema. Gastrointestinal system: Abdomen is nondistended, soft and nontender. No organomegaly or masses felt. Normal bowel sounds heard. Central nervous system: Alert and oriented. No focal neurological deficits. Extremities: Symmetric 5 x 5 power. Skin: No rashes, lesions or ulcers Psychiatry: Judgement and insight appear normal. Mood & affect appropriate.     Data Reviewed: I have personally reviewed following labs and imaging studies  CBC:  Recent Labs Lab 03/14/16 1310 03/15/16 0303  WBC 12.0* 9.6  NEUTROABS 9.9*  --   HGB 12.1 10.5*  HCT 39.2 34.0*  MCV 92.0 91.4  PLT 207 [id number]   Basic Metabolic Panel:  Recent Labs Lab 03/14/16 1310 03/15/16 0303  NA 142 144  K 3.5 3.6  CL 109 110  CO2 25 27  GLUCOSE 128* 89  BUN 14 13  CREATININE 0.78 0.69  CALCIUM 9.1 9.0   GFR: Estimated Creatinine Clearance: 39.1 mL/min (by C-G formula based on SCr of 0.8 mg/dL). Liver Function Tests: No results for input(s): AST, ALT, ALKPHOS, BILITOT, PROT, ALBUMIN in the last 168 hours. No results for input(s): LIPASE, AMYLASE in the last 168 hours. No results for input(s): AMMONIA in the last 168 hours. Coagulation Profile:  Recent Labs Lab 03/14/16 1310  INR 1.11   Cardiac Enzymes:  Recent Labs Lab 03/14/16 1823 03/14/16 2205 03/15/16 0523  TROPONINI 0.68* 1.08* 1.01*   BNP (last 3 results) No results for input(s): PROBNP in the last 8760 hours. HbA1C: No results  for input(s): HGBA1C in the last 72 hours. CBG: No results for input(s): GLUCAP in the last 168 hours. Lipid Profile:  Recent Labs  03/15/16 0303  CHOL 96  HDL 51  LDLCALC 38  TRIG 37  CHOLHDL 1.9   Thyroid Function Tests:  Recent Labs  03/14/16 1823  TSH 0.180*   Anemia Panel: No results for input(s): VITAMINB12, FOLATE, FERRITIN, TIBC, IRON, RETICCTPCT in the last 72 hours. Urine analysis: No results found for: COLORURINE, APPEARANCEUR, LABSPEC, PHURINE, GLUCOSEU, HGBUR, BILIRUBINUR, KETONESUR, PROTEINUR, UROBILINOGEN, NITRITE, LEUKOCYTESUR Sepsis Labs: @LABRCNTIP (procalcitonin:4,lacticidven:4)  )No results found for this or any previous visit (from the past 240 hour(s)).       Radiology Studies: Dg Chest Port 1 View  Result Date: 03/14/2016 CLINICAL DATA:  Chest pain and palpitations EXAM: PORTABLE CHEST 1 VIEW COMPARISON:  None. FINDINGS: There is moderate cardiac enlargement. There is aortic atherosclerosis. No pleural effusion or edema. No airspace opacification. IMPRESSION: 1. No acute findings. 2. Mild cardiac enlargement and aortic atherosclerosis. Electronically Signed   By: Kerby Moors M.D.   On: 03/14/2016 14:46        Scheduled Meds: . aspirin  81 mg Oral Baltzell  . atorvastatin  20 mg Oral Udall  . carvedilol  12.5 mg Oral BID WC  . famotidine  20 mg Oral BID  . levothyroxine  75 mcg Oral QAC breakfast   Continuous Infusions: . heparin 900 Units/hr (03/15/16 0009)     LOS: 0 days    Time spent: 34min    Domenic Polite, MD [facility] Pager [phone]  If 7PM-7AM, please contact night-coverage [email or web address] Password [email or web address] 03/15/2016, 8:07 AM

## 2016-03-16 ENCOUNTER — Telehealth: Payer: Self-pay | Admitting: *Deleted

## 2016-03-16 DIAGNOSIS — R079 Chest pain, unspecified: Secondary | ICD-10-CM

## 2016-03-16 LAB — CBC
HEMATOCRIT: 35.3 % — AB (ref 36.0–46.0)
Hemoglobin: 10.8 g/dL — ABNORMAL LOW (ref 12.0–15.0)
MCH: 28.2 pg (ref 26.0–34.0)
MCHC: 30.6 g/dL (ref 30.0–36.0)
MCV: 92.2 fL (ref 78.0–100.0)
PLATELETS: 182 10*3/uL (ref 150–400)
RBC: 3.83 MIL/uL — ABNORMAL LOW (ref 3.87–5.11)
RDW: 15.3 % (ref 11.5–15.5)
WBC: 9.6 10*3/uL (ref 4.0–10.5)

## 2016-03-16 LAB — HEPARIN LEVEL (UNFRACTIONATED): HEPARIN UNFRACTIONATED: 0.4 [IU]/mL (ref 0.30–0.70)

## 2016-03-16 MED ORDER — LEVOTHYROXINE SODIUM 75 MCG PO TABS: 37.5000 ug | ORAL_TABLET | Freq: Every day | ORAL | 0 refills | Status: DC

## 2016-03-16 MED ORDER — LOPERAMIDE HCL 2 MG PO CAPS
4.0000 mg | ORAL_CAPSULE | Freq: Once | ORAL | Status: AC
  Administered 2016-03-16: 4 mg via ORAL
  Filled 2016-03-16: qty 2

## 2016-03-16 NOTE — Discharge Summary (Signed)
Physician Discharge Summary  Susan [name] [id number] DOB: [birth year] DOA: 03/14/2016  PCP: Nyoka Cowden, MD  Admit date: 03/14/2016 Discharge date: 03/16/2016  Time spent: 35 minutes  Recommendations for Outpatient Follow-up:  1. PCP in 1 week, please monitor BP at FU   Discharge Diagnoses:  Principal Problem:   Pain in the chest Active Problems:   Hypothyroidism   Essential hypertension   Esophageal reflux   Atrial fibrillation with RVR [facility])   Discharge Condition: stable  Diet recommendation: low sodium, heart healthy  Filed Weights   03/15/16 0500 03/16/16 0322 03/16/16 0732  Weight: 72.3 kg (159 lb 6.4 oz) 71.8 kg (158 lb 6.4 oz) 59.5 kg (131 lb 1.6 oz)    History of present illness:  Susan [name] is a delightful 82 y.o. female with medical history significant for hypothyroidism, hypertension, bladder cancer 2009 aortic stenosis presents to emergency Department chief complaint chest pain and lightheadedness. Initial evaluation revealed and A. fib with RVR  Hospital Course:     Afib/new onset with RVR -converted to NSR spontaneously, CHADS2vasc score is 5 -2D ECHO showed normal EF and wall motion with moderate AS -TSH low hence I cut down synthroid dose -she is a considerable fall risk, with recent h/o falls and balance issues as well as due to advanced age, and anemia too, Cardiology and myself after discussion with family felt that she wasn't a good anticoagulation candidate at this time.  Demand ischemia -due to Afib RVR -Cards consulted, treated with coreg/heparin/ASA/Lipitor -ECHO showed normal EF and wall motion, no further ischemic evaluation was pursued    Hypothyroidism -TSH low -cut down synthroid dose in half    Essential hypertension -on coreg, monitor BP at FU and consider adding another agent if BP stays up    Esophageal reflux -continue pepcid  Procedures:  2D ECHO: Normal EF and wall motion, moderate  AS  Consultations:  Cardiology  Discharge Exam: Vitals:   03/15/16 2108 03/16/16 0322  BP: (!) 159/72 (!) 160/77  Pulse: 73 76  Resp: 14 17  Temp: 98.1 F (36.7 C) 97.4 F (36.3 C)    General: AAOx3 Cardiovascular: [id number], systolic murmur Respiratory: CTAB  Discharge Instructions   Discharge Instructions    Diet - low sodium heart healthy    Complete by:  As directed   Increase activity slowly    Complete by:  As directed     Current Discharge Medication List    CONTINUE these medications which have CHANGED   Details  levothyroxine (SYNTHROID, LEVOTHROID) 75 MCG tablet Take 0.5 tablets (37.5 mcg total) by mouth Agner before breakfast. Qty: 30 tablet, Refills: 0      CONTINUE these medications which have NOT CHANGED   Details  acetaminophen (TYLENOL) 500 MG tablet Take 1,000 mg by mouth every 6 (six) hours as needed for mild pain. And 1 at bedtime     aspirin 81 MG tablet Take 1 tablet (81 mg total) by mouth Buffkin. Qty: 30 tablet    atorvastatin (LIPITOR) 20 MG tablet TAKE 1 TABLET ONCE Sturgill. Qty: 90 tablet, Refills: 1    calcium carbonate (TUMS) 500 MG chewable tablet Chew 1 tablet by mouth as needed.      carvedilol (COREG) 12.5 MG tablet TAKE  (1)  TABLET TWICE A DAY WITH MEALS (BREAKFAST AND SUPPER) Qty: 180 tablet, Refills: 3    famotidine (PEPCID) 20 MG tablet TAKE 1 TABLET TWICE Percival. Qty: 60 tablet, Refills: 0    glucosamine-chondroitin 500-400 MG tablet  Take 1 tablet by mouth Meinhart as needed. For bones and joints    hydrocortisone (PROCTOSOL HC) 2.5 % rectal cream Use the cream with applicator, insert small amount into the rectal area 2-3 times Aerts. Qty: 30 g, Refills: 1    loperamide (IMODIUM A-D) 2 MG tablet Take 4 mg by mouth as needed.     Multiple Vitamin (MULTIVITAMIN) tablet Take 2 tablets by mouth Derk. Patient states she is taking 2 tabs a day       Allergies  Allergen Reactions  . Penicillins Diarrhea    Has patient had a  PCN reaction causing immediate rash, facial/tongue/throat swelling, SOB or lightheadedness with hypotension: YES Has patient had a PCN reaction causing severe rash involving mucus membranes or skin necrosis:NO Has patient had a PCN reaction that required hospitalization NO Has patient had a PCN reaction occurring within the last 10 years: NO If all of the above answers are "NO", then may proceed with Cephalosporin use.  . Ace Inhibitors     REACTION: cough  . Hydrocodone     REACTION: Hallucinations---minor   Follow-up Information    Nyoka Cowden, MD. Schedule an appointment as soon as possible for a visit in 1 week(s).   Specialty:  Internal Medicine Contact information: [address] [place] [address] [phone]            The results of significant diagnostics from this hospitalization (including imaging, microbiology, ancillary and laboratory) are listed below for reference.    Significant Diagnostic Studies: Dg Chest Port 1 View  Result Date: 03/14/2016 CLINICAL DATA:  Chest pain and palpitations EXAM: PORTABLE CHEST 1 VIEW COMPARISON:  None. FINDINGS: There is moderate cardiac enlargement. There is aortic atherosclerosis. No pleural effusion or edema. No airspace opacification. IMPRESSION: 1. No acute findings. 2. Mild cardiac enlargement and aortic atherosclerosis. Electronically Signed   By: Kerby Moors M.D.   On: 03/14/2016 14:46    Microbiology: No results found for this or any previous visit (from the past 240 hour(s)).   Labs: Basic Metabolic Panel:  Recent Labs Lab 03/14/16 1310 03/15/16 0303  NA 142 144  K 3.5 3.6  CL 109 110  CO2 25 27  GLUCOSE 128* 89  BUN 14 13  CREATININE 0.78 0.69  CALCIUM 9.1 9.0   Liver Function Tests: No results for input(s): AST, ALT, ALKPHOS, BILITOT, PROT, ALBUMIN in the last 168 hours. No results for input(s): LIPASE, AMYLASE in the last 168 hours. No results for input(s): AMMONIA in the last  168 hours. CBC:  Recent Labs Lab 03/14/16 1310 03/15/16 0303 03/16/16 0532  WBC 12.0* 9.6 9.6  NEUTROABS 9.9*  --   --   HGB 12.1 10.5* 10.8*  HCT 39.2 34.0* 35.3*  MCV 92.0 91.4 92.2  PLT 207 197 182   Cardiac Enzymes:  Recent Labs Lab 03/14/16 1823 03/14/16 2205 03/15/16 0523  TROPONINI 0.68* 1.08* 1.01*   BNP: BNP (last 3 results) No results for input(s): BNP in the last 8760 hours.  ProBNP (last 3 results) No results for input(s): PROBNP in the last 8760 hours.  CBG: No results for input(s): GLUCAP in the last 168 hours.     SignedDomenic Polite MD.  [facility] 03/16/2016, 11:25 AM

## 2016-03-16 NOTE — Progress Notes (Signed)
Report given to [employer] at [facility]

## 2016-03-16 NOTE — Progress Notes (Signed)
Discharge teaching and instructions reviewed with pt and pts family. Pt prescriptions given to daughter. Spoke with social worker Lorriane Shire, awaiting call from [facility] who are on their way to pick her up. No further questions at this time. VSS.

## 2016-03-16 NOTE — Telephone Encounter (Signed)
Received call from Thomes Dinning RN Nurse Manager for [place] [facility] at [facility]. Pt was discharged to the facility for Rehab and needs orders to admit pt. Asked him if he did not get orders from Hospital for Admission? Susan [name] said no just discharge instructions and normally the attending physician gives orders. Told him okay, verbal orders given per Dr. Raliegh Ip for Admit to [facility] for Rehab, Heart Healthy Diet, Medications per discharge summary and PT & OT evaluate and treat. Susan [name] verbalized understanding and said he will send orders over for Dr. Raliegh Ip to sign.Told him okay. Dr. Raliegh Ip aware orders are coming.

## 2016-03-16 NOTE — Evaluation (Signed)
Physical Therapy Evaluation Patient Details Name: Susan [name] MRN: [id number] DOB: 1919-01-16 Today's Date: 03/16/2016   History of Present Illness  82 yo female with onset of chest pain and elevated troponin was admitted, now is ready to dc to retirement community.  PMHx:  a-fib, hypothyroidism  Clinical Impression  Pt was seen for evaluation of mobility and decided SNF was best option.  However, her ins may not cover this and have talked with case manager who is checking to see what the options are.  Will follow acutely if she does not leave today to focus on hip strengthening and control of balance to progress independence with gait.    Follow Up Recommendations SNF    Equipment Recommendations  None recommended by PT    Recommendations for Other Services Rehab consult     Precautions / Restrictions Precautions Precautions: Fall (telemetry, afib history) Restrictions Weight Bearing Restrictions: No      Mobility  Bed Mobility Overal bed mobility: Needs Assistance Bed Mobility: Supine to Sit;Sit to Supine     Supine to sit: Min assist;Mod assist Sit to supine: Mod assist;Min assist   General bed mobility comments: cues 100% of the time for use of arms to assist  Transfers Overall transfer level: Needs assistance Equipment used: Rolling walker (2 wheeled);1 person hand held assist Transfers: Sit to/from [facility] Sit to Stand: Min assist Stand pivot transfers: Min assist       General transfer comment: 100 % cues for hand placement  Ambulation/Gait Ambulation/Gait assistance: Min assist;Min guard Ambulation Distance (Feet): 120 Feet Assistive device: Rolling walker (2 wheeled);1 person hand held assist Gait Pattern/deviations: Step-to pattern;Step-through pattern;Trunk flexed;Wide base of support;Decreased stride length Gait velocity: reduced Gait velocity interpretation: Below normal speed for age/gender General Gait Details: has hip  flexion contractures affecting posture and gait  Stairs            Wheelchair Mobility    Modified Rankin (Stroke Patients Only)       Balance Overall balance assessment: History of Falls;Needs assistance Sitting-balance support: Feet supported Sitting balance-Leahy Scale: Fair   Postural control: Posterior lean Standing balance support: Bilateral upper extremity supported Standing balance-Leahy Scale: Poor                               Pertinent Vitals/Pain Pain Assessment: No/denies pain    Home Living Family/patient expects to be discharged to:: Skilled nursing facility Living Arrangements: Alone               Additional Comments: [place] arrangement at [place]    Prior Function Level of Independence: Independent with assistive device(s)               Hand Dominance        Extremity/Trunk Assessment   Upper Extremity Assessment: Generalized weakness           Lower Extremity Assessment: Generalized weakness      Cervical / Trunk Assessment: Kyphotic  Communication   Communication: HOH  Cognition Arousal/Alertness: Awake/alert Behavior During Therapy: WFL for tasks assessed/performed Overall Cognitive Status: Within Functional Limits for tasks assessed                      General Comments General comments (skin integrity, edema, etc.): pt needs a hands-on control with all mobility and per case maanger may not be a SNF candidate due to limited time in hosp.  Daughters  will assist if needed at home and have HHPT or have pt go to outpatient therapy with her usual PT    Exercises        Assessment/Plan    PT Assessment Patient needs continued PT services  PT Diagnosis Difficulty walking   PT Problem List Decreased strength;Decreased range of motion;Decreased activity tolerance;Decreased balance;Decreased mobility;Decreased coordination;Decreased knowledge of use of DME;Decreased safety awareness;Cardiopulmonary  status limiting activity;Decreased skin integrity (IV site is seeping due to blood thinner)  PT Treatment Interventions DME instruction;Gait training;Functional mobility training;Therapeutic activities;Therapeutic exercise;Balance training;Neuromuscular re-education;Patient/family education   PT Goals (Current goals can be found in the Care Plan section) Acute Rehab PT Goals Patient Stated Goal: to get to walk PT Goal Formulation: With patient/family Time For Goal Achievement: 03/30/16 Potential to Achieve Goals: Good    Frequency Min 2X/week   Barriers to discharge Decreased caregiver support ([place] arrangement)      Co-evaluation               End of Session Equipment Utilized During Treatment: Gait belt Activity Tolerance: Patient tolerated treatment well;Patient limited by fatigue Patient left: in bed;with call bell/phone within reach;with nursing/sitter in room;with family/visitor present Nurse Communication: Mobility status         Time: 1209-1232 PT Time Calculation (min) (ACUTE ONLY): 23 min   Charges:   PT Evaluation $PT Eval Low Complexity: 1 Procedure PT Treatments $Gait Training: 8-22 mins   PT G Codes:        Ramond Dial Mar 29, 2016, 1:27 PM    Mee Hives, PT MS Acute Rehab Dept. Number: [place] [facility] [place]

## 2016-03-16 NOTE — Progress Notes (Signed)
Patient Name: Susan [name] Date of Encounter: 03/16/2016  Hospital Problem List     Principal Problem:   Chest pain Active Problems:   Hypothyroidism   Essential hypertension   Esophageal reflux   Atrial fibrillation, new onset (HCC)    Subjective   Feeling well this morning. No chest pain or dyspnea.   Inpatient Medications    . aspirin  81 mg Oral Celli  . atorvastatin  20 mg Oral Salberg  . carvedilol  12.5 mg Oral BID WC  . famotidine  20 mg Oral BID  . levothyroxine  37.5 mcg Oral QAC breakfast  . loperamide  4 mg Oral Once    Vital Signs    Vitals:   03/15/16 1804 03/15/16 2108 03/16/16 0322 03/16/16 0732  BP: (!) 168/66 (!) 159/72 (!) 160/77   Pulse: 78 73 76   Resp:  14 17   Temp:  98.1 F (36.7 C) 97.4 F (36.3 C)   TempSrc:  Oral Oral   SpO2:  97% 94%   Weight:   158 lb 6.4 oz (71.8 kg) 131 lb 1.6 oz (59.5 kg)  Height:        Intake/Output Summary (Last 24 hours) at 03/16/16 0827 Last data filed at 03/15/16 2000  Gross per 24 hour  Intake              272 ml  Output                0 ml  Net              272 ml   Filed Weights   03/15/16 0500 03/16/16 0322 03/16/16 0732  Weight: 159 lb 6.4 oz (72.3 kg) 158 lb 6.4 oz (71.8 kg) 131 lb 1.6 oz (59.5 kg)    Physical Exam    General: Pleasant pale older female, NAD. Neuro: Alert and oriented X 3. Moves all extremities spontaneously. Psych: Normal affect. HEENT:  Normal, hearing aid  Neck: Supple without bruits or JVD. Lungs:  Resp regular and unlabored, CTA. Heart: RRR no s3, s4, 4/6 systolic murmur. Abdomen: Soft, non-tender, non-distended, BS + x 4.  Extremities: No clubbing, cyanosis or edema. DP/PT/Radials 2+ and equal bilaterally.  Labs    CBC  Recent Labs  03/14/16 1310 03/15/16 0303 03/16/16 0532  WBC 12.0* 9.6 9.6  NEUTROABS 9.9*  --   --   HGB 12.1 10.5* 10.8*  HCT 39.2 34.0* 35.3*  MCV 92.0 91.4 92.2  PLT 207 197 [id number]   Basic Metabolic Panel  Recent Labs   03/14/16 1310 03/15/16 0303  NA 142 144  K 3.5 3.6  CL 109 110  CO2 25 27  GLUCOSE 128* 89  BUN 14 13  CREATININE 0.78 0.69  CALCIUM 9.1 9.0   Liver Function Tests No results for input(s): AST, ALT, ALKPHOS, BILITOT, PROT, ALBUMIN in the last 72 hours. No results for input(s): LIPASE, AMYLASE in the last 72 hours. Cardiac Enzymes  Recent Labs  03/14/16 1823 03/14/16 2205 03/15/16 0523  TROPONINI 0.68* 1.08* 1.01*   BNP Invalid input(s): POCBNP D-Dimer No results for input(s): DDIMER in the last 72 hours. Hemoglobin A1C No results for input(s): HGBA1C in the last 72 hours. Fasting Lipid Panel  Recent Labs  03/15/16 0303  CHOL 96  HDL 51  LDLCALC 38  TRIG 37  CHOLHDL 1.9   Thyroid Function Tests  Recent Labs  03/14/16 1823  TSH 0.180*    Telemetry  SR  ECG    SR, no ST/T wave abnormalities  Radiology     TTE: 03/15/16  Study Conclusions  - Left ventricle: The cavity size was normal. There was moderate   concentric hypertrophy. Systolic function was normal. The   estimated ejection fraction was in the range of 60% to 65%. Wall   motion was normal; there were no regional wall motion   abnormalities. Doppler parameters are consistent with abnormal   left ventricular relaxation (grade 1 diastolic dysfunction).   Doppler parameters are consistent with high ventricular filling   pressure. - Aortic valve: Valve mobility was restricted. There was moderate   stenosis. There was mild regurgitation. Peak velocity (S): 375   cm/s. Mean gradient (S): 31 mm Hg. Valve area (VTI): 1.13 cm^2.   Valve area (Vmax): 0.8 cm^2. Valve area (Vmean): 0.87 cm^2. - Mitral valve: Calcified annulus. Moderately thickened, moderately   calcified leaflets . There was mild regurgitation. Valve area by   continuity equation (using LVOT flow): 2.7 cm^2. - Left atrium: The atrium was severely dilated. - Right ventricle: The cavity size was normal. Wall thickness was    normal. Systolic function was normal. - Right atrium: The atrium was mildly dilated. - Tricuspid valve: There was moderate regurgitation. - Pulmonary arteries: Systolic pressure was moderately increased.   PA peak pressure: 51 mm Hg (S).  Assessment & Plan    82 yo female with PMH of HTN and AS who presented with A-fib RVR.   1. PAF: remains in SR. Continue with coreg dose. No anticoagulation at this time given age, anemia and falls. TSH was noted to be suppressed this admission and synthyroid was reduced from 31mcg to 37.70mcg.   2. AS: Hx of the same with echo in 2013 reading as moderate. Repeat echo this admission reads as moderate, with mild regurg, with mild MS, severely dilated left atrium and moderate TR. Orthostatics were negative.   3. HLD: on statin  4. HTN: Continue on current medications  Signed, Reino Bellis NP-C Pager [phone]  Suspect over replacement of synthroid contributes to PAF. Maint NSR Exam with moderate AS murmur and echo confirms not severe Ambulate and probably ok to d/c home  [employer]

## 2016-03-16 NOTE — Clinical Social Work Placement (Signed)
   CLINICAL SOCIAL WORK PLACEMENT  NOTE 03/16/16 - DISCHARGED TO [facility] RE[facility]  Date:  03/16/2016  Patient Details  Name: Susan [name] MRN: [id number] Date of Birth: 1918/07/14  Clinical Social Work is seeking post-discharge placement for this patient at the [place] ([facility] [employer] [facility] [employer] facility for rehab) level of care (*CSW will initial, date and re-position this form in  chart as items are completed):  No (Patient returning to her facility healthcare unit)   Patient/family provided with [place] Work Department's list of facilities offering this level of care within the geographic area requested by the patient (or if unable, by the patient's family).  Yes   Patient/family informed of their freedom to choose among providers that offer the needed level of care, that participate in Medicare, Medicaid or managed care program needed by the patient, have an available bed and are willing to accept the patient.  No   Patient/family informed of [place]'s ownership interest in [facility] and [facility], as well as of the fact that they are under no obligation to receive care at these facilities.  PASRR submitted to EDS on       PASRR number received on       Existing PASRR number confirmed on       FL2 transmitted to all facilities in geographic area requested by pt/family on 03/16/16     FL2 transmitted to all facilities within larger geographic area on       Patient informed that his/her managed care company has contracts with or will negotiate with certain facilities, including the following:        Yes (Patient advised that contact made with [facility] and they can take her for rehab)   Patient/family informed of bed offers received.  Patient chooses bed at [facility]     Physician recommends and patient chooses bed at      Patient to be transferred to [facility] on 03/16/16.  Patient to be transferred to  facility by Facility transportation     Patient family notified on 03/16/16 of transfer.  Name of family member notified:  Daughters Armed forces training and education officer and [name] at the bedside     PHYSICIAN       Additional Comment:    _______________________________________________ [name], LCSW 03/16/2016, 1:46 PM

## 2016-03-16 NOTE — NC FL2 (Signed)
[place] MEDICAID FL2 LEVEL OF CARE SCREENING TOOL     IDENTIFICATION  Patient Name: Susan [name] Birthdate: 05-Dec-1918 Sex: female Admission Date (Current Location): 03/14/2016  [facility] and [place] Number:  Herbalist and Address:  [facility] [place], [place] [address] [place], [place] [address]      Provider Number: [id number]  Attending Physician Name and Address:  Domenic Polite, MD  Relative Name and Phone Number:  [name] Daughter - [phone]     Current Level of Care: SNF Recommended Level of Care: [place] Prior Approval Number:    Date Approved/Denied:   PASRR Number: [id number] (Eff. 03/16/16)  Discharge Plan: SNF    Current Diagnoses: Patient Active Problem List   Diagnosis Date Noted  . Pain in the chest 03/14/2016  . Atrial fibrillation with RVR ([place])   . Rectal bleeding 05/19/2014  . Hemorrhoid 05/19/2014  . Diarrhea 05/19/2014  . Esophageal reflux 05/19/2014  . Pure sensory lacunar syndrome 01/29/2013  . Anemia 11/10/2012  . Aortic stenosis 07/26/2011  . Bruit 07/26/2011  . MIXED HEARING LOSS BILATERAL 09/28/2009  . IBS 05/03/2008  . CARCINOMA, BLADDER, TRANSITIONAL CELL 10/24/2007  . Osteoarthrosis involving lower leg 04/25/2007  . Hypothyroidism 01/07/2007  . Essential hypertension 01/07/2007    Orientation RESPIRATION BLADDER Height & Weight     Self, Time, Situation, Place  Normal Continent Weight: 131 lb 1.6 oz (59.5 kg) Height:  5\' 1"  (154.9 cm)  BEHAVIORAL SYMPTOMS/MOOD NEUROLOGICAL BOWEL NUTRITION STATUS      Continent Diet (Low sodium - Heart healthy)  AMBULATORY STATUS COMMUNICATION OF NEEDS Skin   Limited Assist Verbally Normal                       Personal Care Assistance Level of Assistance  Bathing, Feeding, Dressing Bathing Assistance: Limited assistance Feeding assistance: Independent Dressing Assistance: Limited assistance     Functional Limitations Info   Sight, Hearing, Speech Sight Info: Adequate Hearing Info: Impaired (Wears 2 hearing aids) Speech Info: Adequate    SPECIAL CARE FACTORS FREQUENCY   PT  Evaluated 9/8 and a minimum of 2X per week therapy recommended.                  Contractures Contractures Info: Not present    Additional Factors Info  Code Status, Allergies Code Status Info: Partial Allergies Info: Pencillins, Ace Inhibitors, Hydrocodone           Current Medications (03/16/2016):  This is the current hospital active medication list Current Facility-Administered Medications  Medication Dose Route Frequency Provider Last Rate Last Dose  . acetaminophen (TYLENOL) tablet 650 mg  650 mg Oral Q6H PRN Radene Gunning, NP       Or  . acetaminophen (TYLENOL) suppository 650 mg  650 mg Rectal Q6H PRN Radene Gunning, NP      . aspirin chewable tablet 81 mg  81 mg Oral Brooke Radene Gunning, NP   81 mg at 03/16/16 0827  . atorvastatin (LIPITOR) tablet 20 mg  20 mg Oral Singleton Radene Gunning, NP   20 mg at 03/16/16 0827  . calcium carbonate (TUMS - dosed in mg elemental calcium) chewable tablet 200 mg of elemental calcium  1 tablet Oral PRN Radene Gunning, NP      . carvedilol (COREG) tablet 12.5 mg  12.5 mg Oral BID WC Radene Gunning, NP   12.5 mg at 03/16/16 0829  .  famotidine (PEPCID) tablet 20 mg  20 mg Oral BID Radene Gunning, NP   20 mg at 03/16/16 0827  . levothyroxine (SYNTHROID, LEVOTHROID) tablet 37.5 mcg  37.5 mcg Oral QAC breakfast Domenic Polite, MD   37.5 mcg at 03/16/16 [id number]  . ondansetron (ZOFRAN) tablet 4 mg  4 mg Oral Q6H PRN Radene Gunning, NP       Or  . ondansetron [facility]) injection 4 mg  4 mg Intravenous Q6H PRN Radene Gunning, NP   4 mg at 03/14/16 [id number]     Discharge Medications: Please see discharge summary for a list of discharge medications.  Relevant Imaging Results:  Relevant Lab Results:   Additional Information ss#[id number]. DISCHARGE MEDICATIONS CONTINUE these medications which have  CHANGED   Details  levothyroxine (SYNTHROID, LEVOTHROID) 75 MCG tablet Take 0.5 tablets (37.5 mcg total) by mouth [place] before breakfast. Qty: 30 tablet, Refills: 0         CONTINUE these medications which have NOT CHANGED   Details  acetaminophen (TYLENOL) 500 MG tablet Take 1,000 mg by mouth every 6 (six) hours as needed for mild pain. And 1 at bedtime     aspirin 81 MG tablet Take 1 tablet (81 mg total) by mouth [place]. Qty: 30 tablet    atorvastatin (LIPITOR) 20 MG tablet TAKE 1 TABLET ONCE Prospero. Qty: 90 tablet, Refills: 1    calcium carbonate (TUMS) 500 MG chewable tablet Chew 1 tablet by mouth as needed.      carvedilol (COREG) 12.5 MG tablet TAKE  (1)  TABLET TWICE A DAY WITH MEALS (BREAKFAST AND SUPPER) Qty: 180 tablet, Refills: 3    famotidine (PEPCID) 20 MG tablet TAKE 1 TABLET TWICE [place]. Qty: 60 tablet, Refills: 0    glucosamine-chondroitin 500-400 MG tablet Take 1 tablet by mouth Reh as needed. For bones and joints    hydrocortisone (PROCTOSOL HC) 2.5 % rectal cream Use the cream with applicator, insert small amount into the rectal area 2-3 times Chuang. Qty: 30 g, Refills: 1    loperamide (IMODIUM A-D) 2 MG tablet Take 4 mg by mouth as needed.     Multiple Vitamin (MULTIVITAMIN) tablet Take 2 tablets by mouth [place]zer. Patient states she is taking 2 tabs a day         Sharlet [place]ina, Mila Homer, LCSW

## 2016-03-16 NOTE — Clinical Social Work Note (Signed)
Clinical Social Work Assessment  Patient Details  Name: Susan [name] MRN: [id number] Date of Birth: 1919/01/14  Date of referral:  03/16/16               Reason for consult:  Facility Placement                Permission sought to share information with:  Family Supports Permission granted to share information::  No (2 daughters in the room and patient allowed them to participate in the conversation.)  Name::     [name], [name] and [name]  Agency::     Relationship::  Daughters  Contact Information:  [name] (lives in [place])  [phone]  Housing/Transportation Living arrangements for the past 2 months:  [place] (Patient from [place] and will d/c to the skilled facility for rehab before returning home.) Source of Information:  Patient, Adult Children Patient Interpreter Needed:  None Criminal Activity/Legal Involvement Pertinent to Current Situation/Hospitalization:  No - Comment as needed Significant Relationships:  Adult Children, Other Family Members Lives with:  Facility Resident ([place]) Do you feel safe going back to the place where you live?  Yes (Patient feels safe in her home, but initiated request to go to [place] rehab before returning home.) Need for family participation in patient care:  Yes (Comment)  Care giving concerns:  Patient requested to d/c to [facility]'s rehab to help her get stronger before going home.  Social Worker assessment / plan:  CSW talked with patient and 2 of her daughters [name] and [name]) at the bedside regarding discharge planning. Patient was lying in bed and was alert, oriented, pleasant and open to talking with CSW. She expressed her desire in going to [facility]'s rehab and CSW explained the process in getting the facility what they need to work with her in rehab.   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  Patient/Family's Understanding of and Emotional Response to Diagnosis, Current Treatment, and Prognosis:  Not discussed.  Emotional Assessment Appearance:  Appears younger than stated age Attitude/Demeanor/Rapport:  Other (Appropriate) Affect (typically observed):  Appropriate, Pleasant Orientation:  Oriented to Self, Oriented to Place, Oriented to  Time, Oriented to Situation Alcohol / Substance use:  Tobacco Use, Alcohol Use, Illicit Drugs (Patient reports that she quit smoking, does drink alcohol and does not use illicit drugs) Psych involvement (Current and /or in the community):  No (Comment)  Discharge Needs  Concerns to be addressed:  Discharge Planning Concerns (Patient requested to d/c to her facility rehab program) Readmission within the last 30 days:  No Current discharge risk:  None Barriers to Discharge:  No Barriers Identified   [name], LCSW 03/16/2016, 1:36 PM

## 2016-03-20 ENCOUNTER — Encounter: Payer: Self-pay | Admitting: Internal Medicine

## 2016-03-20 ENCOUNTER — Non-Acute Institutional Stay (SKILLED_NURSING_FACILITY): Payer: Medicare Other | Admitting: Internal Medicine

## 2016-03-20 DIAGNOSIS — D649 Anemia, unspecified: Secondary | ICD-10-CM

## 2016-03-20 DIAGNOSIS — K582 Mixed irritable bowel syndrome: Secondary | ICD-10-CM | POA: Diagnosis not present

## 2016-03-20 DIAGNOSIS — R278 Other lack of coordination: Secondary | ICD-10-CM | POA: Diagnosis not present

## 2016-03-20 DIAGNOSIS — M6259 Muscle wasting and atrophy, not elsewhere classified, multiple sites: Secondary | ICD-10-CM | POA: Diagnosis not present

## 2016-03-20 DIAGNOSIS — E039 Hypothyroidism, unspecified: Secondary | ICD-10-CM

## 2016-03-20 DIAGNOSIS — R0789 Other chest pain: Secondary | ICD-10-CM | POA: Diagnosis not present

## 2016-03-20 DIAGNOSIS — I10 Essential (primary) hypertension: Secondary | ICD-10-CM | POA: Diagnosis not present

## 2016-03-20 DIAGNOSIS — I4891 Unspecified atrial fibrillation: Secondary | ICD-10-CM

## 2016-03-20 DIAGNOSIS — R531 Weakness: Secondary | ICD-10-CM | POA: Diagnosis not present

## 2016-03-20 DIAGNOSIS — M159 Polyosteoarthritis, unspecified: Secondary | ICD-10-CM

## 2016-03-20 DIAGNOSIS — I350 Nonrheumatic aortic (valve) stenosis: Secondary | ICD-10-CM

## 2016-03-20 DIAGNOSIS — M25561 Pain in right knee: Secondary | ICD-10-CM | POA: Diagnosis not present

## 2016-03-20 DIAGNOSIS — M25551 Pain in right hip: Secondary | ICD-10-CM | POA: Diagnosis not present

## 2016-03-20 DIAGNOSIS — R296 Repeated falls: Secondary | ICD-10-CM | POA: Diagnosis not present

## 2016-03-20 DIAGNOSIS — R2689 Other abnormalities of gait and mobility: Secondary | ICD-10-CM | POA: Diagnosis not present

## 2016-03-20 DIAGNOSIS — R55 Syncope and collapse: Secondary | ICD-10-CM | POA: Diagnosis not present

## 2016-03-20 DIAGNOSIS — H906 Mixed conductive and sensorineural hearing loss, bilateral: Secondary | ICD-10-CM | POA: Diagnosis not present

## 2016-03-20 NOTE — Progress Notes (Signed)
Patient ID: Susan [name], female   DOB: [birth year], 82 y.o.   MRN: [id number]  Provider:  Rexene Edison. Mariea Clonts, D.O., C.M.D. Location:   [facility] Room Number: [place] of Service:  SNF (31)  PCP: Nyoka Cowden, MD Patient Care Team: Marletta Lor, MD as PCP - General (Internal Medicine)  Extended Emergency Contact Information Primary Emergency Contact: Rema Fendt [place] of [place] Phone: [phone] Relation: Daughter  Code Status:  "full code" in rehab Goals of Care: Advanced Directive information Advanced Directives 03/20/2016  Does patient have an advance directive? Yes  Type of Paramedic of [place];Living will  Copy of advanced directive(s) in chart? Yes  Would patient like information on creating an advanced directive? -   Chief Complaint  Patient presents with  . New Admit To SNF    Rehab admission    HPI: Patient is a 82 y.o. female with h/o AS, transitional cell bladder ca, GERD, htn, hypothyroidism, IBS, hearing loss, and osteoarthritis of multiple joints especially knees seen today for admission to [facility] rehab s/p hospitalization with chest pain 9/6-03/16/16.  She presented with chest pain and lightheadedness.  She was found to have new onset afib with RVR with CHADS2vasc of 5.  Her echo showed moderate aortic stenosis and a normal EF.    Her TSH was low at 0.180 so her synthroid dose was cut in half from 75 to 37.93mcg.    Her bp was elevated at the hospital just before discharge.  She was sent with coreg alone with orders to monitor her BP to see if anything additional needed to be added.  Due to repeated falls recently including one significant one where she struck her head and injured her right shoulder, she and her daughter opted for her to take aspirin only and avoid coumadin and novel anticoagulants.    When seen, she was doing very well.  She was eager to go home and wanted to know what was  stopping that (needs PT, OT clearance).  She wanted to get her scooter over to the unit so she could get around better (uses it b/c of her bad OA).   She was otherwise feeling much better w/o any further chest pain or palpitations.  She is eating well, bowels are moving and she would prefer to sleep in her bed, but was doing ok with sleep here.  Past Medical History:  Diagnosis Date  . Aortic stenosis   . Atrial fibrillation, new onset ([place])   . CARCINOMA, BLADDER, TRANSITIONAL CELL 10/24/2007  . GERD (gastroesophageal reflux disease)   . Heart murmur   . HYPERTENSION 01/07/2007  . HYPOTHYROIDISM 01/07/2007  . IBS 05/03/2008  . MIXED HEARING LOSS BILATERAL 09/28/2009  . OSTEOARTHRITIS, KNEE 04/25/2007  . UNSPECIFIED ARTHROPATHY MULTIPLE SITES 10/26/2009   Past Surgical History:  Procedure Laterality Date  . BELPHAROPTOSIS REPAIR    . BLADDER TUMOR EXCISION    . BUNIONECTOMY     bilateral  . CATARACT EXTRACTION     bilateral  . TONSILLECTOMY AND ADENOIDECTOMY     x2    reports that she quit smoking about 42 years ago. Her smoking use included Cigarettes. She has a 10.00 pack-year smoking history. She has never used smokeless tobacco. She reports that she drinks alcohol. She reports that she does not use drugs. Social History   Social History  . Marital status: Widowed    Spouse name: N/A  . Number of children: 3  .  Years of education: N/A   Occupational History  . retired Retired   Social History Main Topics  . Smoking status: Former Smoker    Packs/day: 0.50    Years: 20.00    Types: Cigarettes    Quit date: 09/05/1973  . Smokeless tobacco: Never Used  . Alcohol use Yes     Comment: ocass  . Drug use: No  . Sexual activity: Not on file   Other Topics Concern  . Not on file   Social History Narrative  . No narrative on file    Functional Status Survey:    Family History  Problem Relation Age of Onset  . Stomach cancer Mother   . Arthritis Mother   . Leukemia  Father   . Kidney failure Father   . Anuerysm Brother     brain  . ALS Sister   . Stomach cancer Maternal Aunt   . Colon cancer Daughter   . Esophageal cancer Neg Hx   . Rectal cancer Neg Hx     Health Maintenance  Topic Date Due  . TETANUS/TDAP  03/31/1940  . DEXA SCAN  03/31/1986  . INFLUENZA VACCINE  02/07/2016  . ZOSTAVAX  Completed  . PNA vac Low Risk Adult  Completed    Allergies  Allergen Reactions  . Penicillins Diarrhea    Has patient had a PCN reaction causing immediate rash, facial/tongue/throat swelling, SOB or lightheadedness with hypotension: YES Has patient had a PCN reaction causing severe rash involving mucus membranes or skin necrosis:NO Has patient had a PCN reaction that required hospitalization NO Has patient had a PCN reaction occurring within the last 10 years: NO If all of the above answers are "NO", then may proceed with Cephalosporin use.  . Ace Inhibitors     REACTION: cough  . Hydrocodone     REACTION: Hallucinations---minor      Medication List       Accurate as of 03/20/16 10:45 AM. Always use your most recent med list.          acetaminophen 500 MG tablet Commonly known as:  TYLENOL Take 1,000 mg by mouth every 6 (six) hours as needed for mild pain. And 1 at bedtime   aspirin 81 MG tablet Take 1 tablet (81 mg total) by mouth Rodger.   atorvastatin 20 MG tablet Commonly known as:  LIPITOR TAKE 1 TABLET ONCE Carriker.   calcium carbonate 500 MG chewable tablet Commonly known as:  TUMS - dosed in mg elemental calcium Chew 1 tablet by mouth as needed.   carvedilol 12.5 MG tablet Commonly known as:  COREG TAKE  (1)  TABLET TWICE A DAY WITH MEALS (BREAKFAST AND SUPPER)   famotidine 20 MG tablet Commonly known as:  PEPCID TAKE 1 TABLET TWICE Utsey.   glucosamine-chondroitin 500-400 MG tablet Take 1 tablet by mouth Agne as needed. For bones and joints   hydrocortisone 2.5 % rectal cream Commonly known as:  ANUSOL-HC Place 1  application rectally 2 (two) times Lacour as needed.   levothyroxine 75 MCG tablet Commonly known as:  SYNTHROID, LEVOTHROID Take 0.5 tablets (37.5 mcg total) by mouth Roderick before breakfast.   loperamide 2 MG tablet Commonly known as:  IMODIUM A-D Take 4 mg by mouth as needed.   multivitamin tablet Take 2 tablets by mouth Antilla.       Review of Systems  Constitutional: Negative for activity change, appetite change, chills, fatigue, fever and unexpected weight change.  HENT: Positive for hearing loss.  Negative for congestion.   Eyes: Negative for visual disturbance.       Glasses  Respiratory: Negative for chest tightness, shortness of breath and wheezing.   Cardiovascular: Negative for chest pain, palpitations and leg swelling.  Gastrointestinal: Negative for abdominal distention, abdominal pain, blood in stool, constipation, diarrhea, nausea and vomiting.  Genitourinary: Negative for dysuria.  Musculoskeletal: Positive for arthralgias, gait problem and myalgias. Negative for back pain and joint swelling.  Skin: Negative for color change.  Neurological: Negative for dizziness, speech difficulty, weakness, light-headedness and numbness.  Hematological: Bruises/bleeds easily.  Psychiatric/Behavioral: Negative for agitation, behavioral problems and sleep disturbance. The patient is not nervous/anxious.     Vitals:   03/20/16 1020  BP: (!) 149/75  Pulse: 75  Resp: 17  Temp: 97 F (36.1 C)  TempSrc: Oral  SpO2: 97%  Weight: 157 lb (71.2 kg)   Body mass index is 29.66 kg/m. Physical Exam  Constitutional: She is oriented to person, place, and time. She appears well-developed and well-nourished.  HENT:  Head: Normocephalic and atraumatic.  Right Ear: External ear normal.  Left Ear: External ear normal.  Nose: Nose normal.  Mouth/Throat: Oropharynx is clear and moist. No oropharyngeal exudate.  Eyes: Conjunctivae and EOM are normal. Pupils are equal, round, and reactive to  light.  Neck: Neck supple. No JVD present. No tracheal deviation present. No thyromegaly present.  Cardiovascular: Intact distal pulses.   Murmur heard. irreg irreg; 3/6 systolic murmur heard throughout precordium and radiating to carotid  Pulmonary/Chest: Effort normal and breath sounds normal. No respiratory distress.  Abdominal: Soft. Bowel sounds are normal. She exhibits no distension. There is no tenderness.  Musculoskeletal: Normal range of motion. She exhibits tenderness.  Of right shoulder and bilateral knees; uses power scooter primarily to get around, can use walker also  Lymphadenopathy:    She has no cervical adenopathy.  Neurological: She is alert and oriented to person, place, and time.  Skin: Skin is warm and dry. There is pallor.  Psychiatric: She has a normal mood and affect. Her behavior is normal. Judgment and thought content normal.    Labs reviewed: Basic Metabolic Panel:  Recent Labs  02/06/16 1441 03/14/16 1310 03/15/16 0303  NA 144 142 144  K 3.7 3.5 3.6  CL 107 109 110  CO2 28 25 27   GLUCOSE 133* 128* 89  BUN 14 14 13   CREATININE 0.74 0.78 0.69  CALCIUM 9.5 9.1 9.0   Liver Function Tests:  Recent Labs  02/06/16 1441  AST 14  ALT 11  ALKPHOS 78  BILITOT 1.0  PROT 6.2  ALBUMIN 3.9   No results for input(s): LIPASE, AMYLASE in the last 8760 hours. No results for input(s): AMMONIA in the last 8760 hours. CBC:  Recent Labs  02/06/16 1441 03/14/16 1310 03/15/16 0303 03/16/16 0532  WBC 8.8 12.0* 9.6 9.6  NEUTROABS 5.5 9.9*  --   --   HGB 11.5* 12.1 10.5* 10.8*  HCT 34.9* 39.2 34.0* 35.3*  MCV 86.4 92.0 91.4 92.2  PLT 229.0 207 197 182   Cardiac Enzymes:  Recent Labs  03/14/16 1823 03/14/16 2205 03/15/16 0523  TROPONINI 0.68* 1.08* 1.01*   BNP: Invalid input(s): POCBNP No results found for: HGBA1C Lab Results  Component Value Date   TSH 0.180 (L) 03/14/2016   No results found for: VITAMINB12 No results found for:  FOLATE No results found for: IRON, TIBC, FERRITIN  Imaging and Procedures obtained prior to SNF admission: Dg Chest [facility]  Result Date: 03/14/2016 CLINICAL DATA:  Chest pain and palpitations EXAM: PORTABLE CHEST 1 VIEW COMPARISON:  None. FINDINGS: There is moderate cardiac enlargement. There is aortic atherosclerosis. No pleural effusion or edema. No airspace opacification. IMPRESSION: 1. No acute findings. 2. Mild cardiac enlargement and aortic atherosclerosis. Electronically Signed   By: Kerby Moors M.D.   On: 03/14/2016 14:46   Assessment/Plan 1. Atrial fibrillation with RVR ([place]) -continue on aspirin 81 mg po Leisey and coreg 12.5mg  po bid - no longer having symptoms and rate is controlled - due to frequent falls recently, she and her daughter opted for asa only   2. Aortic stenosis -is moderate per echo during this hospital stay -cont to monitor, not felt to be cause of her chest pain (was felt to be due to the rapid afib)  3. Essential hypertension -bp was 149/75 today -orders written to notify us if BPs consistently over 150/90 so med can be added to coreg, HR 75  4. Irritable bowel syndrome with both constipation and diarrhea -has been longstanding for her -don't have all of Dr. Marthann Schiller records re: prior workup, etc. -she is using her imodium she takes at home (had not had it this am and took multiple trips to the restroom--3 or 4)  5. Hypothyroidism, unspecified hypothyroidism type -TSH was low and hospital decreased dose by half -needs f/u tsh in 6 wks with PCP  6. MIXED HEARING LOSS BILATERAL -stable, ongoing despite hearing aide use  7. Other chest pain -resolved with resolution of rapid afib  8. Anemia, unspecified anemia type -chronic, stable, mild anemia, likely age-related -cont to monitor  9. Generalized OA -uses tylenol and glucosamine for pain -is reason for use of scooter to get around, also can use walker short distances -getting her PT, OT here and  doing well  10. Weakness generalized -here for PT, OT with goal to return to [place] apt when ok with therapy team   Family/ staff Communication: Discussed with family (pt's daughter was here).   Discussed with nursing staff.    Labs/tests ordered:  F/u tsh in 6 wks (can be done at PCP f/u)

## 2016-03-21 DIAGNOSIS — R296 Repeated falls: Secondary | ICD-10-CM | POA: Diagnosis not present

## 2016-03-21 DIAGNOSIS — M6259 Muscle wasting and atrophy, not elsewhere classified, multiple sites: Secondary | ICD-10-CM | POA: Diagnosis not present

## 2016-03-21 DIAGNOSIS — R2689 Other abnormalities of gait and mobility: Secondary | ICD-10-CM | POA: Diagnosis not present

## 2016-03-21 DIAGNOSIS — M25551 Pain in right hip: Secondary | ICD-10-CM | POA: Diagnosis not present

## 2016-03-21 DIAGNOSIS — M25561 Pain in right knee: Secondary | ICD-10-CM | POA: Diagnosis not present

## 2016-03-21 DIAGNOSIS — I4891 Unspecified atrial fibrillation: Secondary | ICD-10-CM | POA: Diagnosis not present

## 2016-03-22 DIAGNOSIS — R2689 Other abnormalities of gait and mobility: Secondary | ICD-10-CM | POA: Diagnosis not present

## 2016-03-22 DIAGNOSIS — R296 Repeated falls: Secondary | ICD-10-CM | POA: Diagnosis not present

## 2016-03-22 DIAGNOSIS — I4891 Unspecified atrial fibrillation: Secondary | ICD-10-CM | POA: Diagnosis not present

## 2016-03-22 DIAGNOSIS — M6259 Muscle wasting and atrophy, not elsewhere classified, multiple sites: Secondary | ICD-10-CM | POA: Diagnosis not present

## 2016-03-22 DIAGNOSIS — M25561 Pain in right knee: Secondary | ICD-10-CM | POA: Diagnosis not present

## 2016-03-22 DIAGNOSIS — M25551 Pain in right hip: Secondary | ICD-10-CM | POA: Diagnosis not present

## 2016-03-23 DIAGNOSIS — R296 Repeated falls: Secondary | ICD-10-CM | POA: Diagnosis not present

## 2016-03-23 DIAGNOSIS — R2689 Other abnormalities of gait and mobility: Secondary | ICD-10-CM | POA: Diagnosis not present

## 2016-03-23 DIAGNOSIS — M25551 Pain in right hip: Secondary | ICD-10-CM | POA: Diagnosis not present

## 2016-03-23 DIAGNOSIS — I4891 Unspecified atrial fibrillation: Secondary | ICD-10-CM | POA: Diagnosis not present

## 2016-03-23 DIAGNOSIS — R69 Illness, unspecified: Secondary | ICD-10-CM | POA: Diagnosis not present

## 2016-03-23 DIAGNOSIS — M6259 Muscle wasting and atrophy, not elsewhere classified, multiple sites: Secondary | ICD-10-CM | POA: Diagnosis not present

## 2016-03-23 DIAGNOSIS — M25561 Pain in right knee: Secondary | ICD-10-CM | POA: Diagnosis not present

## 2016-03-23 DIAGNOSIS — D649 Anemia, unspecified: Secondary | ICD-10-CM | POA: Diagnosis not present

## 2016-03-24 DIAGNOSIS — R69 Illness, unspecified: Secondary | ICD-10-CM | POA: Diagnosis not present

## 2016-03-25 DIAGNOSIS — M6259 Muscle wasting and atrophy, not elsewhere classified, multiple sites: Secondary | ICD-10-CM | POA: Diagnosis not present

## 2016-03-25 DIAGNOSIS — M25561 Pain in right knee: Secondary | ICD-10-CM | POA: Diagnosis not present

## 2016-03-25 DIAGNOSIS — R2689 Other abnormalities of gait and mobility: Secondary | ICD-10-CM | POA: Diagnosis not present

## 2016-03-25 DIAGNOSIS — M25551 Pain in right hip: Secondary | ICD-10-CM | POA: Diagnosis not present

## 2016-03-25 DIAGNOSIS — I4891 Unspecified atrial fibrillation: Secondary | ICD-10-CM | POA: Diagnosis not present

## 2016-03-25 DIAGNOSIS — R296 Repeated falls: Secondary | ICD-10-CM | POA: Diagnosis not present

## 2016-03-26 ENCOUNTER — Encounter: Payer: Self-pay | Admitting: Adult Health

## 2016-03-26 ENCOUNTER — Non-Acute Institutional Stay (SKILLED_NURSING_FACILITY): Payer: Medicare Other | Admitting: Adult Health

## 2016-03-26 DIAGNOSIS — K582 Mixed irritable bowel syndrome: Secondary | ICD-10-CM

## 2016-03-26 DIAGNOSIS — I4891 Unspecified atrial fibrillation: Secondary | ICD-10-CM | POA: Diagnosis not present

## 2016-03-26 DIAGNOSIS — M25561 Pain in right knee: Secondary | ICD-10-CM | POA: Diagnosis not present

## 2016-03-26 DIAGNOSIS — R296 Repeated falls: Secondary | ICD-10-CM | POA: Diagnosis not present

## 2016-03-26 DIAGNOSIS — K219 Gastro-esophageal reflux disease without esophagitis: Secondary | ICD-10-CM | POA: Diagnosis not present

## 2016-03-26 DIAGNOSIS — E039 Hypothyroidism, unspecified: Secondary | ICD-10-CM | POA: Diagnosis not present

## 2016-03-26 DIAGNOSIS — M6259 Muscle wasting and atrophy, not elsewhere classified, multiple sites: Secondary | ICD-10-CM | POA: Diagnosis not present

## 2016-03-26 DIAGNOSIS — I10 Essential (primary) hypertension: Secondary | ICD-10-CM | POA: Diagnosis not present

## 2016-03-26 DIAGNOSIS — I350 Nonrheumatic aortic (valve) stenosis: Secondary | ICD-10-CM

## 2016-03-26 DIAGNOSIS — M25551 Pain in right hip: Secondary | ICD-10-CM | POA: Diagnosis not present

## 2016-03-26 DIAGNOSIS — R2689 Other abnormalities of gait and mobility: Secondary | ICD-10-CM | POA: Diagnosis not present

## 2016-03-26 NOTE — Progress Notes (Signed)
Patient ID: Susan [name], female   DOB: [birth year], 82 y.o.   MRN: [id number]  Location:   [facility]   Place of Service:   SNF  Provider:  Cindi Carbon, [place] [phone]  PCP: Nyoka Cowden, MD Patient Care Team: Marletta Lor, MD as PCP - General (Internal Medicine)  Extended Emergency Contact Information Primary Emergency Contact: Rema Fendt States of [place] Phone: [phone] Relation: Daughter  Code Status: DNR Goals of care:  Advanced Directive information Advanced Directives 03/20/2016  Does patient have an advance directive? Yes  Type of Paramedic of [place];Living will  Copy of advanced directive(s) in chart? Yes  Would patient like information on creating an advanced directive? -     Allergies  Allergen Reactions  . Penicillins Diarrhea    Has patient had a PCN reaction causing immediate rash, facial/tongue/throat swelling, SOB or lightheadedness with hypotension: YES Has patient had a PCN reaction causing severe rash involving mucus membranes or skin necrosis:NO Has patient had a PCN reaction that required hospitalization NO Has patient had a PCN reaction occurring within the last 10 years: NO If all of the above answers are "NO", then may proceed with Cephalosporin use.  . Ace Inhibitors     REACTION: cough  . Hydrocodone     REACTION: Hallucinations---minor    Chief Complaint  Patient presents with  . Discharge Note    HPI:  82 y.o. female admitted to rehab after a hospitalization for chest pain with afib and rapid RVR. She is now ready for discharge. She has complete therapy and is independent with a walker. Reports constipation alternating with diarrhea which is her baseline due to IBS. She is followed by Dr. Ardis Hughs for this issue and reports the only thing that has worked is immodium. She is eating and drinking normally. She was supposed to go home on 9/15 but felt weak  and decided to stay over the weekend.  CBC and BMP were obtained with no new findings and her symptoms resolved. She has not had any palpitations, chest pain or sob.   In regards to the afib during her hospital stay she converted to NSR spontaneously, CHADS2vasc score is 5. -2D ECHO showed normal EF and wall motion with moderate AS Due to her hx of falls and poor balance she was not placed on anticoagulation.  TSH was low during her stay at 0.180.  Synthroid dose was cut in half to 37.5 mcg  Past Medical History:  Diagnosis Date  . Aortic stenosis   . Atrial fibrillation, new onset ([place])   . CARCINOMA, BLADDER, TRANSITIONAL CELL 10/24/2007  . GERD (gastroesophageal reflux disease)   . Heart murmur   . HYPERTENSION 01/07/2007  . HYPOTHYROIDISM 01/07/2007  . IBS 05/03/2008  . MIXED HEARING LOSS BILATERAL 09/28/2009  . OSTEOARTHRITIS, KNEE 04/25/2007  . UNSPECIFIED ARTHROPATHY MULTIPLE SITES 10/26/2009    Past Surgical History:  Procedure Laterality Date  . BELPHAROPTOSIS REPAIR    . BLADDER TUMOR EXCISION    . BUNIONECTOMY     bilateral  . CATARACT EXTRACTION     bilateral  . TONSILLECTOMY AND ADENOIDECTOMY     x2      reports that she quit smoking about 42 years ago. Her smoking use included Cigarettes. She has a 10.00 pack-year smoking history. She has never used smokeless tobacco. She reports that she drinks alcohol. She reports that she does not use drugs. Social History   Social History  .  Marital status: Widowed    Spouse name: N/A  . Number of children: 3  . Years of education: N/A   Occupational History  . retired Retired   Social History Main Topics  . Smoking status: Former Smoker    Packs/day: 0.50    Years: 20.00    Types: Cigarettes    Quit date: 09/05/1973  . Smokeless tobacco: Never Used  . Alcohol use Yes     Comment: ocass  . Drug use: No  . Sexual activity: Not on file   Other Topics Concern  . Not on file   Social History Narrative  . No  narrative on file   Functional Status Survey:    Allergies  Allergen Reactions  . Penicillins Diarrhea    Has patient had a PCN reaction causing immediate rash, facial/tongue/throat swelling, SOB or lightheadedness with hypotension: YES Has patient had a PCN reaction causing severe rash involving mucus membranes or skin necrosis:NO Has patient had a PCN reaction that required hospitalization NO Has patient had a PCN reaction occurring within the last 10 years: NO If all of the above answers are "NO", then may proceed with Cephalosporin use.  . Ace Inhibitors     REACTION: cough  . Hydrocodone     REACTION: Hallucinations---minor    Pertinent  Health Maintenance Due  Topic Date Due  . DEXA SCAN  03/31/1986  . INFLUENZA VACCINE  02/07/2016  . PNA vac Low Risk Adult  Completed    Medications:   Medication List       Accurate as of 03/26/16 11:03 AM. Always use your most recent med list.          acetaminophen 500 MG tablet Commonly known as:  TYLENOL Take 1,000 mg by mouth every 6 (six) hours as needed for mild pain. And 1 at bedtime   aspirin 81 MG tablet Take 1 tablet (81 mg total) by mouth Casali.   atorvastatin 20 MG tablet Commonly known as:  LIPITOR TAKE 1 TABLET ONCE Glaude.   calcium carbonate 500 MG chewable tablet Commonly known as:  TUMS - dosed in mg elemental calcium Chew 1 tablet by mouth as needed.   carvedilol 12.5 MG tablet Commonly known as:  COREG TAKE  (1)  TABLET TWICE A DAY WITH MEALS (BREAKFAST AND SUPPER)   famotidine 20 MG tablet Commonly known as:  PEPCID TAKE 1 TABLET TWICE Grinage.   glucosamine-chondroitin 500-400 MG tablet Take 1 tablet by mouth Wolbert as needed. For bones and joints   hydrocortisone 2.5 % rectal cream Commonly known as:  ANUSOL-HC Place 1 application rectally 2 (two) times Trawick as needed.   levothyroxine 75 MCG tablet Commonly known as:  SYNTHROID, LEVOTHROID Take 0.5 tablets (37.5 mcg total) by mouth [place]  before breakfast.   loperamide 2 MG tablet Commonly known as:  IMODIUM A-D Take 4 mg by mouth as needed.   multivitamin tablet Take 2 tablets by mouth Frieze.       Review of Systems  Constitutional: Negative for activity change, appetite change, chills, diaphoresis, fatigue, fever and unexpected weight change.  HENT: Positive for congestion (chronic).   Respiratory: Negative for cough, shortness of breath and wheezing.   Cardiovascular: Negative for chest pain, palpitations and leg swelling.  Gastrointestinal: Positive for constipation and diarrhea. Negative for abdominal distention and abdominal pain.  Genitourinary: Negative for difficulty urinating and dysuria.  Musculoskeletal: Negative for arthralgias, back pain, gait problem, joint swelling and myalgias.  Neurological: Negative for dizziness, tremors,  seizures, syncope, facial asymmetry, speech difficulty, weakness, light-headedness, numbness and headaches.  Psychiatric/Behavioral: Negative for agitation, behavioral problems and confusion.    Vitals:   03/26/16 1058  BP: (!) 156/77  Pulse: 64   There is no height or weight on file to calculate BMI. Physical Exam  Constitutional: She is oriented to person, place, and time. No distress.  HENT:  Head: Normocephalic and atraumatic.  Neck: No JVD present.  Cardiovascular: Normal rate and regular rhythm.   Murmur heard. Pulmonary/Chest: Effort normal and breath sounds normal.  Abdominal: Soft. Bowel sounds are normal. She exhibits no distension.  Neurological: She is alert and oriented to person, place, and time.  Skin: Skin is warm and dry. She is not diaphoretic.  Psychiatric: She has a normal mood and affect.  Nursing note and vitals reviewed.   Labs reviewed: Basic Metabolic Panel:  Recent Labs  02/06/16 1441 03/14/16 1310 03/15/16 0303  NA 144 142 144  K 3.7 3.5 3.6  CL 107 109 110  CO2 28 25 27   GLUCOSE 133* 128* 89  BUN 14 14 13   CREATININE 0.74 0.78  0.69  CALCIUM 9.5 9.1 9.0   Liver Function Tests:  Recent Labs  02/06/16 1441  AST 14  ALT 11  ALKPHOS 78  BILITOT 1.0  PROT 6.2  ALBUMIN 3.9   No results for input(s): LIPASE, AMYLASE in the last 8760 hours. No results for input(s): AMMONIA in the last 8760 hours. CBC:  Recent Labs  02/06/16 1441 03/14/16 1310 03/15/16 0303 03/16/16 0532  WBC 8.8 12.0* 9.6 9.6  NEUTROABS 5.5 9.9*  --   --   HGB 11.5* 12.1 10.5* 10.8*  HCT 34.9* 39.2 34.0* 35.3*  MCV 86.4 92.0 91.4 92.2  PLT 229.0 207 197 182   Cardiac Enzymes:  Recent Labs  03/14/16 1823 03/14/16 2205 03/15/16 0523  TROPONINI 0.68* 1.08* 1.01*   BNP: Invalid input(s): POCBNP CBG: No results for input(s): GLUCAP in the last 8760 hours.  Procedures and Imaging Studies During Stay: Dg Chest Port 1 View  Result Date: 03/14/2016 CLINICAL DATA:  Chest pain and palpitations EXAM: PORTABLE CHEST 1 VIEW COMPARISON:  None. FINDINGS: There is moderate cardiac enlargement. There is aortic atherosclerosis. No pleural effusion or edema. No airspace opacification. IMPRESSION: 1. No acute findings. 2. Mild cardiac enlargement and aortic atherosclerosis. Electronically Signed   By: Kerby Moors M.D.   On: 03/14/2016 14:46    Assessment/Plan:     1. Atrial fibrillation with RVR ([facility]) resolved Continue aspirin 81 mg qd  2. Essential hypertension Slight elevation but overall goal at her age with her fall risk would be <150/90 Continue Coreg 12.5 mg BID  3. Aortic stenosis Noted murmur on exam Followed by Dr. Percival Spanish  4. Irritable bowel syndrome with both constipation and diarrhea Stable  5. Gastroesophageal reflux disease without esophagitis Continue pepcid 20 mg BID  6. Hypothyroidism, unspecified hypothyroidism type Continue Synthroid 37.5 mcg qd F/U TSH due in 4 weeks  She is ready for discharge to independent living and has met her goals of care.   Patient is being discharged with the following home  health services:  NA  Patient is being discharged with the following durable medical equipment:  NA  Patient has been advised to f/u with their PCP in 4 weeks  Future labs/tests needed:  TSH

## 2016-03-27 DIAGNOSIS — R2689 Other abnormalities of gait and mobility: Secondary | ICD-10-CM | POA: Diagnosis not present

## 2016-03-27 DIAGNOSIS — M25551 Pain in right hip: Secondary | ICD-10-CM | POA: Diagnosis not present

## 2016-03-27 DIAGNOSIS — I4891 Unspecified atrial fibrillation: Secondary | ICD-10-CM | POA: Diagnosis not present

## 2016-03-27 DIAGNOSIS — M6259 Muscle wasting and atrophy, not elsewhere classified, multiple sites: Secondary | ICD-10-CM | POA: Diagnosis not present

## 2016-03-27 DIAGNOSIS — M25561 Pain in right knee: Secondary | ICD-10-CM | POA: Diagnosis not present

## 2016-03-27 DIAGNOSIS — R296 Repeated falls: Secondary | ICD-10-CM | POA: Diagnosis not present

## 2016-03-28 DIAGNOSIS — M25551 Pain in right hip: Secondary | ICD-10-CM | POA: Diagnosis not present

## 2016-03-28 DIAGNOSIS — M25561 Pain in right knee: Secondary | ICD-10-CM | POA: Diagnosis not present

## 2016-03-28 DIAGNOSIS — R296 Repeated falls: Secondary | ICD-10-CM | POA: Diagnosis not present

## 2016-03-28 DIAGNOSIS — M6259 Muscle wasting and atrophy, not elsewhere classified, multiple sites: Secondary | ICD-10-CM | POA: Diagnosis not present

## 2016-03-28 DIAGNOSIS — I4891 Unspecified atrial fibrillation: Secondary | ICD-10-CM | POA: Diagnosis not present

## 2016-03-28 DIAGNOSIS — R2689 Other abnormalities of gait and mobility: Secondary | ICD-10-CM | POA: Diagnosis not present

## 2016-03-29 MED ORDER — LEVOTHYROXINE SODIUM 75 MCG PO TABS: 37.5000 ug | ORAL_TABLET | Freq: Every day | ORAL | 0 refills | Status: DC

## 2016-03-29 NOTE — Addendum Note (Signed)
Addended by: Barnie Mort on: 03/29/2016 10:53 AM   Modules accepted: Orders

## 2016-03-29 NOTE — Addendum Note (Signed)
Addended by: Logan Bores on: 03/29/2016 11:08 AM   Modules accepted: Orders

## 2016-04-05 DIAGNOSIS — I4891 Unspecified atrial fibrillation: Secondary | ICD-10-CM | POA: Diagnosis not present

## 2016-04-05 DIAGNOSIS — R2689 Other abnormalities of gait and mobility: Secondary | ICD-10-CM | POA: Diagnosis not present

## 2016-04-05 DIAGNOSIS — M25551 Pain in right hip: Secondary | ICD-10-CM | POA: Diagnosis not present

## 2016-04-05 DIAGNOSIS — R296 Repeated falls: Secondary | ICD-10-CM | POA: Diagnosis not present

## 2016-04-05 DIAGNOSIS — M25561 Pain in right knee: Secondary | ICD-10-CM | POA: Diagnosis not present

## 2016-04-05 DIAGNOSIS — M6259 Muscle wasting and atrophy, not elsewhere classified, multiple sites: Secondary | ICD-10-CM | POA: Diagnosis not present

## 2016-04-06 DIAGNOSIS — R296 Repeated falls: Secondary | ICD-10-CM | POA: Diagnosis not present

## 2016-04-06 DIAGNOSIS — R2689 Other abnormalities of gait and mobility: Secondary | ICD-10-CM | POA: Diagnosis not present

## 2016-04-06 DIAGNOSIS — M25551 Pain in right hip: Secondary | ICD-10-CM | POA: Diagnosis not present

## 2016-04-06 DIAGNOSIS — M6259 Muscle wasting and atrophy, not elsewhere classified, multiple sites: Secondary | ICD-10-CM | POA: Diagnosis not present

## 2016-04-06 DIAGNOSIS — M25561 Pain in right knee: Secondary | ICD-10-CM | POA: Diagnosis not present

## 2016-04-06 DIAGNOSIS — I4891 Unspecified atrial fibrillation: Secondary | ICD-10-CM | POA: Diagnosis not present

## 2016-04-11 ENCOUNTER — Telehealth: Payer: Self-pay | Admitting: Internal Medicine

## 2016-04-11 DIAGNOSIS — I8391 Asymptomatic varicose veins of right lower extremity: Secondary | ICD-10-CM | POA: Diagnosis not present

## 2016-04-11 DIAGNOSIS — Z85828 Personal history of other malignant neoplasm of skin: Secondary | ICD-10-CM | POA: Diagnosis not present

## 2016-04-11 DIAGNOSIS — L720 Epidermal cyst: Secondary | ICD-10-CM | POA: Diagnosis not present

## 2016-04-11 DIAGNOSIS — I8392 Asymptomatic varicose veins of left lower extremity: Secondary | ICD-10-CM | POA: Diagnosis not present

## 2016-04-11 DIAGNOSIS — R7611 Nonspecific reaction to tuberculin skin test without active tuberculosis: Secondary | ICD-10-CM

## 2016-04-11 DIAGNOSIS — D2271 Melanocytic nevi of right lower limb, including hip: Secondary | ICD-10-CM | POA: Diagnosis not present

## 2016-04-11 DIAGNOSIS — L821 Other seborrheic keratosis: Secondary | ICD-10-CM | POA: Diagnosis not present

## 2016-04-11 DIAGNOSIS — D1801 Hemangioma of skin and subcutaneous tissue: Secondary | ICD-10-CM | POA: Diagnosis not present

## 2016-04-11 NOTE — Telephone Encounter (Signed)
Pt would like to have a order faxed over to go to [facility] and disregard the portable order that was faxed pt stated it was to expensive.

## 2016-04-12 ENCOUNTER — Telehealth: Payer: Self-pay | Admitting: Internal Medicine

## 2016-04-12 DIAGNOSIS — M25551 Pain in right hip: Secondary | ICD-10-CM | POA: Diagnosis not present

## 2016-04-12 DIAGNOSIS — R55 Syncope and collapse: Secondary | ICD-10-CM | POA: Diagnosis not present

## 2016-04-12 DIAGNOSIS — R278 Other lack of coordination: Secondary | ICD-10-CM | POA: Diagnosis not present

## 2016-04-12 DIAGNOSIS — R296 Repeated falls: Secondary | ICD-10-CM | POA: Diagnosis not present

## 2016-04-12 DIAGNOSIS — R0789 Other chest pain: Secondary | ICD-10-CM | POA: Diagnosis not present

## 2016-04-12 DIAGNOSIS — M25561 Pain in right knee: Secondary | ICD-10-CM | POA: Diagnosis not present

## 2016-04-12 DIAGNOSIS — M6259 Muscle wasting and atrophy, not elsewhere classified, multiple sites: Secondary | ICD-10-CM | POA: Diagnosis not present

## 2016-04-12 NOTE — Telephone Encounter (Signed)
Left message on voicemail at [facility] for [place] to call the office. I need to speak to her.

## 2016-04-12 NOTE — Telephone Encounter (Signed)
Susan [name] called back, told her I do not know what order she is talking about. Susan [name] said pt had +PPD test and a portable chest x-ray was ordered but pt's daughter wants x-ray done at [facility] due to cost. Told her okay I will put order in and someone will contact pt to schedule appt. Susan [name] verbalized understanding and said she will let pt's daughter know. Order done.

## 2016-04-12 NOTE — Telephone Encounter (Signed)
Error/ltd ° °

## 2016-04-12 NOTE — Telephone Encounter (Signed)
Susan [name] wanted to let you know that the xray is a walk-in only.

## 2016-04-16 DIAGNOSIS — R296 Repeated falls: Secondary | ICD-10-CM | POA: Diagnosis not present

## 2016-04-16 DIAGNOSIS — R278 Other lack of coordination: Secondary | ICD-10-CM | POA: Diagnosis not present

## 2016-04-16 DIAGNOSIS — M6259 Muscle wasting and atrophy, not elsewhere classified, multiple sites: Secondary | ICD-10-CM | POA: Diagnosis not present

## 2016-04-16 DIAGNOSIS — M25561 Pain in right knee: Secondary | ICD-10-CM | POA: Diagnosis not present

## 2016-04-16 DIAGNOSIS — M25551 Pain in right hip: Secondary | ICD-10-CM | POA: Diagnosis not present

## 2016-04-16 DIAGNOSIS — R0789 Other chest pain: Secondary | ICD-10-CM | POA: Diagnosis not present

## 2016-04-18 DIAGNOSIS — R296 Repeated falls: Secondary | ICD-10-CM | POA: Diagnosis not present

## 2016-04-18 DIAGNOSIS — R0789 Other chest pain: Secondary | ICD-10-CM | POA: Diagnosis not present

## 2016-04-18 DIAGNOSIS — M25561 Pain in right knee: Secondary | ICD-10-CM | POA: Diagnosis not present

## 2016-04-18 DIAGNOSIS — M6259 Muscle wasting and atrophy, not elsewhere classified, multiple sites: Secondary | ICD-10-CM | POA: Diagnosis not present

## 2016-04-18 DIAGNOSIS — M25551 Pain in right hip: Secondary | ICD-10-CM | POA: Diagnosis not present

## 2016-04-18 DIAGNOSIS — R278 Other lack of coordination: Secondary | ICD-10-CM | POA: Diagnosis not present

## 2016-04-20 DIAGNOSIS — L918 Other hypertrophic disorders of the skin: Secondary | ICD-10-CM | POA: Diagnosis not present

## 2016-04-20 DIAGNOSIS — Z85828 Personal history of other malignant neoplasm of skin: Secondary | ICD-10-CM | POA: Diagnosis not present

## 2016-04-20 DIAGNOSIS — L820 Inflamed seborrheic keratosis: Secondary | ICD-10-CM | POA: Diagnosis not present

## 2016-04-24 ENCOUNTER — Other Ambulatory Visit: Payer: Self-pay | Admitting: Internal Medicine

## 2016-04-25 DIAGNOSIS — R296 Repeated falls: Secondary | ICD-10-CM | POA: Diagnosis not present

## 2016-04-25 DIAGNOSIS — M25561 Pain in right knee: Secondary | ICD-10-CM | POA: Diagnosis not present

## 2016-04-25 DIAGNOSIS — M6259 Muscle wasting and atrophy, not elsewhere classified, multiple sites: Secondary | ICD-10-CM | POA: Diagnosis not present

## 2016-04-25 DIAGNOSIS — R278 Other lack of coordination: Secondary | ICD-10-CM | POA: Diagnosis not present

## 2016-04-25 DIAGNOSIS — M25551 Pain in right hip: Secondary | ICD-10-CM | POA: Diagnosis not present

## 2016-04-25 DIAGNOSIS — R0789 Other chest pain: Secondary | ICD-10-CM | POA: Diagnosis not present

## 2016-04-26 ENCOUNTER — Ambulatory Visit
Admission: RE | Admit: 2016-04-26 | Discharge: 2016-04-26 | Disposition: A | Discharge status: Home / Self Care | Payer: Medicare Other | Source: Ambulatory Visit | Attending: Adult Health | Admitting: Adult Health

## 2016-04-26 DIAGNOSIS — R7611 Nonspecific reaction to tuberculin skin test without active tuberculosis: Secondary | ICD-10-CM

## 2016-04-30 ENCOUNTER — Ambulatory Visit (INDEPENDENT_AMBULATORY_CARE_PROVIDER_SITE_OTHER): Payer: Medicare Other | Admitting: Internal Medicine

## 2016-04-30 ENCOUNTER — Encounter: Payer: Self-pay | Admitting: Internal Medicine

## 2016-04-30 DIAGNOSIS — I480 Paroxysmal atrial fibrillation: Secondary | ICD-10-CM | POA: Diagnosis not present

## 2016-04-30 DIAGNOSIS — I4891 Unspecified atrial fibrillation: Secondary | ICD-10-CM

## 2016-04-30 DIAGNOSIS — E034 Atrophy of thyroid (acquired): Secondary | ICD-10-CM

## 2016-04-30 DIAGNOSIS — I10 Essential (primary) hypertension: Secondary | ICD-10-CM

## 2016-04-30 DIAGNOSIS — I350 Nonrheumatic aortic (valve) stenosis: Secondary | ICD-10-CM

## 2016-04-30 LAB — TSH: TSH: 0.88 u[IU]/mL (ref 0.35–4.50)

## 2016-04-30 NOTE — Progress Notes (Signed)
Subjective:    Patient ID: Susan [name], female    DOB: 06/22/1919, 82 y.o.   MRN: [id number]  HPI  82 year old patient who has a history of aortic stenosis.  She is seen today following a recent hospital discharge.  She presented to the hospital with chest pain and was noted to have uncontrolled atrial fibrillation.  Due to a high fall risk, and other factors.  Anticoagulation was felt not to be indicated.  Presently she is on 81 mg of aspirin only.  Following her hospital discharge, she spent 10 days at a rehabilitation facility and has been home for approximately 2 weeks.  She continues to do well. Due to the atrial fibrillation.  Her supplemental levothyroxine was down titrated.  She feels well today Hospital records reviewed  Past Medical History:  Diagnosis Date  . Aortic stenosis   . Atrial fibrillation, new onset ([place])   . CARCINOMA, BLADDER, TRANSITIONAL CELL 10/24/2007  . GERD (gastroesophageal reflux disease)   . Heart murmur   . HYPERTENSION 01/07/2007  . HYPOTHYROIDISM 01/07/2007  . IBS 05/03/2008  . MIXED HEARING LOSS BILATERAL 09/28/2009  . OSTEOARTHRITIS, KNEE 04/25/2007  . UNSPECIFIED ARTHROPATHY MULTIPLE SITES 10/26/2009     Social History   Social History  . Marital status: Widowed    Spouse name: N/A  . Number of children: 3  . Years of education: N/A   Occupational History  . retired Retired   Social History Main Topics  . Smoking status: Former Smoker    Packs/day: 0.50    Years: 20.00    Types: Cigarettes    Quit date: 09/05/1973  . Smokeless tobacco: Never Used  . Alcohol use Yes     Comment: ocass  . Drug use: No  . Sexual activity: Not on file   Other Topics Concern  . Not on file   Social History Narrative  . No narrative on file    Past Surgical History:  Procedure Laterality Date  . BELPHAROPTOSIS REPAIR    . BLADDER TUMOR EXCISION    . BUNIONECTOMY     bilateral  . CATARACT EXTRACTION     bilateral  . TONSILLECTOMY AND  ADENOIDECTOMY     x2    Family History  Problem Relation Age of Onset  . Stomach cancer Mother   . Arthritis Mother   . Leukemia Father   . Kidney failure Father   . Anuerysm Brother     brain  . ALS Sister   . Stomach cancer Maternal Aunt   . Colon cancer Daughter   . Esophageal cancer Neg Hx   . Rectal cancer Neg Hx     Allergies  Allergen Reactions  . Penicillins Diarrhea    Has patient had a PCN reaction causing immediate rash, facial/tongue/throat swelling, SOB or lightheadedness with hypotension: YES Has patient had a PCN reaction causing severe rash involving mucus membranes or skin necrosis:NO Has patient had a PCN reaction that required hospitalization NO Has patient had a PCN reaction occurring within the last 10 years: NO If all of the above answers are "NO", then may proceed with Cephalosporin use.  . Ace Inhibitors     REACTION: cough  . Hydrocodone     REACTION: Hallucinations---minor    Current Outpatient Prescriptions on File Prior to Visit  Medication Sig Dispense Refill  . acetaminophen (TYLENOL) 500 MG tablet Take 1,000 mg by mouth every 6 (six) hours as needed for mild pain. And 1 at bedtime     .  aspirin 81 MG tablet Take 1 tablet (81 mg total) by mouth Bischof. 30 tablet   . atorvastatin (LIPITOR) 20 MG tablet TAKE 1 TABLET ONCE Berkey. 90 tablet 1  . calcium carbonate (TUMS) 500 MG chewable tablet Chew 1 tablet by mouth as needed.      . carvedilol (COREG) 12.5 MG tablet TAKE  (1)  TABLET TWICE A DAY WITH MEALS (BREAKFAST AND SUPPER) 180 tablet 0  . famotidine (PEPCID) 20 MG tablet TAKE 1 TABLET TWICE Weyers. 60 tablet 0  . glucosamine-chondroitin 500-400 MG tablet Take 1 tablet by mouth [place] as needed. For bones and joints    . hydrocortisone (ANUSOL-HC) 2.5 % rectal cream Place 1 application rectally 2 (two) times [place] as needed.    Marland Kitchen levothyroxine (SYNTHROID, LEVOTHROID) 75 MCG tablet Take 0.5 tablets (37.5 mcg total) by mouth [place] before breakfast.  30 tablet 0  . loperamide (IMODIUM A-D) 2 MG tablet Take 4 mg by mouth as needed.     . Multiple Vitamin (MULTIVITAMIN) tablet Take 2 tablets by mouth [place].      No current facility-administered medications on file prior to visit.     BP 128/70 (BP Location: Left Arm, Patient Position: Sitting, Cuff Size: Normal)   Pulse 70   Temp 98.5 F (36.9 C) (Oral)   Resp 20   SpO2 97%     Review of Systems  Constitutional: Positive for fatigue.  HENT: Negative for congestion, dental problem, hearing loss, rhinorrhea, sinus pressure, sore throat and tinnitus.   Eyes: Negative for pain, discharge and visual disturbance.  Respiratory: Negative for cough and shortness of breath.   Cardiovascular: Negative for chest pain, palpitations and leg swelling.  Gastrointestinal: Negative for abdominal distention, abdominal pain, blood in stool, constipation, diarrhea, nausea and vomiting.  Genitourinary: Negative for difficulty urinating, dysuria, flank pain, frequency, hematuria, pelvic pain, urgency, vaginal bleeding, vaginal discharge and vaginal pain.  Musculoskeletal: Positive for gait problem. Negative for arthralgias and joint swelling.  Skin: Negative for rash.  Neurological: Positive for weakness. Negative for dizziness, syncope, speech difficulty, numbness and headaches.  Hematological: Negative for adenopathy.  Psychiatric/Behavioral: Negative for agitation, behavioral problems and dysphoric mood. The patient is not nervous/anxious.        Objective:   Physical Exam  Constitutional: She is oriented to person, place, and time. She appears well-developed and well-nourished.  Uses a powered wheelchair Blood pressure well controlled O2 saturation 97  HENT:  Head: Normocephalic.  Right Ear: External ear normal.  Left Ear: External ear normal.  Mouth/Throat: Oropharynx is clear and moist.  Eyes: Conjunctivae and EOM are normal. Pupils are equal, round, and reactive to light.  Neck: Normal  range of motion. Neck supple. No thyromegaly present.  Cardiovascular: Normal rate, regular rhythm, normal heart sounds and intact distal pulses.   Rhythm is regular  Grade 4 sixths systolic murmur with radiation to the carotid distribution  Pulmonary/Chest: Effort normal and breath sounds normal.  Abdominal: Soft. Bowel sounds are normal. She exhibits no mass. There is no tenderness.  Musculoskeletal: Normal range of motion. She exhibits no edema.  Lymphadenopathy:    She has no cervical adenopathy.  Neurological: She is alert and oriented to person, place, and time.  Skin: Skin is warm and dry. No rash noted.  Psychiatric: She has a normal mood and affect. Her behavior is normal.          Assessment & Plan:   Paroxysmal atrial fibrillation.  Remains in normal sinus rhythm Aortic  stenosis Hypothyroidism.  We'll review a TSH due to recent dose change Essential hypertension Unsteady gait  Follow-up 3 months No change in medical regimen  Nyoka Cowden

## 2016-05-02 DIAGNOSIS — R0789 Other chest pain: Secondary | ICD-10-CM | POA: Diagnosis not present

## 2016-05-02 DIAGNOSIS — R296 Repeated falls: Secondary | ICD-10-CM | POA: Diagnosis not present

## 2016-05-02 DIAGNOSIS — M6259 Muscle wasting and atrophy, not elsewhere classified, multiple sites: Secondary | ICD-10-CM | POA: Diagnosis not present

## 2016-05-02 DIAGNOSIS — R278 Other lack of coordination: Secondary | ICD-10-CM | POA: Diagnosis not present

## 2016-05-02 DIAGNOSIS — M25551 Pain in right hip: Secondary | ICD-10-CM | POA: Diagnosis not present

## 2016-05-02 DIAGNOSIS — M25561 Pain in right knee: Secondary | ICD-10-CM | POA: Diagnosis not present

## 2016-05-03 DIAGNOSIS — Z23 Encounter for immunization: Secondary | ICD-10-CM | POA: Diagnosis not present

## 2016-05-09 DIAGNOSIS — R0789 Other chest pain: Secondary | ICD-10-CM | POA: Diagnosis not present

## 2016-05-09 DIAGNOSIS — M25551 Pain in right hip: Secondary | ICD-10-CM | POA: Diagnosis not present

## 2016-05-09 DIAGNOSIS — M25561 Pain in right knee: Secondary | ICD-10-CM | POA: Diagnosis not present

## 2016-05-09 DIAGNOSIS — R55 Syncope and collapse: Secondary | ICD-10-CM | POA: Diagnosis not present

## 2016-05-09 DIAGNOSIS — R278 Other lack of coordination: Secondary | ICD-10-CM | POA: Diagnosis not present

## 2016-05-09 DIAGNOSIS — R296 Repeated falls: Secondary | ICD-10-CM | POA: Diagnosis not present

## 2016-05-09 DIAGNOSIS — M6259 Muscle wasting and atrophy, not elsewhere classified, multiple sites: Secondary | ICD-10-CM | POA: Diagnosis not present

## 2016-05-10 DIAGNOSIS — R278 Other lack of coordination: Secondary | ICD-10-CM | POA: Diagnosis not present

## 2016-05-10 DIAGNOSIS — R0789 Other chest pain: Secondary | ICD-10-CM | POA: Diagnosis not present

## 2016-05-10 DIAGNOSIS — M6259 Muscle wasting and atrophy, not elsewhere classified, multiple sites: Secondary | ICD-10-CM | POA: Diagnosis not present

## 2016-05-10 DIAGNOSIS — R296 Repeated falls: Secondary | ICD-10-CM | POA: Diagnosis not present

## 2016-05-10 DIAGNOSIS — M25561 Pain in right knee: Secondary | ICD-10-CM | POA: Diagnosis not present

## 2016-05-10 DIAGNOSIS — M25551 Pain in right hip: Secondary | ICD-10-CM | POA: Diagnosis not present

## 2016-05-15 DIAGNOSIS — M2041 Other hammer toe(s) (acquired), right foot: Secondary | ICD-10-CM | POA: Diagnosis not present

## 2016-05-15 DIAGNOSIS — L84 Corns and callosities: Secondary | ICD-10-CM | POA: Diagnosis not present

## 2016-05-15 DIAGNOSIS — M2042 Other hammer toe(s) (acquired), left foot: Secondary | ICD-10-CM | POA: Diagnosis not present

## 2016-05-15 DIAGNOSIS — B351 Tinea unguium: Secondary | ICD-10-CM | POA: Diagnosis not present

## 2016-05-17 DIAGNOSIS — R278 Other lack of coordination: Secondary | ICD-10-CM | POA: Diagnosis not present

## 2016-05-17 DIAGNOSIS — R0789 Other chest pain: Secondary | ICD-10-CM | POA: Diagnosis not present

## 2016-05-17 DIAGNOSIS — M6259 Muscle wasting and atrophy, not elsewhere classified, multiple sites: Secondary | ICD-10-CM | POA: Diagnosis not present

## 2016-05-17 DIAGNOSIS — M25551 Pain in right hip: Secondary | ICD-10-CM | POA: Diagnosis not present

## 2016-05-17 DIAGNOSIS — M25561 Pain in right knee: Secondary | ICD-10-CM | POA: Diagnosis not present

## 2016-05-17 DIAGNOSIS — R296 Repeated falls: Secondary | ICD-10-CM | POA: Diagnosis not present

## 2016-05-21 ENCOUNTER — Other Ambulatory Visit: Payer: Self-pay | Admitting: Gastroenterology

## 2016-05-21 ENCOUNTER — Other Ambulatory Visit: Payer: Self-pay | Admitting: Internal Medicine

## 2016-05-22 DIAGNOSIS — R0789 Other chest pain: Secondary | ICD-10-CM | POA: Diagnosis not present

## 2016-05-22 DIAGNOSIS — R296 Repeated falls: Secondary | ICD-10-CM | POA: Diagnosis not present

## 2016-05-22 DIAGNOSIS — M25561 Pain in right knee: Secondary | ICD-10-CM | POA: Diagnosis not present

## 2016-05-22 DIAGNOSIS — M6259 Muscle wasting and atrophy, not elsewhere classified, multiple sites: Secondary | ICD-10-CM | POA: Diagnosis not present

## 2016-05-22 DIAGNOSIS — M25551 Pain in right hip: Secondary | ICD-10-CM | POA: Diagnosis not present

## 2016-05-22 DIAGNOSIS — R278 Other lack of coordination: Secondary | ICD-10-CM | POA: Diagnosis not present

## 2016-06-17 ENCOUNTER — Other Ambulatory Visit: Payer: Self-pay | Admitting: Gastroenterology

## 2016-06-18 ENCOUNTER — Other Ambulatory Visit: Payer: Self-pay

## 2016-06-18 MED ORDER — FAMOTIDINE 20 MG PO TABS: 20.0000 mg | ORAL_TABLET | Freq: Two times a day (BID) | ORAL | 0 refills | Status: DC

## 2016-07-17 ENCOUNTER — Encounter: Payer: Self-pay | Admitting: Internal Medicine

## 2016-07-17 ENCOUNTER — Ambulatory Visit (INDEPENDENT_AMBULATORY_CARE_PROVIDER_SITE_OTHER)
Admission: RE | Admit: 2016-07-17 | Discharge: 2016-07-17 | Disposition: A | Discharge status: Home / Self Care | Payer: Medicare Other | Source: Ambulatory Visit | Attending: Internal Medicine | Admitting: Internal Medicine

## 2016-07-17 ENCOUNTER — Ambulatory Visit (INDEPENDENT_AMBULATORY_CARE_PROVIDER_SITE_OTHER): Payer: Medicare Other | Admitting: Internal Medicine

## 2016-07-17 VITALS — BP 166/88 | HR 72 | Temp 97.9°F

## 2016-07-17 DIAGNOSIS — S0990XA Unspecified injury of head, initial encounter: Secondary | ICD-10-CM | POA: Diagnosis not present

## 2016-07-17 DIAGNOSIS — I10 Essential (primary) hypertension: Secondary | ICD-10-CM | POA: Diagnosis not present

## 2016-07-17 DIAGNOSIS — G44319 Acute post-traumatic headache, not intractable: Secondary | ICD-10-CM

## 2016-07-17 DIAGNOSIS — I350 Nonrheumatic aortic (valve) stenosis: Secondary | ICD-10-CM | POA: Diagnosis not present

## 2016-07-17 NOTE — Patient Instructions (Signed)
Head CT scan as discussed to rule out a traumatic brain injury  Take [phone] mg of Tylenol every 6 hours as needed for pain relief or fever.  Avoid taking more than 3000 mg in a 24-hour period (  This may cause liver damage).  Call or return to clinic prn if these symptoms worsen or fail to improve as anticipated.

## 2016-07-17 NOTE — Progress Notes (Signed)
Subjective:    Patient ID: Susan [name], female    DOB: [birth year], 83 y.o.   MRN: [id number]  HPI 83 year old patient who has a history of essential hypertension as well as aortic stenosis.  In general has done fairly well.  Approximately 10 days ago.  She fell striking her head against her bedpost.  For the past 7 days she has had [place]orno continuous headache.  Denies any focal neurological symptoms She ambulates via a powered wheelchair Patient states that headaches are extremely unusual   Past Medical History:  Diagnosis Date  . Aortic stenosis   . Atrial fibrillation, new onset ([place])   . CARCINOMA, BLADDER, TRANSITIONAL CELL 10/24/2007  . GERD (gastroesophageal reflux disease)   . Heart murmur   . HYPERTENSION 01/07/2007  . HYPOTHYROIDISM 01/07/2007  . IBS 05/03/2008  . MIXED HEARING LOSS BILATERAL 09/28/2009  . OSTEOARTHRITIS, KNEE 04/25/2007  . UNSPECIFIED ARTHROPATHY MULTIPLE SITES 10/26/2009     Social History   Social History  . Marital status: Widowed    Spouse name: N/A  . Number of children: 3  . Years of education: N/A   Occupational History  . retired Retired   Social History Main Topics  . Smoking status: Former Smoker    Packs/day: 0.50    Years: 20.00    Types: Cigarettes    Quit date: 09/05/1973  . Smokeless tobacco: Never Used  . Alcohol use Yes     Comment: ocass  . Drug use: No  . Sexual activity: Not on file   Other Topics Concern  . Not on file   Social History Narrative  . No narrative on file    Past Surgical History:  Procedure Laterality Date  . BELPHAROPTOSIS REPAIR    . BLADDER TUMOR EXCISION    . BUNIONECTOMY     bilateral  . CATARACT EXTRACTION     bilateral  . TONSILLECTOMY AND ADENOIDECTOMY     x2    Family History  Problem Relation Age of Onset  . Stomach cancer Mother   . Arthritis Mother   . Leukemia Father   . Kidney failure Father   . Anuerysm Brother     brain  . ALS Sister   . Stomach cancer Maternal Aunt     . Colon cancer Daughter   . Esophageal cancer Neg Hx   . Rectal cancer Neg Hx     Allergies  Allergen Reactions  . Penicillins Diarrhea    Has patient had a PCN reaction causing immediate rash, facial/tongue/throat swelling, SOB or lightheadedness with hypotension: YES Has patient had a PCN reaction causing severe rash involving mucus membranes or skin necrosis:NO Has patient had a PCN reaction that required hospitalization NO Has patient had a PCN reaction occurring within the last 10 years: NO If all of the above answers are "NO", then may proceed with Cephalosporin use.  . Ace Inhibitors     REACTION: cough  . Hydrocodone     REACTION: Hallucinations---minor    Current Outpatient Prescriptions on File Prior to Visit  Medication Sig Dispense Refill  . acetaminophen (TYLENOL) 500 MG tablet Take 1,000 mg by mouth every 6 (six) hours as needed for mild pain. And 1 at bedtime     . aspirin 81 MG tablet Take 1 tablet (81 mg total) by mouth Verret. 30 tablet   . atorvastatin (LIPITOR) 20 MG tablet TAKE 1 TABLET ONCE Bozman. 90 tablet 0  . calcium carbonate (TUMS) 500 MG chewable tablet Chew  1 tablet by mouth as needed.      . carvedilol (COREG) 12.5 MG tablet TAKE  (1)  TABLET TWICE A DAY WITH MEALS (BREAKFAST AND SUPPER) 180 tablet 0  . famotidine (PEPCID) 20 MG tablet Take 1 tablet (20 mg total) by mouth 2 (two) times [place]. 60 tablet 0  . glucosamine-chondroitin 500-400 MG tablet Take 1 tablet by mouth [place] as needed. For bones and joints    . hydrocortisone (ANUSOL-HC) 2.5 % rectal cream Place 1 application rectally 2 (two) times [place] as needed.    Marland Kitchen levothyroxine (SYNTHROID, LEVOTHROID) 75 MCG tablet Take 0.5 tablets (37.5 mcg total) by mouth [place] before breakfast. 30 tablet 0  . loperamide (IMODIUM A-D) 2 MG tablet Take 4 mg by mouth as needed.     . Multiple Vitamin (MULTIVITAMIN) tablet Take 2 tablets by mouth [place].      No current facility-administered medications on file  prior to visit.     BP (!) 166/88 (BP Location: Right Arm, Patient Position: Sitting, Cuff Size: Normal)   Pulse 72   Temp 97.9 F (36.6 C) (Oral)   SpO2 97%      Review of Systems  Constitutional: Positive for fatigue.  HENT: Negative for congestion, dental problem, hearing loss, rhinorrhea, sinus pressure, sore throat and tinnitus.   Eyes: Negative for pain, discharge and visual disturbance.  Respiratory: Negative for cough and shortness of breath.   Cardiovascular: Negative for chest pain, palpitations and leg swelling.  Gastrointestinal: Negative for abdominal distention, abdominal pain, blood in stool, constipation, diarrhea, nausea and vomiting.  Genitourinary: Negative for difficulty urinating, dysuria, flank pain, frequency, hematuria, pelvic pain, urgency, vaginal bleeding, vaginal discharge and vaginal pain.  Musculoskeletal: Positive for gait problem. Negative for arthralgias and joint swelling.  Skin: Negative for rash.  Neurological: Positive for dizziness, weakness and headaches. Negative for syncope, speech difficulty and numbness.  Hematological: Negative for adenopathy.  Psychiatric/Behavioral: Negative for agitation, behavioral problems and dysphoric mood. The patient is not nervous/anxious.        Objective:   Physical Exam  Constitutional: She is oriented to person, place, and time. She appears well-developed and well-nourished. No distress.  Blood pressure 144/84  HENT:  Head: Normocephalic.  Right Ear: External ear normal.  Left Ear: External ear normal.  Mouth/Throat: Oropharynx is clear and moist.  Eyes: Conjunctivae and EOM are normal. Pupils are equal, round, and reactive to light.  Neck: Normal range of motion. Neck supple. No thyromegaly present.  Cardiovascular: Normal rate, regular rhythm and normal heart sounds.   Pulmonary/Chest: Effort normal and breath sounds normal.  Abdominal: Soft. Bowel sounds are normal. She exhibits no mass. There is no  tenderness.  Lymphadenopathy:    She has no cervical adenopathy.  Neurological: She is alert and oriented to person, place, and time.  Pupillary responses normal.  Extraocular muscles full No facial asymmetry No drift of the outstretched arms No motor weakness  Alert and appropriate  Skin: Skin is warm and dry. No rash noted.  Psychiatric: She has a normal mood and affect. Her behavior is normal.          Assessment & Plan:   Posttraumatic headaches.  Will check a head CT to rule out subdural hematoma Tylenol when necessary Essential hypertension, stable Gait abnormality History of aortic stenosis  Will report any medical changes, new or worsening symptoms Schedule a head CT without contrast  Nyoka Cowden

## 2016-07-17 NOTE — Progress Notes (Signed)
Pre visit review using our clinic review tool, if applicable. No additional management support is needed unless otherwise documented below in the visit note. 

## 2016-07-24 ENCOUNTER — Encounter: Payer: Self-pay | Admitting: Internal Medicine

## 2016-07-24 ENCOUNTER — Ambulatory Visit (INDEPENDENT_AMBULATORY_CARE_PROVIDER_SITE_OTHER): Payer: Medicare Other | Admitting: Internal Medicine

## 2016-07-24 VITALS — BP 140/76 | HR 72 | Temp 98.3°F | Ht 61.0 in | Wt 159.0 lb

## 2016-07-24 DIAGNOSIS — I10 Essential (primary) hypertension: Secondary | ICD-10-CM

## 2016-07-24 DIAGNOSIS — G4486 Cervicogenic headache: Secondary | ICD-10-CM

## 2016-07-24 DIAGNOSIS — R51 Headache: Secondary | ICD-10-CM | POA: Diagnosis not present

## 2016-07-24 NOTE — Progress Notes (Signed)
Pre visit review using our clinic review tool, if applicable. No additional management support is needed unless otherwise documented below in the visit note. 

## 2016-07-24 NOTE — Progress Notes (Signed)
Subjective:    Patient ID: Susan [name], female    DOB: 1919-03-27, 83 y.o.   MRN: [id number]  HPI   83 year old patient who has a history of essential hypertension.  Over the past 6 days she has had episodic headaches.  These seem to start in the left posterior neck area and then become more diffuse.  She did fall approximately 3 weeks ago and did have a subsequent head CT to rule out serious pathology.  This was unremarkable.  Today she yesterday she was bothered a bit by headache.  Headache seems to be improved with Tylenol.  Past Medical History:  Diagnosis Date  . Aortic stenosis   . Atrial fibrillation, new onset ([place])   . CARCINOMA, BLADDER, TRANSITIONAL CELL 10/24/2007  . GERD (gastroesophageal reflux disease)   . Heart murmur   . HYPERTENSION 01/07/2007  . HYPOTHYROIDISM 01/07/2007  . IBS 05/03/2008  . MIXED HEARING LOSS BILATERAL 09/28/2009  . OSTEOARTHRITIS, KNEE 04/25/2007  . UNSPECIFIED ARTHROPATHY MULTIPLE SITES 10/26/2009     Social History   Social History  . Marital status: Widowed    Spouse name: N/A  . Number of children: 3  . Years of education: N/A   Occupational History  . retired Retired   Social History Main Topics  . Smoking status: Former Smoker    Packs/day: 0.50    Years: 20.00    Types: Cigarettes    Quit date: 09/05/1973  . Smokeless tobacco: Never Used  . Alcohol use Yes     Comment: ocass  . Drug use: No  . Sexual activity: Not on file   Other Topics Concern  . Not on file   Social History Narrative  . No narrative on file    Past Surgical History:  Procedure Laterality Date  . BELPHAROPTOSIS REPAIR    . BLADDER TUMOR EXCISION    . BUNIONECTOMY     bilateral  . CATARACT EXTRACTION     bilateral  . TONSILLECTOMY AND ADENOIDECTOMY     x2    Family History  Problem Relation Age of Onset  . Stomach cancer Mother   . Arthritis Mother   . Leukemia Father   . Kidney failure Father   . Anuerysm Brother     brain  . ALS  Sister   . Stomach cancer Maternal Aunt   . Colon cancer Daughter   . Esophageal cancer Neg Hx   . Rectal cancer Neg Hx     Allergies  Allergen Reactions  . Penicillins Diarrhea    Has patient had a PCN reaction causing immediate rash, facial/tongue/throat swelling, SOB or lightheadedness with hypotension: YES Has patient had a PCN reaction causing severe rash involving mucus membranes or skin necrosis:NO Has patient had a PCN reaction that required hospitalization NO Has patient had a PCN reaction occurring within the last 10 years: NO If all of the above answers are "NO", then may proceed with Cephalosporin use.  . Ace Inhibitors     REACTION: cough  . Hydrocodone     REACTION: Hallucinations---minor    Current Outpatient Prescriptions on File Prior to Visit  Medication Sig Dispense Refill  . acetaminophen (TYLENOL) 500 MG tablet Take 1,000 mg by mouth every 6 (six) hours as needed for mild pain. And 1 at bedtime     . aspirin 81 MG tablet Take 1 tablet (81 mg total) by mouth Manzi. 30 tablet   . atorvastatin (LIPITOR) 20 MG tablet TAKE 1 TABLET ONCE Sand[place]er.  90 tablet 0  . calcium carbonate (TUMS) 500 MG chewable tablet Chew 1 tablet by mouth as needed.      . carvedilol (COREG) 12.5 MG tablet TAKE  (1)  TABLET TWICE A DAY WITH MEALS (BREAKFAST AND SUPPER) 180 tablet 0  . glucosamine-chondroitin 500-400 MG tablet Take 1 tablet by mouth [place] as needed. For bones and joints    . hydrocortisone (ANUSOL-HC) 2.5 % rectal cream Place 1 application rectally 2 (two) times Vern[place] as needed.    Marland Kitchen levothyroxine (SYNTHROID, LEVOTHROID) 75 MCG tablet Take 0.5 tablets (37.5 mcg total) by mouth [place] before breakfast. 30 tablet 0  . loperamide (IMODIUM A-D) 2 MG tablet Take 4 mg by mouth as needed.     . Multiple Vitamin (MULTIVITAMIN) tablet Take 2 tablets by mouth Jahnke.     . famotidine (PEPCID) 20 MG tablet Take 1 tablet (20 mg total) by mouth 2 (two) times Brees. 60 tablet 0   No current  facility-administered medications on file prior to visit.     BP 140/76 (BP Location: Right Arm, Patient Position: Sitting, Cuff Size: Normal)   Pulse 72   Temp 98.3 F (36.8 C) (Oral)   Ht 5\' 1"  (1.549 m)   Wt 159 lb (72.1 kg)   SpO2 97%   BMI 30.04 kg/m     Review of Systems  Constitutional: Negative.   HENT: Negative for congestion, dental problem, hearing loss, rhinorrhea, sinus pressure, sore throat and tinnitus.   Eyes: Negative for pain, discharge and visual disturbance.  Respiratory: Negative for cough and shortness of breath.   Cardiovascular: Negative for chest pain, palpitations and leg swelling.  Gastrointestinal: Negative for abdominal distention, abdominal pain, blood in stool, constipation, diarrhea, nausea and vomiting.  Genitourinary: Negative for difficulty urinating, dysuria, flank pain, frequency, hematuria, pelvic pain, urgency, vaginal bleeding, vaginal discharge and vaginal pain.  Musculoskeletal: Positive for neck pain. Negative for arthralgias, gait problem, joint swelling and neck stiffness.  Skin: Negative for rash.  Neurological: Positive for headaches. Negative for dizziness, syncope, speech difficulty, weakness and numbness.  Hematological: Negative for adenopathy.  Psychiatric/Behavioral: Negative for agitation, behavioral problems and dysphoric mood. The patient is not nervous/anxious.        Objective:   Physical Exam  Constitutional: She is oriented to person, place, and time. She appears well-developed and well-nourished.  HENT:  Head: Normocephalic.  Right Ear: External ear normal.  Left Ear: External ear normal.  Mouth/Throat: Oropharynx is clear and moist.  Eyes: Conjunctivae and EOM are normal. Pupils are equal, round, and reactive to light.  Neck: Neck supple. No thyromegaly present.  Mild decreased range of motion in all spheres.  Range of motion did not aggravate neck pain or headache  Posterior neck musculature appeared to be  normal.  No tenderness  Cardiovascular: Normal rate, regular rhythm, normal heart sounds and intact distal pulses.   Pulmonary/Chest: Effort normal and breath sounds normal.  Abdominal: Soft. Bowel sounds are normal. She exhibits no mass. There is no tenderness.  Musculoskeletal: Normal range of motion.  Lymphadenopathy:    She has no cervical adenopathy.  Neurological: She is alert and oriented to person, place, and time.  Skin: Skin is warm and dry. No rash noted.  Psychiatric: She has a normal mood and affect. Her behavior is normal.          Assessment & Plan:   Cervicogenic headaches. Recent fall with head trauma  Will continue symptomatic treatment.  Will try gentle massage and  warm compresses.  Continue Tylenol  Nyoka Cowden

## 2016-07-28 ENCOUNTER — Other Ambulatory Visit: Payer: Self-pay | Admitting: Gastroenterology

## 2016-08-13 ENCOUNTER — Other Ambulatory Visit: Payer: Self-pay | Admitting: Internal Medicine

## 2016-08-15 ENCOUNTER — Ambulatory Visit (INDEPENDENT_AMBULATORY_CARE_PROVIDER_SITE_OTHER): Payer: Medicare Other | Admitting: Cardiology

## 2016-08-15 ENCOUNTER — Encounter: Payer: Self-pay | Admitting: Cardiology

## 2016-08-15 VITALS — BP 139/73 | HR 65 | Ht 61.0 in | Wt 155.8 lb

## 2016-08-15 DIAGNOSIS — R42 Dizziness and giddiness: Secondary | ICD-10-CM

## 2016-08-15 DIAGNOSIS — I480 Paroxysmal atrial fibrillation: Secondary | ICD-10-CM

## 2016-08-15 DIAGNOSIS — I350 Nonrheumatic aortic (valve) stenosis: Secondary | ICD-10-CM

## 2016-08-15 NOTE — Patient Instructions (Signed)
Medication Instructions:  OK to take Claritin 10 mg Clum for 2-3 days.  Labwork: NONE  Testing/Procedures: Your physician has recommended that you wear a 21 day event monitor. Event monitors are medical devices that record the heart's electrical activity. Doctors most often [place] these monitors to diagnose arrhythmias. Arrhythmias are problems with the speed or rhythm of the heartbeat. The monitor is a small, portable device. You can wear one while you do your normal [place] activities. This is usually used to diagnose what is causing palpitations/syncope (passing out).   Follow-Up: Wednesday, April 11th at 11:20 AM with Dr. Percival Spanish at [facility] office.    Any Other Special Instructions Will Be Listed Below (If Applicable).     If you need a refill on your cardiac medications before your next appointment, please call your pharmacy.

## 2016-08-15 NOTE — Assessment & Plan Note (Signed)
Moderate AS by echo with normal LVF Sept 2017

## 2016-08-15 NOTE — Assessment & Plan Note (Signed)
Pt seen in the office today after a "dizzy spell" that lasted at least an hour

## 2016-08-15 NOTE — Progress Notes (Signed)
08/15/2016 Susan [name]   [birth year]  [id number]  Primary Physician Nyoka Cowden, MD Primary Cardiologist: Dr Percival Spanish  HPI:  83 y/o female seen in the office today with her daughter. The pt has known AS, last echo Sept 2017- normal LVF with moderate AS. She has had PAF in the past and has severely dilated LA by echo. She is in the office today after she had an episode of significant dizziness [place] [facility]. She says when she was raised up in the chair after having her hair done she became dizzy. "The room was spinning". This lasted more than an hour. She denies any diaphoresis or palpitations. Her B/P was checked and was a little higher than normal for her. She denies any recent sinus problems but says she has had a recent cold and has had Lt facial pain. She saw her PCP who did a CT. This did not show a stroke but she did have Rt sided paranasal sinus disease.    Current Outpatient Prescriptions  Medication Sig Dispense Refill  . acetaminophen (TYLENOL) 500 MG tablet Take 1,000 mg by mouth every 6 (six) hours as needed for mild pain. And 1 at bedtime     . aspirin 81 MG tablet Take 1 tablet (81 mg total) by mouth [place]. 30 tablet   . atorvastatin (LIPITOR) 20 MG tablet TAKE 1 TABLET ONCE Pennie. 90 tablet 0  . calcium carbonate (TUMS) 500 MG chewable tablet Chew 1 tablet by mouth as needed.      . carvedilol (COREG) 12.5 MG tablet TAKE  (1)  TABLET TWICE A DAY WITH MEALS (BREAKFAST AND SUPPER) 180 tablet 0  . famotidine (PEPCID) 20 MG tablet TAKE 1 TABLET TWICE Sermons. 60 tablet 0  . glucosamine-chondroitin 500-400 MG tablet Take 1 tablet by mouth Mehlberg as needed. For bones and joints    . hydrocortisone (ANUSOL-HC) 2.5 % rectal cream Place 1 application rectally 2 (two) times Greaves as needed.    Marland Kitchen levothyroxine (SYNTHROID, LEVOTHROID) 75 MCG tablet Take 0.5 tablets (37.5 mcg total) by mouth Hummel before breakfast. 30 tablet 0  . loperamide (IMODIUM A-D) 2 MG tablet Take  4 mg by mouth as needed.     . Multiple Vitamin (MULTIVITAMIN) tablet Take 2 tablets by mouth Rideaux.      No current facility-administered medications for this visit.     Allergies  Allergen Reactions  . Penicillins Diarrhea    Has patient had a PCN reaction causing immediate rash, facial/tongue/throat swelling, SOB or lightheadedness with hypotension: YES Has patient had a PCN reaction causing severe rash involving mucus membranes or skin necrosis:NO Has patient had a PCN reaction that required hospitalization NO Has patient had a PCN reaction occurring within the last 10 years: NO If all of the above answers are "NO", then may proceed with Cephalosporin use.  . Ace Inhibitors     REACTION: cough  . Hydrocodone     REACTION: Hallucinations---minor    Social History   Social History  . Marital status: Widowed    Spouse name: N/A  . Number of children: 3  . Years of education: N/A   Occupational History  . retired Retired   Social History Main Topics  . Smoking status: Former Smoker    Packs/day: 0.50    Years: 20.00    Types: Cigarettes    Quit date: 09/05/1973  . Smokeless tobacco: Never Used  . Alcohol use Yes     Comment: ocass  .  Drug use: No  . Sexual activity: Not on file   Other Topics Concern  . Not on file   Social History Narrative  . No narrative on file     Review of Systems: General: negative for chills, fever, night sweats or weight changes.  Cardiovascular: negative for chest pain, dyspnea on exertion, edema, orthopnea, palpitations, paroxysmal nocturnal dyspnea or shortness of breath Dermatological: negative for rash Respiratory: negative for cough or wheezing Urologic: negative for hematuria Abdominal: negative for nausea, vomiting, diarrhea, bright red blood per rectum, melena, or hematemesis Neurologic: negative for visual changes, syncope, or dizziness All other systems reviewed and are otherwise negative except as noted above.    Blood  pressure 139/73, pulse 65, height 5\' 1"  (1.549 m), weight 155 lb 12.8 oz (70.7 kg).  General appearance: alert, cooperative and no distress Neck: no JVD and transmitted carotid murmur Lungs: clear to auscultation bilaterally Heart: regular rate and rhythm and 2/6 systolic murmur AOV Extremities: regular rate and rhythm and 2-3 systolic aortic murmur, decreased S2 Skin: Skin color, texture, turgor normal. No rashes or lesions Neurologic: Grossly normal  EKG NSR  ASSESSMENT AND PLAN:   Dizziness Pt seen in the office today after a "dizzy spell" that lasted at least an hour  Aortic stenosis Moderate AS by echo with normal LVF Sept 2017  Paroxysmal atrial fibrillation (HCC) History of PAF, NSR today and no history of recent palpitations   PLAN  I suggested a 3 week event monitor to r/o PAF or transient bradycardia. It does not sound like orthostatic symptoms. It did actually sound more like inner ear dizziness and I suggested Claritin 10 mg for a few days to see if that made any difference. F/U with Dr Percival Spanish in a few weeks.   Kerin Ransom PA-C 08/15/2016 12:03 PM

## 2016-08-15 NOTE — Assessment & Plan Note (Signed)
History of PAF, NSR today and no history of recent palpitations

## 2016-08-21 ENCOUNTER — Non-Acute Institutional Stay (SKILLED_NURSING_FACILITY): Payer: Medicare Other | Admitting: Internal Medicine

## 2016-08-21 ENCOUNTER — Encounter: Payer: Self-pay | Admitting: Internal Medicine

## 2016-08-21 ENCOUNTER — Ambulatory Visit: Payer: Medicare Other | Admitting: Internal Medicine

## 2016-08-21 DIAGNOSIS — I480 Paroxysmal atrial fibrillation: Secondary | ICD-10-CM

## 2016-08-21 DIAGNOSIS — I350 Nonrheumatic aortic (valve) stenosis: Secondary | ICD-10-CM

## 2016-08-21 DIAGNOSIS — J320 Chronic maxillary sinusitis: Secondary | ICD-10-CM

## 2016-08-21 DIAGNOSIS — H8111 Benign paroxysmal vertigo, right ear: Secondary | ICD-10-CM

## 2016-08-21 DIAGNOSIS — IMO0002 Reserved for concepts with insufficient information to code with codable children: Secondary | ICD-10-CM

## 2016-08-21 DIAGNOSIS — K582 Mixed irritable bowel syndrome: Secondary | ICD-10-CM | POA: Diagnosis not present

## 2016-08-21 DIAGNOSIS — E034 Atrophy of thyroid (acquired): Secondary | ICD-10-CM

## 2016-08-21 NOTE — Progress Notes (Signed)
Patient ID: Susan [name], female   DOB: 1918/11/24, 83 y.o.   MRN: [id number]  Provider:  Rexene Edison. Mariea Clonts, D.O., C.M.D. Location:  [facility] Room Number: [place] of Service:  SNF (31)  PCP: Nyoka Cowden, MD Patient Care Team: Marletta Lor, MD as PCP - General (Internal Medicine)  Extended Emergency Contact Information Primary Emergency Contact: King,Marian Address: [address], [place] [address] Johnnette Litter of [place] Phone: [phone] Relation: Daughter Secondary Emergency Contact: Christiana Fuchs [place] of [place] Phone: [phone] Mobile Phone: [phone] Relation: Daughter  Code Status: DNR--took time to discuss this with her today and she also read the handout we have at [facility] about do not resuscitate.  She was then clear that she does not want her life prolonged if she has a cardiopulmonary arrest.  [place] form was completed. She is also going to "clear this" with her daughter.    Goals of Care: Advanced Directive information Advanced Directives 08/21/2016  Does Patient Have a Medical Advance Directive? Yes  Type of Advance Directive Living will;Healthcare Power of [name] in Chart? Yes  Would patient like information on creating a medical advance directive? -   Chief Complaint  Patient presents with  . New Admit To SNF    Rehab admission    HPI: Patient is a 83 y.o. female seen today for admission to rehab for dizziness, elevated BP and sinus infection.  Most bps remain 0000000 to [id number] systolic.    She last saw Dr. Burnice Logan on 1/16 for htn and cervicogenic headache (left posterior neck).  She had a fall three weeks before that and CT head showed right paranasal sinus disease.  She had improvement in headaches with tylenol.  She then saw PA Kilroy at cardiology on 08/15/16.  She has h/o moderate AS per last echo 9/17 and normal  EF.  She has PAF.  She had dizziness during a [employer] appt after sitting up from getting her hair done--the room was spinning and it lasted more than an hour.  A 3 week event monitor was suggested to r/o PAF or transient bradycardia.  Claritin was recommended due to concerns that was more inner ear.  She feels it helped considerably and wants to restart it.    Past Medical History:  Diagnosis Date  . Aortic stenosis   . Atrial fibrillation, new onset ([place])   . CARCINOMA, BLADDER, TRANSITIONAL CELL 10/24/2007  . GERD (gastroesophageal reflux disease)   . Heart murmur   . HYPERTENSION 01/07/2007  . HYPOTHYROIDISM 01/07/2007  . IBS 05/03/2008  . MIXED HEARING LOSS BILATERAL 09/28/2009  . OSTEOARTHRITIS, KNEE 04/25/2007  . UNSPECIFIED ARTHROPATHY MULTIPLE SITES 10/26/2009   Past Surgical History:  Procedure Laterality Date  . BELPHAROPTOSIS REPAIR    . BLADDER TUMOR EXCISION    . BUNIONECTOMY     bilateral  . CATARACT EXTRACTION     bilateral  . TONSILLECTOMY AND ADENOIDECTOMY     x2    reports that she quit smoking about 42 years ago. Her smoking use included Cigarettes. She has a 10.00 pack-year smoking history. She has never used smokeless tobacco. She reports that she drinks alcohol. She reports that she does not use drugs. Social History   Social History  . Marital status: Widowed    Spouse name: N/A  . Number of children: 3  . Years of education: N/A  Occupational History  . retired Retired   Social History Main Topics  . Smoking status: Former Smoker    Packs/day: 0.50    Years: 20.00    Types: Cigarettes    Quit date: 09/05/1973  . Smokeless tobacco: Never Used  . Alcohol use Yes     Comment: ocass  . Drug use: No  . Sexual activity: Not on file   Other Topics Concern  . Not on file   Social History Narrative  . No narrative on file    Functional Status Survey:  independent, but would benefit from some adl assistance long term for safety  Family History   Problem Relation Age of Onset  . Stomach cancer Mother   . Arthritis Mother   . Leukemia Father   . Kidney failure Father   . Anuerysm Brother     brain  . ALS Sister   . Stomach cancer Maternal Aunt   . Colon cancer Daughter   . Esophageal cancer Neg Hx   . Rectal cancer Neg Hx     Health Maintenance  Topic Date Due  . TETANUS/TDAP  03/31/1940  . DEXA SCAN  03/31/1986  . INFLUENZA VACCINE  Completed  . ZOSTAVAX  Completed  . PNA vac Low Risk Adult  Completed    Allergies  Allergen Reactions  . Penicillins Diarrhea    Has patient had a PCN reaction causing immediate rash, facial/tongue/throat swelling, SOB or lightheadedness with hypotension: YES Has patient had a PCN reaction causing severe rash involving mucus membranes or skin necrosis:NO Has patient had a PCN reaction that required hospitalization NO Has patient had a PCN reaction occurring within the last 10 years: NO If all of the above answers are "NO", then may proceed with Cephalosporin use.  . Ace Inhibitors     REACTION: cough  . Hydrocodone     REACTION: Hallucinations---minor    Allergies as of 08/21/2016      Reactions   Penicillins Diarrhea   Has patient had a PCN reaction causing immediate rash, facial/tongue/throat swelling, SOB or lightheadedness with hypotension: YES Has patient had a PCN reaction causing severe rash involving mucus membranes or skin necrosis:NO Has patient had a PCN reaction that required hospitalization NO Has patient had a PCN reaction occurring within the last 10 years: NO If all of the above answers are "NO", then may proceed with Cephalosporin use.   Ace Inhibitors    REACTION: cough   Hydrocodone    REACTION: Hallucinations---minor      Medication List       Accurate as of 08/21/16 11:41 AM. Always use your most recent med list.          acetaminophen 500 MG tablet Commonly known as:  TYLENOL Take 1,000 mg by mouth every 6 (six) hours as needed for mild pain. And  1 at bedtime   aspirin 81 MG tablet Take 1 tablet (81 mg total) by mouth Halloran.   atorvastatin 20 MG tablet Commonly known as:  LIPITOR TAKE 1 TABLET ONCE [place]etta.   calcium carbonate 500 MG chewable tablet Commonly known as:  TUMS - dosed in mg elemental calcium Chew 1 tablet by mouth as needed.   carvedilol 12.5 MG tablet Commonly known as:  COREG TAKE  (1)  TABLET TWICE A DAY WITH MEALS (BREAKFAST AND SUPPER)   famotidine 20 MG tablet Commonly known as:  PEPCID TAKE 1 TABLET TWICE Giraldo.   glucosamine-chondroitin 500-400 MG tablet Take 1 tablet by mouth Noffke  as needed. For bones and joints   hydrocortisone 2.5 % rectal cream Commonly known as:  ANUSOL-HC Place 1 application rectally 2 (two) times Cousin as needed.   levothyroxine 75 MCG tablet Commonly known as:  SYNTHROID, LEVOTHROID Take 0.5 tablets (37.5 mcg total) by mouth Jacobsen before breakfast.   loperamide 2 MG tablet Commonly known as:  IMODIUM A-D Take 4 mg by mouth as needed.   multivitamin tablet Take 2 tablets by mouth Donner.       Review of Systems  Constitutional: Positive for fatigue. Negative for activity change, appetite change, chills and fever.  HENT: Positive for congestion and hearing loss.   Eyes: Negative for visual disturbance.  Respiratory: Negative for chest tightness and shortness of breath.   Cardiovascular: Negative for chest pain, palpitations and leg swelling.  Gastrointestinal: Positive for constipation and diarrhea. Negative for abdominal pain, nausea and vomiting.  Genitourinary: Negative for dysuria.  Musculoskeletal: Positive for arthralgias and gait problem.  Neurological: Positive for dizziness and headaches. Negative for weakness.       Fall three weeks ago  Psychiatric/Behavioral: Negative for agitation, confusion and sleep disturbance.    Vitals:   08/21/16 1131  BP: (!) 142/69  Pulse: 76  Resp: 18  Temp: 97.1 F (36.2 C)  TempSrc: Oral  SpO2: 95%   There is  no height or weight on file to calculate BMI. Physical Exam  Constitutional: She is oriented to person, place, and time. She appears well-developed and well-nourished. No distress.  HENT:  Head: Normocephalic and atraumatic.  Right Ear: External ear normal.  Left Ear: External ear normal.  Mouth/Throat: Oropharynx is clear and moist. No oropharyngeal exudate.  Quite HOH  Eyes: Conjunctivae and EOM are normal. Pupils are equal, round, and reactive to light.  Neck: Normal range of motion. Neck supple. No JVD present. No thyromegaly present.  Cardiovascular: Intact distal pulses.   Murmur heard. irreg irreg  Pulmonary/Chest: Effort normal and breath sounds normal. No respiratory distress.  Abdominal: Soft. Bowel sounds are normal. She exhibits no distension and no mass. There is no tenderness. There is no rebound and no guarding.  Musculoskeletal: Normal range of motion.  Uses power chair to get around typically  Lymphadenopathy:    She has no cervical adenopathy.  Neurological: She is alert and oriented to person, place, and time.  Skin: Skin is warm and dry.  Psychiatric: She has a normal mood and affect. Her behavior is normal. Judgment and thought content normal.    Labs reviewed: Basic Metabolic Panel:  Recent Labs  02/06/16 1441 03/14/16 1310 03/15/16 0303  NA 144 142 144  K 3.7 3.5 3.6  CL 107 109 110  CO2 28 25 27   GLUCOSE 133* 128* 89  BUN 14 14 13   CREATININE 0.74 0.78 0.69  CALCIUM 9.5 9.1 9.0   Liver Function Tests:  Recent Labs  02/06/16 1441  AST 14  ALT 11  ALKPHOS 78  BILITOT 1.0  PROT 6.2  ALBUMIN 3.9   No results for input(s): LIPASE, AMYLASE in the last 8760 hours. No results for input(s): AMMONIA in the last 8760 hours. CBC:  Recent Labs  02/06/16 1441 03/14/16 1310 03/15/16 0303 03/16/16 0532  WBC 8.8 12.0* 9.6 9.6  NEUTROABS 5.5 9.9*  --   --   HGB 11.5* 12.1 10.5* 10.8*  HCT 34.9* 39.2 34.0* 35.3*  MCV 86.4 92.0 91.4 92.2  PLT  229.0 207 197 182   Cardiac Enzymes:  Recent Labs  03/14/16  1823 03/14/16 2205 03/15/16 0523  TROPONINI 0.68* 1.08* 1.01*   BNP: Invalid input(s): POCBNP No results found for: HGBA1C Lab Results  Component Value Date   TSH 0.88 04/30/2016   No results found for: VITAMINB12 No results found for: FOLATE No results found for: IRON, TIBC, FERRITIN  Imaging and Procedures obtained prior to SNF admission: Ct Head Wo Contrast  Result Date: 07/17/2016 CLINICAL DATA:  83 y/o F; status post fall hitting head on bed post. EXAM: CT HEAD WITHOUT CONTRAST TECHNIQUE: Contiguous axial images were obtained from the base of the skull through the vertex without intravenous contrast. COMPARISON:  01/21/2013 CT head FINDINGS: Brain: No evidence of acute infarction, hemorrhage, hydrocephalus, extra-axial collection or mass lesion/mass effect. Mild chronic microvascular ischemic changes and parenchymal volume loss of the brain for age. Tiny chronic lacunar infarct and right cerebellar hemisphere. Vascular: Mild calcific atherosclerosis of cavernous internal carotid arteries. Skull: Normal. Negative for fracture or focal lesion. Sinuses/Orbits: Mild mucosal thickening of the right maxillary sinus and anterior ethmoid air cells. Normally pneumatized mastoid air cells. Bilateral intra-ocular lens replacement. Other: None. IMPRESSION: 1. No acute intracranial abnormality or displaced calvarial fracture is identified. 2. Mild chronic microvascular ischemic changes and parenchymal volume loss of the brain for age. 3. Mild right-sided paranasal sinus disease. Electronically Signed   By: Kristine Garbe M.D.   On: 07/17/2016 16:28    Assessment/Plan 1. Chronic maxillary sinusitis -pt reported improvement in vertigo when taking claritin  -aware of Beer's criteria feature, but it has not caused her any problems so would cont for a few more weeks then d/c  2. Positional vertigo of right ear -gradually  improving anyway, but claritin will dry up the maxillary sinus secretions I hope  3. Paroxysmal atrial fibrillation (HCC) -rate controlled, cont coreg, baby asa (is high risk for falling and 95) -there was some question about putting her on an event monitor, but she was not actually set up for one and her symptoms seem more inner ear related  4. Aortic valve stenosis, etiology of cardiac valve disease unspecified -stable and did not have any symptoms related to this at time of dizziness  5. Irritable bowel syndrome with both constipation and diarrhea -chronic and longstanding, she has a regimen of imodium when the diarrhea strikes and then gets constipated sometimes from its use  6. Hypothyroidism due to acquired atrophy of thyroid -cont levothyroxine 37.37mcg K[place]  Lab Results  Component Value Date   TSH 0.88 04/30/2016   Family/ staff Communication: discussed with rehab nurse, manager, pt  Labs/tests ordered:  No new

## 2016-08-22 DIAGNOSIS — M62561 Muscle wasting and atrophy, not elsewhere classified, right lower leg: Secondary | ICD-10-CM | POA: Diagnosis not present

## 2016-08-22 DIAGNOSIS — R2689 Other abnormalities of gait and mobility: Secondary | ICD-10-CM | POA: Diagnosis not present

## 2016-08-22 DIAGNOSIS — M170 Bilateral primary osteoarthritis of knee: Secondary | ICD-10-CM | POA: Diagnosis not present

## 2016-08-22 DIAGNOSIS — R42 Dizziness and giddiness: Secondary | ICD-10-CM | POA: Diagnosis not present

## 2016-08-22 DIAGNOSIS — M62562 Muscle wasting and atrophy, not elsewhere classified, left lower leg: Secondary | ICD-10-CM | POA: Diagnosis not present

## 2016-08-22 DIAGNOSIS — Z9181 History of falling: Secondary | ICD-10-CM | POA: Diagnosis not present

## 2016-08-22 DIAGNOSIS — I480 Paroxysmal atrial fibrillation: Secondary | ICD-10-CM | POA: Diagnosis not present

## 2016-08-22 DIAGNOSIS — R278 Other lack of coordination: Secondary | ICD-10-CM | POA: Diagnosis not present

## 2016-08-22 DIAGNOSIS — R296 Repeated falls: Secondary | ICD-10-CM | POA: Diagnosis not present

## 2016-08-22 DIAGNOSIS — R2681 Unsteadiness on feet: Secondary | ICD-10-CM | POA: Diagnosis not present

## 2016-08-22 LAB — CBC AND DIFFERENTIAL
HCT: 37 % (ref 36–46)
HEMOGLOBIN: 11.7 g/dL — AB (ref 12.0–16.0)
Platelets: 161 10*3/uL (ref 150–399)
WBC: 6.9 10*3/mL

## 2016-08-22 LAB — BASIC METABOLIC PANEL
BUN: 11 mg/dL (ref 4–21)
CREATININE: 0.7 mg/dL (ref 0.5–1.1)
GLUCOSE: 97 mg/dL
POTASSIUM: 4 mmol/L (ref 3.4–5.3)
SODIUM: 145 mmol/L (ref 137–147)

## 2016-08-23 DIAGNOSIS — R2689 Other abnormalities of gait and mobility: Secondary | ICD-10-CM | POA: Diagnosis not present

## 2016-08-23 DIAGNOSIS — R278 Other lack of coordination: Secondary | ICD-10-CM | POA: Diagnosis not present

## 2016-08-23 DIAGNOSIS — M62562 Muscle wasting and atrophy, not elsewhere classified, left lower leg: Secondary | ICD-10-CM | POA: Diagnosis not present

## 2016-08-23 DIAGNOSIS — M62561 Muscle wasting and atrophy, not elsewhere classified, right lower leg: Secondary | ICD-10-CM | POA: Diagnosis not present

## 2016-08-23 DIAGNOSIS — I480 Paroxysmal atrial fibrillation: Secondary | ICD-10-CM | POA: Diagnosis not present

## 2016-08-23 DIAGNOSIS — R296 Repeated falls: Secondary | ICD-10-CM | POA: Diagnosis not present

## 2016-08-24 DIAGNOSIS — R296 Repeated falls: Secondary | ICD-10-CM | POA: Diagnosis not present

## 2016-08-24 DIAGNOSIS — R278 Other lack of coordination: Secondary | ICD-10-CM | POA: Diagnosis not present

## 2016-08-24 DIAGNOSIS — M62562 Muscle wasting and atrophy, not elsewhere classified, left lower leg: Secondary | ICD-10-CM | POA: Diagnosis not present

## 2016-08-24 DIAGNOSIS — M62561 Muscle wasting and atrophy, not elsewhere classified, right lower leg: Secondary | ICD-10-CM | POA: Diagnosis not present

## 2016-08-24 DIAGNOSIS — I480 Paroxysmal atrial fibrillation: Secondary | ICD-10-CM | POA: Diagnosis not present

## 2016-08-24 DIAGNOSIS — R2689 Other abnormalities of gait and mobility: Secondary | ICD-10-CM | POA: Diagnosis not present

## 2016-08-27 DIAGNOSIS — R296 Repeated falls: Secondary | ICD-10-CM | POA: Diagnosis not present

## 2016-08-27 DIAGNOSIS — M62561 Muscle wasting and atrophy, not elsewhere classified, right lower leg: Secondary | ICD-10-CM | POA: Diagnosis not present

## 2016-08-27 DIAGNOSIS — R2689 Other abnormalities of gait and mobility: Secondary | ICD-10-CM | POA: Diagnosis not present

## 2016-08-27 DIAGNOSIS — I480 Paroxysmal atrial fibrillation: Secondary | ICD-10-CM | POA: Diagnosis not present

## 2016-08-27 DIAGNOSIS — M62562 Muscle wasting and atrophy, not elsewhere classified, left lower leg: Secondary | ICD-10-CM | POA: Diagnosis not present

## 2016-08-27 DIAGNOSIS — R278 Other lack of coordination: Secondary | ICD-10-CM | POA: Diagnosis not present

## 2016-08-28 DIAGNOSIS — M62562 Muscle wasting and atrophy, not elsewhere classified, left lower leg: Secondary | ICD-10-CM | POA: Diagnosis not present

## 2016-08-28 DIAGNOSIS — R296 Repeated falls: Secondary | ICD-10-CM | POA: Diagnosis not present

## 2016-08-28 DIAGNOSIS — R278 Other lack of coordination: Secondary | ICD-10-CM | POA: Diagnosis not present

## 2016-08-28 DIAGNOSIS — I480 Paroxysmal atrial fibrillation: Secondary | ICD-10-CM | POA: Diagnosis not present

## 2016-08-28 DIAGNOSIS — M62561 Muscle wasting and atrophy, not elsewhere classified, right lower leg: Secondary | ICD-10-CM | POA: Diagnosis not present

## 2016-08-28 DIAGNOSIS — R2689 Other abnormalities of gait and mobility: Secondary | ICD-10-CM | POA: Diagnosis not present

## 2016-08-29 DIAGNOSIS — M62562 Muscle wasting and atrophy, not elsewhere classified, left lower leg: Secondary | ICD-10-CM | POA: Diagnosis not present

## 2016-08-29 DIAGNOSIS — M62561 Muscle wasting and atrophy, not elsewhere classified, right lower leg: Secondary | ICD-10-CM | POA: Diagnosis not present

## 2016-08-29 DIAGNOSIS — I480 Paroxysmal atrial fibrillation: Secondary | ICD-10-CM | POA: Diagnosis not present

## 2016-08-29 DIAGNOSIS — R278 Other lack of coordination: Secondary | ICD-10-CM | POA: Diagnosis not present

## 2016-08-29 DIAGNOSIS — R2689 Other abnormalities of gait and mobility: Secondary | ICD-10-CM | POA: Diagnosis not present

## 2016-08-29 DIAGNOSIS — R296 Repeated falls: Secondary | ICD-10-CM | POA: Diagnosis not present

## 2016-08-30 ENCOUNTER — Non-Acute Institutional Stay (SKILLED_NURSING_FACILITY): Payer: Medicare Other | Admitting: Adult Health

## 2016-08-30 DIAGNOSIS — I10 Essential (primary) hypertension: Secondary | ICD-10-CM

## 2016-08-30 DIAGNOSIS — R278 Other lack of coordination: Secondary | ICD-10-CM | POA: Diagnosis not present

## 2016-08-30 DIAGNOSIS — K219 Gastro-esophageal reflux disease without esophagitis: Secondary | ICD-10-CM

## 2016-08-30 DIAGNOSIS — R42 Dizziness and giddiness: Secondary | ICD-10-CM | POA: Diagnosis not present

## 2016-08-30 DIAGNOSIS — I350 Nonrheumatic aortic (valve) stenosis: Secondary | ICD-10-CM | POA: Diagnosis not present

## 2016-08-30 DIAGNOSIS — R296 Repeated falls: Secondary | ICD-10-CM | POA: Diagnosis not present

## 2016-08-30 DIAGNOSIS — M62561 Muscle wasting and atrophy, not elsewhere classified, right lower leg: Secondary | ICD-10-CM | POA: Diagnosis not present

## 2016-08-30 DIAGNOSIS — E034 Atrophy of thyroid (acquired): Secondary | ICD-10-CM | POA: Diagnosis not present

## 2016-08-30 DIAGNOSIS — I480 Paroxysmal atrial fibrillation: Secondary | ICD-10-CM

## 2016-08-30 DIAGNOSIS — M62562 Muscle wasting and atrophy, not elsewhere classified, left lower leg: Secondary | ICD-10-CM | POA: Diagnosis not present

## 2016-08-30 DIAGNOSIS — R2689 Other abnormalities of gait and mobility: Secondary | ICD-10-CM | POA: Diagnosis not present

## 2016-08-31 DIAGNOSIS — I480 Paroxysmal atrial fibrillation: Secondary | ICD-10-CM | POA: Diagnosis not present

## 2016-08-31 DIAGNOSIS — M62561 Muscle wasting and atrophy, not elsewhere classified, right lower leg: Secondary | ICD-10-CM | POA: Diagnosis not present

## 2016-08-31 DIAGNOSIS — R296 Repeated falls: Secondary | ICD-10-CM | POA: Diagnosis not present

## 2016-08-31 DIAGNOSIS — R2689 Other abnormalities of gait and mobility: Secondary | ICD-10-CM | POA: Diagnosis not present

## 2016-08-31 DIAGNOSIS — M62562 Muscle wasting and atrophy, not elsewhere classified, left lower leg: Secondary | ICD-10-CM | POA: Diagnosis not present

## 2016-08-31 DIAGNOSIS — R278 Other lack of coordination: Secondary | ICD-10-CM | POA: Diagnosis not present

## 2016-08-31 NOTE — Progress Notes (Signed)
Location:  [facility]up[facility] of Service:  SNF (31)  Provider:  Cindi Carbon, [place] [phone]   PCP: Nyoka Cowden, MD Patient Care Team: Marletta Lor, MD as PCP - General (Internal Medicine)  Extended Emergency Contact Information Primary Emergency Contact: King,Marian Address: [address] [place] [address] Johnnette Litter of [place] Phone: [phone] Relation: Daughter Secondary Emergency Contact: Christiana Fuchs States of [place] Phone: [phone] Mobile Phone: [phone] Relation: Daughter   Allergies  Allergen Reactions  . Penicillins Diarrhea    Has patient had a PCN reaction causing immediate rash, facial/tongue/throat swelling, SOB or lightheadedness with hypotension: YES Has patient had a PCN reaction causing severe rash involving mucus membranes or skin necrosis:NO Has patient had a PCN reaction that required hospitalization NO Has patient had a PCN reaction occurring within the last 10 years: NO If all of the above answers are "NO", then may proceed with Cephalosporin use.  . Ace Inhibitors     REACTION: cough  . Hydrocodone     REACTION: Hallucinations---minor    Chief Complaint  Patient presents with  . Discharge Note    HPI:  83 y.o. female seen for discharge from [facility]. She was admitted on 08/18/16 for dizziness, elevated BP, and sinus headache.   She last saw Dr. Burnice Logan on 1/16 for htn and cervicogenic headache (left posterior neck).  She had a fall in January and CT head showed right paranasal sinus disease.  She had improvement in headaches with tylenol and a one week course of claritin.    She saw PA Kilroy with cards on 08/15/16.  He recommended an event monitor for concerns that an arrhythmia may be causing the dizziness. She has h/o moderate AS per last echo 9/17 and normal EF.  She has PAF.   She reports that she has not had any  dizziness in 5 days and she feels that claritin and increased water intake are the reason.  She is dressing, clothing, feeding, and bathing herself without assistance. She reports normal appetite and BMs.  There is no chest pain, DOE, congestion, or edema. [place] to return to [place] but is interested in [place] for the future.  She also requested to learn more about home care services for the future.  Her daughter is with her and will help her move back home.    Her BP has been elevated at times ranging 141-166/71-87.  However, this is improved from admission where one reading was as high as 200/98.  She reports that may have been due to the use of decongestants.  Therapy has evaluated her and fell that she is ready to return to [place].  They were not able to provoke the dizziness during testing.   Past Medical History:  Diagnosis Date  . Aortic stenosis   . Atrial fibrillation, new onset ([place])   . CARCINOMA, BLADDER, TRANSITIONAL CELL 10/24/2007  . GERD (gastroesophageal reflux disease)   . Heart murmur   . HYPERTENSION 01/07/2007  . HYPOTHYROIDISM 01/07/2007  . IBS 05/03/2008  . MIXED HEARING LOSS BILATERAL 09/28/2009  . OSTEOARTHRITIS, KNEE 04/25/2007  . UNSPECIFIED ARTHROPATHY MULTIPLE SITES 10/26/2009    Past Surgical History:  Procedure Laterality Date  . BELPHAROPTOSIS REPAIR    . BLADDER TUMOR EXCISION    . BUNIONECTOMY     bilateral  . CATARACT EXTRACTION     bilateral  .  TONSILLECTOMY AND ADENOIDECTOMY     x2      reports that she quit smoking about 43 years ago. Her smoking use included Cigarettes. She has a 10.00 pack-year smoking history. She has never used smokeless tobacco. She reports that she drinks alcohol. She reports that she does not use drugs. Social History   Social History  . Marital status: Widowed    Spouse name: N/A  . Number of children: 3  . Years of education: N/A   Occupational History  . retired Retired   Social History Main Topics  .  Smoking status: Former Smoker    Packs/day: 0.50    Years: 20.00    Types: Cigarettes    Quit date: 09/05/1973  . Smokeless tobacco: Never Used  . Alcohol use Yes     Comment: ocass  . Drug use: No  . Sexual activity: Not on file   Other Topics Concern  . Not on file   Social History Narrative  . No narrative on file   Functional Status Survey:    Allergies  Allergen Reactions  . Penicillins Diarrhea    Has patient had a PCN reaction causing immediate rash, facial/tongue/throat swelling, SOB or lightheadedness with hypotension: YES Has patient had a PCN reaction causing severe rash involving mucus membranes or skin necrosis:NO Has patient had a PCN reaction that required hospitalization NO Has patient had a PCN reaction occurring within the last 10 years: NO If all of the above answers are "NO", then may proceed with Cephalosporin use.  . Ace Inhibitors     REACTION: cough  . Hydrocodone     REACTION: Hallucinations---minor    Pertinent  Health Maintenance Due  Topic Date Due  . DEXA SCAN  03/31/1986  . INFLUENZA VACCINE  Completed  . PNA vac Low Risk Adult  Completed    Medications: Allergies as of 08/30/2016      Reactions   Penicillins Diarrhea   Has patient had a PCN reaction causing immediate rash, facial/tongue/throat swelling, SOB or lightheadedness with hypotension: YES Has patient had a PCN reaction causing severe rash involving mucus membranes or skin necrosis:NO Has patient had a PCN reaction that required hospitalization NO Has patient had a PCN reaction occurring within the last 10 years: NO If all of the above answers are "NO", then may proceed with Cephalosporin use.   Ace Inhibitors    REACTION: cough   Hydrocodone    REACTION: Hallucinations---minor      Medication List       Accurate as of 08/30/16 11:59 PM. Always use your most recent med list.          acetaminophen 500 MG tablet Commonly known as:  TYLENOL Take 1,000 mg by mouth  every 6 (six) hours as needed for mild pain. And 1 at bedtime   aspirin 81 MG tablet Take 1 tablet (81 mg total) by mouth Sherbert.   atorvastatin 20 MG tablet Commonly known as:  LIPITOR TAKE 1 TABLET ONCE Carol.   calcium carbonate 500 MG chewable tablet Commonly known as:  TUMS - dosed in mg elemental calcium Chew 1 tablet by mouth as needed.   carvedilol 12.5 MG tablet Commonly known as:  COREG TAKE  (1)  TABLET TWICE A DAY WITH MEALS (BREAKFAST AND SUPPER)   famotidine 20 MG tablet Commonly known as:  PEPCID TAKE 1 TABLET TWICE Riga.   glucosamine-chondroitin 500-400 MG tablet Take 1 tablet by mouth Safley as needed. For bones and joints  hydrocortisone 2.5 % rectal cream Commonly known as:  ANUSOL-HC Place 1 application rectally 2 (two) times Brunetto as needed.   levothyroxine 75 MCG tablet Commonly known as:  SYNTHROID, LEVOTHROID Take 0.5 tablets (37.5 mcg total) by mouth Pecina before breakfast.   loperamide 2 MG tablet Commonly known as:  IMODIUM A-D Take 4 mg by mouth as needed.   multivitamin tablet Take 2 tablets by mouth Rudder.       Review of Systems  Constitutional: Negative for activity change, appetite change, chills, diaphoresis, fatigue, fever and unexpected weight change.  HENT: Negative for congestion.   Respiratory: Negative for cough, shortness of breath and wheezing.   Cardiovascular: Negative for chest pain, palpitations and leg swelling.  Gastrointestinal: Negative for abdominal distention, abdominal pain, constipation and diarrhea.  Genitourinary: Negative for difficulty urinating and dysuria.  Musculoskeletal: Negative for arthralgias, back pain, gait problem, joint swelling and myalgias.  Neurological: Negative for dizziness, tremors, seizures, syncope, facial asymmetry, speech difficulty, weakness, light-headedness, numbness and headaches.  Psychiatric/Behavioral: Negative for agitation, behavioral problems and confusion.    Vitals:    08/30/16 1642  BP: (!) 163/75  Pulse: 75  Resp: 20  Temp: 97.7 F (36.5 C)  SpO2: 98%  Weight: 150 lb (68 kg)   Body mass index is 28.34 kg/m. Physical Exam  Constitutional: She is oriented to person, place, and time. No distress.  HENT:  Head: Normocephalic and atraumatic.  Right Ear: External ear normal.  Left Ear: External ear normal.  Mouth/Throat: Oropharynx is clear and moist. No oropharyngeal exudate.  Eyes: Conjunctivae are normal. Pupils are equal, round, and reactive to light. Right eye exhibits no discharge. Left eye exhibits no discharge.  Neck: No JVD present.  Cardiovascular: Normal rate and regular rhythm.   Murmur: systolic 99991111. Pulmonary/Chest: Effort normal and breath sounds normal. No respiratory distress. She has no wheezes.  Abdominal: Soft. Bowel sounds are normal.  Musculoskeletal: She exhibits no edema or tenderness.  Neurological: She is alert and oriented to person, place, and time.  Skin: Skin is warm and dry. She is not diaphoretic.  Psychiatric: She has a normal mood and affect.  Nursing note and vitals reviewed.   Labs reviewed: Basic Metabolic Panel:  Recent Labs  02/06/16 1441 03/14/16 1310 03/15/16 0303 08/22/16  NA 144 142 144 145  K 3.7 3.5 3.6 4.0  CL 107 109 110  --   CO2 28 25 27   --   GLUCOSE 133* 128* 89  --   BUN 14 14 13 11   CREATININE 0.74 0.78 0.69 0.7  CALCIUM 9.5 9.1 9.0  --    Liver Function Tests:  Recent Labs  02/06/16 1441  AST 14  ALT 11  ALKPHOS 78  BILITOT 1.0  PROT 6.2  ALBUMIN 3.9   No results for input(s): LIPASE, AMYLASE in the last 8760 hours. No results for input(s): AMMONIA in the last 8760 hours. CBC:  Recent Labs  02/06/16 1441 03/14/16 1310 03/15/16 0303 03/16/16 0532 08/22/16  WBC 8.8 12.0* 9.6 9.6 6.9  NEUTROABS 5.5 9.9*  --   --   --   HGB 11.5* 12.1 10.5* 10.8* 11.7*  HCT 34.9* 39.2 34.0* 35.3* 37  MCV 86.4 92.0 91.4 92.2  --   PLT 229.0 207 197 182 161   Cardiac  Enzymes:  Recent Labs  03/14/16 1823 03/14/16 2205 03/15/16 0523  TROPONINI 0.68* 1.08* 1.01*   BNP: Invalid input(s): POCBNP CBG: No results for input(s): GLUCAP in the last 8760  hours.  Procedures and Imaging Studies During Stay: No results found.  Assessment/Plan:    1. Dizziness Resolved ? If the symptoms were due to poor oral intake vs. Sinus dz I asked the staff to move forward with coordination of event monitor with cardiology  2. Essential hypertension Slightly elevated but would not tx given her age, AS, and hx of dizziness Continue current meds and f/u with Cardiology as ordered Also has apt with PCP next month  3. Hypothyroidism due to acquired atrophy of thyroid Continue Sythroid 37.5 mcg qd  4. Gastroesophageal reflux disease without esophagitis Continue pepcid 20 mg BID  5. Paroxysmal atrial fibrillation (HCC) Rate regular on exam Remains on baby asp, takes Coreg Wilbert No s/s of CHF  6. Aortic valve stenosis, etiology of cardiac valve disease unspecified Noted murmur, no syncope Medical management per cards     Patient is being discharged with the following home health services:  NA  Patient is being discharged with the following durable medical equipment:  NA Patient has been advised to f/u with their PCP in 1-2 weeks to for a transitions of care visit.  Social services at their facility was responsible for arranging this appointment.  Pt was provided with adequate prescriptions of noncontrolled medications to reach the scheduled appointment .  For controlled substances, a limited supply was provided as appropriate for the individual patient.  If the pt normally receives these medications from a pain clinic or has a contract with another physician, these medications should be received from that clinic or physician only).

## 2016-09-02 DIAGNOSIS — R278 Other lack of coordination: Secondary | ICD-10-CM | POA: Diagnosis not present

## 2016-09-02 DIAGNOSIS — R296 Repeated falls: Secondary | ICD-10-CM | POA: Diagnosis not present

## 2016-09-02 DIAGNOSIS — R2689 Other abnormalities of gait and mobility: Secondary | ICD-10-CM | POA: Diagnosis not present

## 2016-09-02 DIAGNOSIS — M62562 Muscle wasting and atrophy, not elsewhere classified, left lower leg: Secondary | ICD-10-CM | POA: Diagnosis not present

## 2016-09-02 DIAGNOSIS — I480 Paroxysmal atrial fibrillation: Secondary | ICD-10-CM | POA: Diagnosis not present

## 2016-09-02 DIAGNOSIS — M62561 Muscle wasting and atrophy, not elsewhere classified, right lower leg: Secondary | ICD-10-CM | POA: Diagnosis not present

## 2016-09-03 ENCOUNTER — Telehealth: Payer: Self-pay | Admitting: Internal Medicine

## 2016-09-03 DIAGNOSIS — R296 Repeated falls: Secondary | ICD-10-CM | POA: Diagnosis not present

## 2016-09-03 DIAGNOSIS — R2689 Other abnormalities of gait and mobility: Secondary | ICD-10-CM | POA: Diagnosis not present

## 2016-09-03 DIAGNOSIS — M62561 Muscle wasting and atrophy, not elsewhere classified, right lower leg: Secondary | ICD-10-CM | POA: Diagnosis not present

## 2016-09-03 DIAGNOSIS — I480 Paroxysmal atrial fibrillation: Secondary | ICD-10-CM | POA: Diagnosis not present

## 2016-09-03 DIAGNOSIS — M62562 Muscle wasting and atrophy, not elsewhere classified, left lower leg: Secondary | ICD-10-CM | POA: Diagnosis not present

## 2016-09-03 DIAGNOSIS — R278 Other lack of coordination: Secondary | ICD-10-CM | POA: Diagnosis not present

## 2016-09-03 NOTE — Telephone Encounter (Signed)
Noted  

## 2016-09-03 NOTE — Telephone Encounter (Signed)
Pt went home on Friday and returned on Saturday and pt felt that she is not able to leave by herself and when back to the facility as a permanent move and will have her own room when one comes available.

## 2016-09-04 DIAGNOSIS — I480 Paroxysmal atrial fibrillation: Secondary | ICD-10-CM | POA: Diagnosis not present

## 2016-09-04 DIAGNOSIS — R296 Repeated falls: Secondary | ICD-10-CM | POA: Diagnosis not present

## 2016-09-04 DIAGNOSIS — R278 Other lack of coordination: Secondary | ICD-10-CM | POA: Diagnosis not present

## 2016-09-04 DIAGNOSIS — M62562 Muscle wasting and atrophy, not elsewhere classified, left lower leg: Secondary | ICD-10-CM | POA: Diagnosis not present

## 2016-09-04 DIAGNOSIS — M62561 Muscle wasting and atrophy, not elsewhere classified, right lower leg: Secondary | ICD-10-CM | POA: Diagnosis not present

## 2016-09-04 DIAGNOSIS — R2689 Other abnormalities of gait and mobility: Secondary | ICD-10-CM | POA: Diagnosis not present

## 2016-09-05 DIAGNOSIS — R278 Other lack of coordination: Secondary | ICD-10-CM | POA: Diagnosis not present

## 2016-09-05 DIAGNOSIS — R2689 Other abnormalities of gait and mobility: Secondary | ICD-10-CM | POA: Diagnosis not present

## 2016-09-05 DIAGNOSIS — R296 Repeated falls: Secondary | ICD-10-CM | POA: Diagnosis not present

## 2016-09-05 DIAGNOSIS — M62562 Muscle wasting and atrophy, not elsewhere classified, left lower leg: Secondary | ICD-10-CM | POA: Diagnosis not present

## 2016-09-05 DIAGNOSIS — I480 Paroxysmal atrial fibrillation: Secondary | ICD-10-CM | POA: Diagnosis not present

## 2016-09-05 DIAGNOSIS — M62561 Muscle wasting and atrophy, not elsewhere classified, right lower leg: Secondary | ICD-10-CM | POA: Diagnosis not present

## 2016-09-07 DIAGNOSIS — I480 Paroxysmal atrial fibrillation: Secondary | ICD-10-CM | POA: Diagnosis not present

## 2016-09-07 DIAGNOSIS — R278 Other lack of coordination: Secondary | ICD-10-CM | POA: Diagnosis not present

## 2016-09-07 DIAGNOSIS — R42 Dizziness and giddiness: Secondary | ICD-10-CM | POA: Diagnosis not present

## 2016-09-07 DIAGNOSIS — R296 Repeated falls: Secondary | ICD-10-CM | POA: Diagnosis not present

## 2016-09-10 DIAGNOSIS — R296 Repeated falls: Secondary | ICD-10-CM | POA: Diagnosis not present

## 2016-09-10 DIAGNOSIS — R42 Dizziness and giddiness: Secondary | ICD-10-CM | POA: Diagnosis not present

## 2016-09-10 DIAGNOSIS — R278 Other lack of coordination: Secondary | ICD-10-CM | POA: Diagnosis not present

## 2016-09-10 DIAGNOSIS — I480 Paroxysmal atrial fibrillation: Secondary | ICD-10-CM | POA: Diagnosis not present

## 2016-09-12 DIAGNOSIS — I480 Paroxysmal atrial fibrillation: Secondary | ICD-10-CM | POA: Diagnosis not present

## 2016-09-12 DIAGNOSIS — R296 Repeated falls: Secondary | ICD-10-CM | POA: Diagnosis not present

## 2016-09-12 DIAGNOSIS — R42 Dizziness and giddiness: Secondary | ICD-10-CM | POA: Diagnosis not present

## 2016-09-12 DIAGNOSIS — R278 Other lack of coordination: Secondary | ICD-10-CM | POA: Diagnosis not present

## 2016-09-13 DIAGNOSIS — R296 Repeated falls: Secondary | ICD-10-CM | POA: Diagnosis not present

## 2016-09-13 DIAGNOSIS — R42 Dizziness and giddiness: Secondary | ICD-10-CM | POA: Diagnosis not present

## 2016-09-13 DIAGNOSIS — R278 Other lack of coordination: Secondary | ICD-10-CM | POA: Diagnosis not present

## 2016-09-13 DIAGNOSIS — I480 Paroxysmal atrial fibrillation: Secondary | ICD-10-CM | POA: Diagnosis not present

## 2016-09-14 ENCOUNTER — Ambulatory Visit (INDEPENDENT_AMBULATORY_CARE_PROVIDER_SITE_OTHER): Payer: Medicare Other

## 2016-09-14 DIAGNOSIS — R42 Dizziness and giddiness: Secondary | ICD-10-CM | POA: Diagnosis not present

## 2016-09-17 DIAGNOSIS — R278 Other lack of coordination: Secondary | ICD-10-CM | POA: Diagnosis not present

## 2016-09-17 DIAGNOSIS — R42 Dizziness and giddiness: Secondary | ICD-10-CM | POA: Diagnosis not present

## 2016-09-17 DIAGNOSIS — I480 Paroxysmal atrial fibrillation: Secondary | ICD-10-CM | POA: Diagnosis not present

## 2016-09-17 DIAGNOSIS — R296 Repeated falls: Secondary | ICD-10-CM | POA: Diagnosis not present

## 2016-09-18 DIAGNOSIS — I480 Paroxysmal atrial fibrillation: Secondary | ICD-10-CM | POA: Diagnosis not present

## 2016-09-18 DIAGNOSIS — R296 Repeated falls: Secondary | ICD-10-CM | POA: Diagnosis not present

## 2016-09-18 DIAGNOSIS — R42 Dizziness and giddiness: Secondary | ICD-10-CM | POA: Diagnosis not present

## 2016-09-18 DIAGNOSIS — R278 Other lack of coordination: Secondary | ICD-10-CM | POA: Diagnosis not present

## 2016-09-19 ENCOUNTER — Ambulatory Visit: Payer: Medicare Other | Admitting: Cardiology

## 2016-09-20 DIAGNOSIS — R296 Repeated falls: Secondary | ICD-10-CM | POA: Diagnosis not present

## 2016-09-20 DIAGNOSIS — R42 Dizziness and giddiness: Secondary | ICD-10-CM | POA: Diagnosis not present

## 2016-09-20 DIAGNOSIS — R278 Other lack of coordination: Secondary | ICD-10-CM | POA: Diagnosis not present

## 2016-09-20 DIAGNOSIS — I480 Paroxysmal atrial fibrillation: Secondary | ICD-10-CM | POA: Diagnosis not present

## 2016-09-21 DIAGNOSIS — R42 Dizziness and giddiness: Secondary | ICD-10-CM | POA: Diagnosis not present

## 2016-09-21 DIAGNOSIS — I480 Paroxysmal atrial fibrillation: Secondary | ICD-10-CM | POA: Diagnosis not present

## 2016-09-21 DIAGNOSIS — R278 Other lack of coordination: Secondary | ICD-10-CM | POA: Diagnosis not present

## 2016-09-21 DIAGNOSIS — R296 Repeated falls: Secondary | ICD-10-CM | POA: Diagnosis not present

## 2016-09-24 DIAGNOSIS — R296 Repeated falls: Secondary | ICD-10-CM | POA: Diagnosis not present

## 2016-09-24 DIAGNOSIS — R278 Other lack of coordination: Secondary | ICD-10-CM | POA: Diagnosis not present

## 2016-09-24 DIAGNOSIS — I480 Paroxysmal atrial fibrillation: Secondary | ICD-10-CM | POA: Diagnosis not present

## 2016-09-24 DIAGNOSIS — R42 Dizziness and giddiness: Secondary | ICD-10-CM | POA: Diagnosis not present

## 2016-09-25 DIAGNOSIS — I480 Paroxysmal atrial fibrillation: Secondary | ICD-10-CM | POA: Diagnosis not present

## 2016-09-25 DIAGNOSIS — R42 Dizziness and giddiness: Secondary | ICD-10-CM | POA: Diagnosis not present

## 2016-09-25 DIAGNOSIS — R296 Repeated falls: Secondary | ICD-10-CM | POA: Diagnosis not present

## 2016-09-25 DIAGNOSIS — R278 Other lack of coordination: Secondary | ICD-10-CM | POA: Diagnosis not present

## 2016-09-27 DIAGNOSIS — R296 Repeated falls: Secondary | ICD-10-CM | POA: Diagnosis not present

## 2016-09-27 DIAGNOSIS — I480 Paroxysmal atrial fibrillation: Secondary | ICD-10-CM | POA: Diagnosis not present

## 2016-09-27 DIAGNOSIS — R278 Other lack of coordination: Secondary | ICD-10-CM | POA: Diagnosis not present

## 2016-09-27 DIAGNOSIS — R42 Dizziness and giddiness: Secondary | ICD-10-CM | POA: Diagnosis not present

## 2016-09-28 DIAGNOSIS — I480 Paroxysmal atrial fibrillation: Secondary | ICD-10-CM | POA: Diagnosis not present

## 2016-09-28 DIAGNOSIS — R278 Other lack of coordination: Secondary | ICD-10-CM | POA: Diagnosis not present

## 2016-09-28 DIAGNOSIS — R42 Dizziness and giddiness: Secondary | ICD-10-CM | POA: Diagnosis not present

## 2016-09-28 DIAGNOSIS — R296 Repeated falls: Secondary | ICD-10-CM | POA: Diagnosis not present

## 2016-10-01 DIAGNOSIS — R278 Other lack of coordination: Secondary | ICD-10-CM | POA: Diagnosis not present

## 2016-10-01 DIAGNOSIS — I480 Paroxysmal atrial fibrillation: Secondary | ICD-10-CM | POA: Diagnosis not present

## 2016-10-01 DIAGNOSIS — R42 Dizziness and giddiness: Secondary | ICD-10-CM | POA: Diagnosis not present

## 2016-10-01 DIAGNOSIS — R296 Repeated falls: Secondary | ICD-10-CM | POA: Diagnosis not present

## 2016-10-02 DIAGNOSIS — I480 Paroxysmal atrial fibrillation: Secondary | ICD-10-CM | POA: Diagnosis not present

## 2016-10-02 DIAGNOSIS — R278 Other lack of coordination: Secondary | ICD-10-CM | POA: Diagnosis not present

## 2016-10-02 DIAGNOSIS — R42 Dizziness and giddiness: Secondary | ICD-10-CM | POA: Diagnosis not present

## 2016-10-02 DIAGNOSIS — R296 Repeated falls: Secondary | ICD-10-CM | POA: Diagnosis not present

## 2016-10-04 ENCOUNTER — Encounter: Payer: Self-pay | Admitting: Cardiology

## 2016-10-04 DIAGNOSIS — R278 Other lack of coordination: Secondary | ICD-10-CM | POA: Diagnosis not present

## 2016-10-04 DIAGNOSIS — R296 Repeated falls: Secondary | ICD-10-CM | POA: Diagnosis not present

## 2016-10-04 DIAGNOSIS — R42 Dizziness and giddiness: Secondary | ICD-10-CM | POA: Diagnosis not present

## 2016-10-04 DIAGNOSIS — I480 Paroxysmal atrial fibrillation: Secondary | ICD-10-CM | POA: Diagnosis not present

## 2016-10-09 DIAGNOSIS — R42 Dizziness and giddiness: Secondary | ICD-10-CM | POA: Diagnosis not present

## 2016-10-09 DIAGNOSIS — R296 Repeated falls: Secondary | ICD-10-CM | POA: Diagnosis not present

## 2016-10-09 DIAGNOSIS — I480 Paroxysmal atrial fibrillation: Secondary | ICD-10-CM | POA: Diagnosis not present

## 2016-10-09 DIAGNOSIS — R278 Other lack of coordination: Secondary | ICD-10-CM | POA: Diagnosis not present

## 2016-10-11 DIAGNOSIS — R42 Dizziness and giddiness: Secondary | ICD-10-CM | POA: Diagnosis not present

## 2016-10-11 DIAGNOSIS — R296 Repeated falls: Secondary | ICD-10-CM | POA: Diagnosis not present

## 2016-10-11 DIAGNOSIS — I480 Paroxysmal atrial fibrillation: Secondary | ICD-10-CM | POA: Diagnosis not present

## 2016-10-11 DIAGNOSIS — R278 Other lack of coordination: Secondary | ICD-10-CM | POA: Diagnosis not present

## 2016-10-14 DIAGNOSIS — R296 Repeated falls: Secondary | ICD-10-CM | POA: Diagnosis not present

## 2016-10-14 DIAGNOSIS — I480 Paroxysmal atrial fibrillation: Secondary | ICD-10-CM | POA: Diagnosis not present

## 2016-10-14 DIAGNOSIS — R278 Other lack of coordination: Secondary | ICD-10-CM | POA: Diagnosis not present

## 2016-10-14 DIAGNOSIS — R42 Dizziness and giddiness: Secondary | ICD-10-CM | POA: Diagnosis not present

## 2016-10-16 DIAGNOSIS — R296 Repeated falls: Secondary | ICD-10-CM | POA: Diagnosis not present

## 2016-10-16 DIAGNOSIS — R278 Other lack of coordination: Secondary | ICD-10-CM | POA: Diagnosis not present

## 2016-10-16 DIAGNOSIS — R42 Dizziness and giddiness: Secondary | ICD-10-CM | POA: Diagnosis not present

## 2016-10-16 DIAGNOSIS — I480 Paroxysmal atrial fibrillation: Secondary | ICD-10-CM | POA: Diagnosis not present

## 2016-10-16 NOTE — Progress Notes (Signed)
HPI The patient presents for follow up of AS and PAF.  Since I last saw her she has been in the hospital with PAF.  She was seen earlier this year with dizziness.   Since she was last seen she's done well. She has occasional dizziness but not like it was. She stays hydrated. She does use a walker and a wheelchair. She does fall occasionally and we talked about precautions. This is why she is not on anticoagulation. She's not felt any palpitations like she did fibrillation. She's not having any chest pressure, neck or arm discomfort. She's not having shortness of breath, PND or orthopnea. She's had no weight gain or edema.   Allergies  Allergen Reactions  . Penicillins Diarrhea    Has patient had a PCN reaction causing immediate rash, facial/tongue/throat swelling, SOB or lightheadedness with hypotension: YES Has patient had a PCN reaction causing severe rash involving mucus membranes or skin necrosis:NO Has patient had a PCN reaction that required hospitalization NO Has patient had a PCN reaction occurring within the last 10 years: NO If all of the above answers are "NO", then may proceed with Cephalosporin use.  . Ace Inhibitors     REACTION: cough  . Hydrocodone     REACTION: Hallucinations---minor    Current Outpatient Prescriptions  Medication Sig Dispense Refill  . acetaminophen (TYLENOL) 500 MG tablet Take 1,000 mg by mouth every 6 (six) hours as needed for mild pain. And 1 at bedtime     . aspirin 81 MG tablet Take 1 tablet (81 mg total) by mouth Claire. 30 tablet   . atorvastatin (LIPITOR) 20 MG tablet TAKE 1 TABLET ONCE Hagger. 90 tablet 0  . calcium carbonate (TUMS) 500 MG chewable tablet Chew 1 tablet by mouth as needed.      . carvedilol (COREG) 12.5 MG tablet TAKE  (1)  TABLET TWICE A DAY WITH MEALS (BREAKFAST AND SUPPER) 180 tablet 0  . famotidine (PEPCID) 20 MG tablet TAKE 1 TABLET TWICE Hine. 60 tablet 0  . hydrocortisone (ANUSOL-HC) 2.5 % rectal cream Place 1  application rectally 2 (two) times Scriven as needed.    Marland Kitchen levothyroxine (SYNTHROID, LEVOTHROID) 75 MCG tablet Take 0.5 tablets (37.5 mcg total) by mouth Mcelroy before breakfast. 30 tablet 0  . loperamide (IMODIUM A-D) 2 MG tablet Take 4 mg by mouth as needed.     . Multiple Vitamin (MULTIVITAMIN) tablet Take 2 tablets by mouth Salonga.     Marland Kitchen glucosamine-chondroitin 500-400 MG tablet Take 1 tablet by mouth Sposito as needed. For bones and joints     No current facility-administered medications for this visit.     Past Medical History:  Diagnosis Date  . Aortic stenosis   . Atrial fibrillation, new onset ([place])   . CARCINOMA, BLADDER, TRANSITIONAL CELL 10/24/2007  . GERD (gastroesophageal reflux disease)   . Heart murmur   . HYPERTENSION 01/07/2007  . HYPOTHYROIDISM 01/07/2007  . IBS 05/03/2008  . MIXED HEARING LOSS BILATERAL 09/28/2009  . OSTEOARTHRITIS, KNEE 04/25/2007  . UNSPECIFIED ARTHROPATHY MULTIPLE SITES 10/26/2009    Past Surgical History:  Procedure Laterality Date  . BELPHAROPTOSIS REPAIR    . BLADDER TUMOR EXCISION    . BUNIONECTOMY     bilateral  . CATARACT EXTRACTION     bilateral  . TONSILLECTOMY AND ADENOIDECTOMY     x2    ROS:   As stated in the HPI and negative for all other systems.  PHYSICAL EXAM BP (!) 150/76  Pulse 73   Ht 5\' 1"  (1.549 m)   Wt 152 lb (68.9 kg)   BMI 28.72 kg/m  GENERAL:  Well appearing for her age and no distress NECK:  No jugular venous distention at 90 degrees, waveform within normal limits, carotid upstroke brisk and symmetric, bilateral bruits, no thyromegaly LUNGS:  Clear to auscultation bilaterally CHEST:  Unremarkable HEART:  PMI not displaced or sustained,S1 and S2 within normal limits, no S3, no S4, no clicks, no rubs, apical systolic murmur mid to late peaking radiating out the outflow tract, unchanged from previous ABD:  Flat, positive bowel sounds normal in frequency in pich, no bruits, no rebound, no guarding, otherwise unable to  examine as the patient is in a wheelchair.  EXT:  2 plus pulses throughout, no edema, no cyanosis no clubbing   ASSESSMENT AND PLAN   Aortic stenosis -  The patient has no new symptoms. No change in therapy is indicated. She certainly would be medical management only.  She wants the most conservative therapy.  HTN - The blood pressure is at target. No change in medications is indicated.   PAF - Ms. Jovie Swanner Espy has a CHA2DS2 - VASc score of 4 with a risk of stroke of 4%.  We discussed this again today and she is at risk for falls and in fact fell last night and so we are not using anticoagulation.  She has worn an event monitor.  I reviewed this and there was no atrial fib but there were brief runs of atrial tachycardia.

## 2016-10-17 ENCOUNTER — Ambulatory Visit: Payer: Medicare Other | Admitting: Cardiology

## 2016-10-17 ENCOUNTER — Ambulatory Visit (INDEPENDENT_AMBULATORY_CARE_PROVIDER_SITE_OTHER): Payer: Medicare Other | Admitting: Cardiology

## 2016-10-17 ENCOUNTER — Encounter: Payer: Self-pay | Admitting: Cardiology

## 2016-10-17 VITALS — BP 150/76 | HR 73 | Ht 61.0 in | Wt 152.0 lb

## 2016-10-17 DIAGNOSIS — I480 Paroxysmal atrial fibrillation: Secondary | ICD-10-CM

## 2016-10-17 DIAGNOSIS — I350 Nonrheumatic aortic (valve) stenosis: Secondary | ICD-10-CM

## 2016-10-17 DIAGNOSIS — I10 Essential (primary) hypertension: Secondary | ICD-10-CM | POA: Diagnosis not present

## 2016-10-17 NOTE — Patient Instructions (Signed)

## 2016-10-18 DIAGNOSIS — I480 Paroxysmal atrial fibrillation: Secondary | ICD-10-CM | POA: Diagnosis not present

## 2016-10-18 DIAGNOSIS — R42 Dizziness and giddiness: Secondary | ICD-10-CM | POA: Diagnosis not present

## 2016-10-18 DIAGNOSIS — R278 Other lack of coordination: Secondary | ICD-10-CM | POA: Diagnosis not present

## 2016-10-18 DIAGNOSIS — R296 Repeated falls: Secondary | ICD-10-CM | POA: Diagnosis not present

## 2016-10-19 ENCOUNTER — Encounter: Payer: Self-pay | Admitting: Cardiology

## 2016-10-22 DIAGNOSIS — R278 Other lack of coordination: Secondary | ICD-10-CM | POA: Diagnosis not present

## 2016-10-22 DIAGNOSIS — R296 Repeated falls: Secondary | ICD-10-CM | POA: Diagnosis not present

## 2016-10-22 DIAGNOSIS — I480 Paroxysmal atrial fibrillation: Secondary | ICD-10-CM | POA: Diagnosis not present

## 2016-10-22 DIAGNOSIS — R42 Dizziness and giddiness: Secondary | ICD-10-CM | POA: Diagnosis not present

## 2016-10-23 DIAGNOSIS — R42 Dizziness and giddiness: Secondary | ICD-10-CM | POA: Diagnosis not present

## 2016-10-23 DIAGNOSIS — R296 Repeated falls: Secondary | ICD-10-CM | POA: Diagnosis not present

## 2016-10-23 DIAGNOSIS — R278 Other lack of coordination: Secondary | ICD-10-CM | POA: Diagnosis not present

## 2016-10-23 DIAGNOSIS — I480 Paroxysmal atrial fibrillation: Secondary | ICD-10-CM | POA: Diagnosis not present

## 2016-10-29 DIAGNOSIS — I480 Paroxysmal atrial fibrillation: Secondary | ICD-10-CM | POA: Diagnosis not present

## 2016-10-29 DIAGNOSIS — R278 Other lack of coordination: Secondary | ICD-10-CM | POA: Diagnosis not present

## 2016-10-29 DIAGNOSIS — R296 Repeated falls: Secondary | ICD-10-CM | POA: Diagnosis not present

## 2016-10-29 DIAGNOSIS — R42 Dizziness and giddiness: Secondary | ICD-10-CM | POA: Diagnosis not present

## 2016-10-30 DIAGNOSIS — R278 Other lack of coordination: Secondary | ICD-10-CM | POA: Diagnosis not present

## 2016-10-30 DIAGNOSIS — I480 Paroxysmal atrial fibrillation: Secondary | ICD-10-CM | POA: Diagnosis not present

## 2016-10-30 DIAGNOSIS — R296 Repeated falls: Secondary | ICD-10-CM | POA: Diagnosis not present

## 2016-10-30 DIAGNOSIS — R42 Dizziness and giddiness: Secondary | ICD-10-CM | POA: Diagnosis not present

## 2016-10-31 DIAGNOSIS — R278 Other lack of coordination: Secondary | ICD-10-CM | POA: Diagnosis not present

## 2016-10-31 DIAGNOSIS — R296 Repeated falls: Secondary | ICD-10-CM | POA: Diagnosis not present

## 2016-10-31 DIAGNOSIS — I480 Paroxysmal atrial fibrillation: Secondary | ICD-10-CM | POA: Diagnosis not present

## 2016-10-31 DIAGNOSIS — R42 Dizziness and giddiness: Secondary | ICD-10-CM | POA: Diagnosis not present

## 2016-11-06 DIAGNOSIS — R296 Repeated falls: Secondary | ICD-10-CM | POA: Diagnosis not present

## 2016-11-06 DIAGNOSIS — I480 Paroxysmal atrial fibrillation: Secondary | ICD-10-CM | POA: Diagnosis not present

## 2016-11-06 DIAGNOSIS — R42 Dizziness and giddiness: Secondary | ICD-10-CM | POA: Diagnosis not present

## 2016-11-06 DIAGNOSIS — R278 Other lack of coordination: Secondary | ICD-10-CM | POA: Diagnosis not present

## 2016-11-07 DIAGNOSIS — R296 Repeated falls: Secondary | ICD-10-CM | POA: Diagnosis not present

## 2016-11-07 DIAGNOSIS — R42 Dizziness and giddiness: Secondary | ICD-10-CM | POA: Diagnosis not present

## 2016-11-07 DIAGNOSIS — I480 Paroxysmal atrial fibrillation: Secondary | ICD-10-CM | POA: Diagnosis not present

## 2016-11-07 DIAGNOSIS — R278 Other lack of coordination: Secondary | ICD-10-CM | POA: Diagnosis not present

## 2016-11-08 DIAGNOSIS — I480 Paroxysmal atrial fibrillation: Secondary | ICD-10-CM | POA: Diagnosis not present

## 2016-11-08 DIAGNOSIS — R296 Repeated falls: Secondary | ICD-10-CM | POA: Diagnosis not present

## 2016-11-08 DIAGNOSIS — R42 Dizziness and giddiness: Secondary | ICD-10-CM | POA: Diagnosis not present

## 2016-11-08 DIAGNOSIS — R278 Other lack of coordination: Secondary | ICD-10-CM | POA: Diagnosis not present

## 2016-11-13 DIAGNOSIS — R42 Dizziness and giddiness: Secondary | ICD-10-CM | POA: Diagnosis not present

## 2016-11-13 DIAGNOSIS — R296 Repeated falls: Secondary | ICD-10-CM | POA: Diagnosis not present

## 2016-11-13 DIAGNOSIS — I480 Paroxysmal atrial fibrillation: Secondary | ICD-10-CM | POA: Diagnosis not present

## 2016-11-13 DIAGNOSIS — R278 Other lack of coordination: Secondary | ICD-10-CM | POA: Diagnosis not present

## 2016-11-14 DIAGNOSIS — R278 Other lack of coordination: Secondary | ICD-10-CM | POA: Diagnosis not present

## 2016-11-14 DIAGNOSIS — I480 Paroxysmal atrial fibrillation: Secondary | ICD-10-CM | POA: Diagnosis not present

## 2016-11-14 DIAGNOSIS — R42 Dizziness and giddiness: Secondary | ICD-10-CM | POA: Diagnosis not present

## 2016-11-14 DIAGNOSIS — R296 Repeated falls: Secondary | ICD-10-CM | POA: Diagnosis not present

## 2016-11-19 ENCOUNTER — Encounter: Payer: Self-pay | Admitting: Internal Medicine

## 2016-11-19 ENCOUNTER — Ambulatory Visit (INDEPENDENT_AMBULATORY_CARE_PROVIDER_SITE_OTHER): Payer: Medicare Other | Admitting: Internal Medicine

## 2016-11-19 VITALS — BP 110/70 | HR 65 | Temp 97.6°F | Ht 61.0 in

## 2016-11-19 DIAGNOSIS — E039 Hypothyroidism, unspecified: Secondary | ICD-10-CM

## 2016-11-19 DIAGNOSIS — I10 Essential (primary) hypertension: Secondary | ICD-10-CM | POA: Diagnosis not present

## 2016-11-19 NOTE — Patient Instructions (Addendum)
Lactose-Free Diet, Adult If you have lactose intolerance, you are not able to digest lactose. Lactose is a natural sugar found mainly in milk and milk products. You may need to avoid all foods and beverages that contain lactose. A lactose-free diet can help you do this. What do I need to know about this diet?  Do not consume foods, beverages, vitamins, minerals, or medicines with lactose. Read ingredients lists carefully.  Look for the words "lactose-free" on labels.  Use lactase enzyme drops or tablets as directed by your health care provider.  Use lactose-free milk or a milk alternative, such as soy milk, for drinking and cooking.  Make sure you get enough calcium and vitamin D in your diet. A lactose-free eating plan can be lacking in these important nutrients.  Take calcium and vitamin D supplements as directed by your health care provider. Talk to your provider about supplements if you are not able to get enough calcium and vitamin D from food. Which foods have lactose? Lactose is found in:  Milk and foods made from milk.  Yogurt.  Cheese.  Butter.  Margarine.  Sour cream.  Cream.  Whipped toppings and nondairy creamers.  Ice cream and other milk-based desserts. Lactose is also found in foods or products made with milk or milk ingredients. To find out whether a food contains milk or a milk ingredient, look at the ingredients list. Avoid foods with the statement "May contain milk" and foods that contain:  Butter.  Cream.  Milk.  Milk solids.  Milk powder.  Whey.  Curd.  Caseinate.  Lactose.  Lactalbumin.  Lactoglobulin. What are some alternatives to milk and foods made with milk products?  Lactose-free milk.  Soy milk with added calcium and vitamin D.  Almond, coconut, or rice milk with added calcium and vitamin D. Note that these are low in protein.  Soy products, such as soy yogurt, soy cheese, soy ice cream, and soy-based sour cream. Which  foods can I eat? Grains  Breads and rolls made without milk, such as [place], [place], or [place] bread, bagels, pita, and [employer]. Corn tortillas, corn meal, grits, and polenta. Crackers without lactose or milk solids, such as soda crackers and graham crackers. Cooked or dry cereals without lactose or milk solids. Pasta, quinoa, couscous, barley, oats, bulgur, farro, rice, wild rice, or other grains prepared without milk or lactose. Plain popcorn. Vegetables  Fresh, frozen, and canned vegetables without cheese, cream, or butter sauces. Fruits  All fresh, canned, frozen, or dried fruits that are not processed with lactose. Meats and Other Protein Sources  Plain beef, chicken, fish, [place], lamb, veal, pork, wild game, or ham. Kosher-prepared meat products. Strained or junior meats that do not contain milk. Eggs. Soy meat substitutes. Beans, lentils, and hummus. Tofu. Nuts and seeds. Peanut or other nut butters without lactose. Soups, casseroles, and mixed dishes without cheese, cream, or milk. Dairy  Lactose-free milk. Soy, rice, or almond milk with added calcium and vitamin D. Soy cheese and yogurt. Beverages  Carbonated drinks. Tea. Coffee, freeze-dried coffee, and some instant coffees. Fruit and vegetable juices. Condiments  Soy sauce. Carob powder. Olives. Gravy made with water. Baker's cocoa. Angie Fava. Pure seasonings and spices. Ketchup. Mustard. Bouillon. Broth. Sweets and Desserts  Water and fruit ices. Gelatin. Cookies, pies, or cakes made from allowed ingredients, such as angel food cake. Pudding made with water or a milk substitute. Lactose-free tofu desserts. Soy, coconut milk, or rice-milk-based frozen desserts. Sugar. Honey. Jam, jelly, and marmalade. Molasses.  Pure sugar candy. Dark chocolate without milk. Marshmallows. Fats and Oils  Margarines and salad dressings that do not contain milk. Berniece Salines. Vegetable oils. Shortening. Mayonnaise. Soy or coconut-based cream. The items listed above  may not be a complete list of recommended foods or beverages. Contact your dietitian for more options.  Which foods are not recommended? Grains  Breads and rolls that contain milk. Toaster pastries. Muffins, biscuits, waffles, cornbread, and pancakes. These can be prepared at home, commercial, or from mixes. Sweet rolls, donuts, English muffins, fry bread, lefse, flour tortillas with lactose, or [place] toast made with milk or milk ingredients. Crackers that contain lactose. Corn curls. Cooked or dry cereals with lactose. Vegetables  Creamed or breaded vegetables. Vegetables in a cheese or butter sauce or with lactose-containing margarines. Instant potatoes. [place] fries. Scalloped or au gratin potatoes. Fruits  None. Meats and Other Protein Sources  Scrambled eggs, omelets, and souffles that contain milk. Creamed or breaded meat, fish, chicken, or [place]. Sausage products, such as wieners and liver sausage. Cold cuts that contain milk solids. Cheese, cottage cheese, ricotta cheese, and cheese spreads. Lasagna and macaroni and cheese. Pizza. Peanut or other nut butters with added milk solids. Casseroles or mixed dishes containing milk or cheese. Dairy  All dairy products, including milk, goat's milk, buttermilk, kefir, acidophilus milk, flavored milk, evaporated milk, condensed milk, dulce de [place], eggnog, yogurt, cheese, and cheese spreads. Beverages  Hot chocolate. Cocoa with lactose. Instant iced teas. Powdered fruit drinks. Smoothies made with milk or yogurt. Condiments  Chewing gum that has lactose. Cocoa that has lactose. Spice blends if they contain milk products. Artificial sweeteners that contain lactose. Nondairy creamers. Sweets and Desserts  Ice cream, ice milk, gelato, sherbet, and frozen yogurt. Custard, pudding, and mousse. Cake, cream pies, cookies, and other desserts containing milk, cream, cream cheese, or milk chocolate. Pie crust made with milk-containing margarine or butter.  Reduced-calorie desserts made with a sugar substitute that contains lactose. Toffee and butterscotch. Milk, white, or dark chocolate that contains milk. Fudge. Caramel. Fats and Oils  Margarines and salad dressings that contain milk or cheese. Cream. Half and half. Cream cheese. Sour cream. Chip dips made with sour cream or yogurt. The items listed above may not be a complete list of foods and beverages to avoid. Contact your dietitian for more information.  Am I getting enough calcium? Calcium is found in many foods that contain lactose and is important for bone health. The amount of calcium you need depends on your age:  Adults younger than 50 years: 1000 mg of calcium a day.  Adults older than 50 years: 1200 mg of calcium a day. If you are not getting enough calcium, other calcium sources include:  Orange juice with calcium added. There are 300-350 mg of calcium in 1 cup of orange juice.  Sardines with edible bones. There are 325 mg of calcium in 3 oz of sardines.  Calcium-fortified soy milk. There are 300-400 mg of calcium in 1 cup of calcium-fortified soy milk.  Calcium-fortified rice or almond milk. There are 300 mg of calcium in 1 cup of calcium-fortified rice or almond milk.  Canned salmon with edible bones. There are 180 mg of calcium in 3 oz of canned salmon with edible bones.  Calcium-fortified breakfast cereals. There are [phone] mg of calcium in calcium-fortified breakfast cereals.  Tofu set with calcium sulfate. There are 250 mg of calcium in  cup of tofu set with calcium sulfate.  Spinach, cooked. There are 145  mg of calcium in  cup of cooked spinach.  Edamame, cooked. There are 130 mg of calcium in  cup of cooked edamame.  Collard greens, cooked. There are 125 mg of calcium in  cup of cooked collard greens.  Kale, frozen or cooked. There are 90 mg of calcium in  cup of cooked or frozen kale.  Almonds. There are 95 mg of calcium in  cup of almonds.  Broccoli,  cooked. There are 60 mg of calcium in 1 cup of cooked broccoli. This information is not intended to replace advice given to you by your health care provider. Make sure you discuss any questions you have with your health care provider. Document Released: 12/15/2001 Document Revised: 12/01/2015 Document Reviewed: 09/25/2013 Elsevier Interactive Patient Education  2017 [employer].

## 2016-11-19 NOTE — Progress Notes (Signed)
Subjective:    Patient ID: Susan [name], female    DOB: [birth year], 83 y.o.   MRN: [id number]  HPI  83 year old patient who is seen today for follow-up. She presents with a chief complaint of diarrhea. She states this has been intermittent for months but has worsened over the past 2 weeks. When diarrhea is an issue.  It usually occurs in the morning, often before breakfast and usually interfering with her breakfast meal. She usually has 3 loose bowel movements and then receives Imodium without any further issues throughout the day. She has a daughter with a recent diagnosis of lactose intolerance.  No nausea, vomiting, change in weight or abdominal pain  Wt Readings from Last 3 Encounters:  10/17/16 152 lb (68.9 kg)  08/30/16 150 lb (68 kg)  08/15/16 155 lb 12.8 oz (70.7 kg)   She is requesting to be considered for See early-morning administration of Imodium which has been quite helpful for an acquaintance.  She has a history of hypothyroidism and also prior history of IBS.  No new medications. She has essential hypertension which has been stable.  Past Medical History:  Diagnosis Date  . Aortic stenosis   . Atrial fibrillation, new onset ([place])   . CARCINOMA, BLADDER, TRANSITIONAL CELL 10/24/2007  . GERD (gastroesophageal reflux disease)   . Heart murmur   . HYPERTENSION 01/07/2007  . HYPOTHYROIDISM 01/07/2007  . IBS 05/03/2008  . MIXED HEARING LOSS BILATERAL 09/28/2009  . OSTEOARTHRITIS, KNEE 04/25/2007  . UNSPECIFIED ARTHROPATHY MULTIPLE SITES 10/26/2009     Social History   Social History  . Marital status: Widowed    Spouse name: N/A  . Number of children: 3  . Years of education: N/A   Occupational History  . retired Retired   Social History Main Topics  . Smoking status: Former Smoker    Packs/day: 0.50    Years: 20.00    Types: Cigarettes    Quit date: 09/05/1973  . Smokeless tobacco: Never Used  . Alcohol use Yes     Comment: ocass  . Drug use: No  .  Sexual activity: Not on file   Other Topics Concern  . Not on file   Social History Narrative  . No narrative on file    Past Surgical History:  Procedure Laterality Date  . BELPHAROPTOSIS REPAIR    . BLADDER TUMOR EXCISION    . BUNIONECTOMY     bilateral  . CATARACT EXTRACTION     bilateral  . TONSILLECTOMY AND ADENOIDECTOMY     x2    Family History  Problem Relation Age of Onset  . Stomach cancer Mother   . Arthritis Mother   . Leukemia Father   . Kidney failure Father   . Anuerysm Brother        brain  . ALS Sister   . Stomach cancer Maternal Aunt   . Colon cancer Daughter   . Esophageal cancer Neg Hx   . Rectal cancer Neg Hx     Allergies  Allergen Reactions  . Penicillins Diarrhea    Has patient had a PCN reaction causing immediate rash, facial/tongue/throat swelling, SOB or lightheadedness with hypotension: YES Has patient had a PCN reaction causing severe rash involving mucus membranes or skin necrosis:NO Has patient had a PCN reaction that required hospitalization NO Has patient had a PCN reaction occurring within the last 10 years: NO If all of the above answers are "NO", then may proceed with Cephalosporin use.  Marland Kitchen Ace  Inhibitors     REACTION: cough  . Hydrocodone     REACTION: Hallucinations---minor    Current Outpatient Prescriptions on File Prior to Visit  Medication Sig Dispense Refill  . acetaminophen (TYLENOL) 500 MG tablet Take 1,000 mg by mouth every 6 (six) hours as needed for mild pain. And 1 at bedtime     . aspirin 81 MG tablet Take 1 tablet (81 mg total) by mouth Ragle. 30 tablet   . atorvastatin (LIPITOR) 20 MG tablet TAKE 1 TABLET ONCE Hale. 90 tablet 0  . calcium carbonate (TUMS) 500 MG chewable tablet Chew 1 tablet by mouth as needed.      . carvedilol (COREG) 12.5 MG tablet TAKE  (1)  TABLET TWICE A DAY WITH MEALS (BREAKFAST AND SUPPER) 180 tablet 0  . famotidine (PEPCID) 20 MG tablet TAKE 1 TABLET TWICE Dilks. 60 tablet 0  .  glucosamine-chondroitin 500-400 MG tablet Take 1 tablet by mouth Ambroise as needed. For bones and joints    . hydrocortisone (ANUSOL-HC) 2.5 % rectal cream Place 1 application rectally 2 (two) times Demattia as needed.    Marland Kitchen levothyroxine (SYNTHROID, LEVOTHROID) 75 MCG tablet Take 0.5 tablets (37.5 mcg total) by mouth Rickman before breakfast. 30 tablet 0  . loperamide (IMODIUM A-D) 2 MG tablet Take 4 mg by mouth as needed.     . Multiple Vitamin (MULTIVITAMIN) tablet Take 2 tablets by mouth Botz.      No current facility-administered medications on file prior to visit.     BP 110/70   Pulse 65   Temp 97.6 F (36.4 C) (Oral)   Ht 5\' 1"  (1.549 m)   SpO2 98%       Review of Systems  Constitutional: Negative.   HENT: Negative for congestion, dental problem, hearing loss, rhinorrhea, sinus pressure, sore throat and tinnitus.   Eyes: Negative for pain, discharge and visual disturbance.  Respiratory: Negative for cough and shortness of breath.   Cardiovascular: Negative for chest pain, palpitations and leg swelling.  Gastrointestinal: Positive for diarrhea. Negative for abdominal distention, abdominal pain, blood in stool, constipation, nausea and vomiting.  Genitourinary: Negative for difficulty urinating, dysuria, flank pain, frequency, hematuria, pelvic pain, urgency, vaginal bleeding, vaginal discharge and vaginal pain.  Musculoskeletal: Negative for arthralgias, gait problem and joint swelling.  Skin: Negative for rash.  Neurological: Negative for dizziness, syncope, speech difficulty, weakness, numbness and headaches.  Hematological: Negative for adenopathy.  Psychiatric/Behavioral: Negative for agitation, behavioral problems and dysphoric mood. The patient is not nervous/anxious.        Objective:   Physical Exam  Constitutional: She is oriented to person, place, and time. She appears well-developed and well-nourished. No distress.   Alert, no distress Blood pressure low  normal Pulse rate 65 Wheelchair bound  HENT:  Head: Normocephalic.  Right Ear: External ear normal.  Left Ear: External ear normal.  Mouth/Throat: Oropharynx is clear and moist.  Eyes: Conjunctivae and EOM are normal. Pupils are equal, round, and reactive to light.  Neck: Normal range of motion. Neck supple. No thyromegaly present.  Cardiovascular: Normal rate, regular rhythm and intact distal pulses.   Murmur heard. Grade 3/6 harsh systolic murmur  Pulmonary/Chest: Effort normal and breath sounds normal.  Abdominal: Soft. Bowel sounds are normal. She exhibits no mass. There is no tenderness.  Musculoskeletal: Normal range of motion.  Lymphadenopathy:    She has no cervical adenopathy.  Neurological: She is alert and oriented to person, place, and time.  Skin: Skin is  warm and dry. No rash noted.  Psychiatric: She has a normal mood and affect. Her behavior is normal.          Assessment & Plan:   Intermittent diarrhea Family history of lactose intolerance History of IBS Essential hypertension, stable History of atrial fibrillation History of aortic stenosis   Will give a trial of a lactose-free diet and observe No change in medical regimen We'll continue when necessary Imodium, but will hold off on a Goris regimen  Nyoka Cowden

## 2016-11-20 DIAGNOSIS — R296 Repeated falls: Secondary | ICD-10-CM | POA: Diagnosis not present

## 2016-11-20 DIAGNOSIS — R278 Other lack of coordination: Secondary | ICD-10-CM | POA: Diagnosis not present

## 2016-11-20 DIAGNOSIS — I480 Paroxysmal atrial fibrillation: Secondary | ICD-10-CM | POA: Diagnosis not present

## 2016-11-20 DIAGNOSIS — R42 Dizziness and giddiness: Secondary | ICD-10-CM | POA: Diagnosis not present

## 2016-11-21 DIAGNOSIS — R42 Dizziness and giddiness: Secondary | ICD-10-CM | POA: Diagnosis not present

## 2016-11-21 DIAGNOSIS — I480 Paroxysmal atrial fibrillation: Secondary | ICD-10-CM | POA: Diagnosis not present

## 2016-11-21 DIAGNOSIS — R296 Repeated falls: Secondary | ICD-10-CM | POA: Diagnosis not present

## 2016-11-21 DIAGNOSIS — R278 Other lack of coordination: Secondary | ICD-10-CM | POA: Diagnosis not present

## 2016-11-22 DIAGNOSIS — I480 Paroxysmal atrial fibrillation: Secondary | ICD-10-CM | POA: Diagnosis not present

## 2016-11-22 DIAGNOSIS — R278 Other lack of coordination: Secondary | ICD-10-CM | POA: Diagnosis not present

## 2016-11-22 DIAGNOSIS — R42 Dizziness and giddiness: Secondary | ICD-10-CM | POA: Diagnosis not present

## 2016-11-22 DIAGNOSIS — R296 Repeated falls: Secondary | ICD-10-CM | POA: Diagnosis not present

## 2016-11-27 DIAGNOSIS — R278 Other lack of coordination: Secondary | ICD-10-CM | POA: Diagnosis not present

## 2016-11-27 DIAGNOSIS — R42 Dizziness and giddiness: Secondary | ICD-10-CM | POA: Diagnosis not present

## 2016-11-27 DIAGNOSIS — R296 Repeated falls: Secondary | ICD-10-CM | POA: Diagnosis not present

## 2016-11-27 DIAGNOSIS — I480 Paroxysmal atrial fibrillation: Secondary | ICD-10-CM | POA: Diagnosis not present

## 2016-11-29 DIAGNOSIS — I480 Paroxysmal atrial fibrillation: Secondary | ICD-10-CM | POA: Diagnosis not present

## 2016-11-29 DIAGNOSIS — R42 Dizziness and giddiness: Secondary | ICD-10-CM | POA: Diagnosis not present

## 2016-11-29 DIAGNOSIS — R278 Other lack of coordination: Secondary | ICD-10-CM | POA: Diagnosis not present

## 2016-11-29 DIAGNOSIS — R296 Repeated falls: Secondary | ICD-10-CM | POA: Diagnosis not present

## 2016-12-02 DIAGNOSIS — R42 Dizziness and giddiness: Secondary | ICD-10-CM | POA: Diagnosis not present

## 2016-12-02 DIAGNOSIS — R296 Repeated falls: Secondary | ICD-10-CM | POA: Diagnosis not present

## 2016-12-02 DIAGNOSIS — R278 Other lack of coordination: Secondary | ICD-10-CM | POA: Diagnosis not present

## 2016-12-02 DIAGNOSIS — I480 Paroxysmal atrial fibrillation: Secondary | ICD-10-CM | POA: Diagnosis not present

## 2016-12-04 DIAGNOSIS — R278 Other lack of coordination: Secondary | ICD-10-CM | POA: Diagnosis not present

## 2016-12-04 DIAGNOSIS — R42 Dizziness and giddiness: Secondary | ICD-10-CM | POA: Diagnosis not present

## 2016-12-04 DIAGNOSIS — R296 Repeated falls: Secondary | ICD-10-CM | POA: Diagnosis not present

## 2016-12-04 DIAGNOSIS — I480 Paroxysmal atrial fibrillation: Secondary | ICD-10-CM | POA: Diagnosis not present

## 2016-12-06 DIAGNOSIS — R278 Other lack of coordination: Secondary | ICD-10-CM | POA: Diagnosis not present

## 2016-12-06 DIAGNOSIS — R296 Repeated falls: Secondary | ICD-10-CM | POA: Diagnosis not present

## 2016-12-06 DIAGNOSIS — R42 Dizziness and giddiness: Secondary | ICD-10-CM | POA: Diagnosis not present

## 2016-12-06 DIAGNOSIS — I480 Paroxysmal atrial fibrillation: Secondary | ICD-10-CM | POA: Diagnosis not present

## 2016-12-12 DIAGNOSIS — R42 Dizziness and giddiness: Secondary | ICD-10-CM | POA: Diagnosis not present

## 2016-12-12 DIAGNOSIS — R296 Repeated falls: Secondary | ICD-10-CM | POA: Diagnosis not present

## 2016-12-12 DIAGNOSIS — I480 Paroxysmal atrial fibrillation: Secondary | ICD-10-CM | POA: Diagnosis not present

## 2016-12-12 DIAGNOSIS — R278 Other lack of coordination: Secondary | ICD-10-CM | POA: Diagnosis not present

## 2017-02-07 DIAGNOSIS — H353131 Nonexudative age-related macular degeneration, bilateral, early dry stage: Secondary | ICD-10-CM | POA: Diagnosis not present

## 2017-02-07 DIAGNOSIS — Z961 Presence of intraocular lens: Secondary | ICD-10-CM | POA: Diagnosis not present

## 2017-02-07 DIAGNOSIS — H04123 Dry eye syndrome of bilateral lacrimal glands: Secondary | ICD-10-CM | POA: Diagnosis not present

## 2017-02-12 ENCOUNTER — Ambulatory Visit (INDEPENDENT_AMBULATORY_CARE_PROVIDER_SITE_OTHER): Payer: Medicare Other | Admitting: Family Medicine

## 2017-02-12 ENCOUNTER — Encounter: Payer: Self-pay | Admitting: Family Medicine

## 2017-02-12 VITALS — BP 130/80 | HR 91 | Temp 98.2°F

## 2017-02-12 DIAGNOSIS — K1120 Sialoadenitis, unspecified: Secondary | ICD-10-CM | POA: Diagnosis not present

## 2017-02-12 DIAGNOSIS — R229 Localized swelling, mass and lump, unspecified: Secondary | ICD-10-CM | POA: Diagnosis not present

## 2017-02-12 NOTE — Patient Instructions (Signed)
Stay well hydrated Follow up immediately for any increased redness, pain, and heat- this could suggest secondary infection You likely have basal cell skin cancer right side of face and set up follow up with dermatologist.

## 2017-02-12 NOTE — Progress Notes (Signed)
Subjective:     Patient ID: Susan [name], female   DOB: 1918/10/18, 83 y.o.   MRN: [id number]  HPI Patient is a pleasant 83 year old female who is seen with onset around lunch today of some swelling of her right parotid gland region. After about one hour this went down. She did not notice any redness or significant pain. She has had some issues with similar problem in the past. Denies any similar swelling of the left side. She is hard of hearing and has hearing aids in place but still has some difficulties with hearing at times.  No pain with chewing.   No TMJ pain.  No intraoral pain.  Past Medical History:  Diagnosis Date  . Aortic stenosis   . Atrial fibrillation, new onset ([place])   . CARCINOMA, BLADDER, TRANSITIONAL CELL 10/24/2007  . GERD (gastroesophageal reflux disease)   . Heart murmur   . HYPERTENSION 01/07/2007  . HYPOTHYROIDISM 01/07/2007  . IBS 05/03/2008  . MIXED HEARING LOSS BILATERAL 09/28/2009  . OSTEOARTHRITIS, KNEE 04/25/2007  . UNSPECIFIED ARTHROPATHY MULTIPLE SITES 10/26/2009   Past Surgical History:  Procedure Laterality Date  . BELPHAROPTOSIS REPAIR    . BLADDER TUMOR EXCISION    . BUNIONECTOMY     bilateral  . CATARACT EXTRACTION     bilateral  . TONSILLECTOMY AND ADENOIDECTOMY     x2    reports that she quit smoking about 43 years ago. Her smoking use included Cigarettes. She has a 10.00 pack-year smoking history. She has never used smokeless tobacco. She reports that she drinks alcohol. She reports that she does not use drugs. family history includes ALS in her sister; Anuerysm in her brother; Arthritis in her mother; Colon cancer in her daughter; Kidney failure in her father; Leukemia in her father; Stomach cancer in her maternal aunt and mother. Allergies  Allergen Reactions  . Penicillins Diarrhea    Has patient had a PCN reaction causing immediate rash, facial/tongue/throat swelling, SOB or lightheadedness with hypotension: YES Has patient had a PCN  reaction causing severe rash involving mucus membranes or skin necrosis:NO Has patient had a PCN reaction that required hospitalization NO Has patient had a PCN reaction occurring within the last 10 years: NO If all of the above answers are "NO", then may proceed with Cephalosporin use.  . Ace Inhibitors     REACTION: cough  . Hydrocodone     REACTION: Hallucinations---minor     Review of Systems  Constitutional: Negative for chills and fever.  HENT: Positive for facial swelling. Negative for ear pain and sinus pressure.   Skin: Negative for rash.       Objective:   Physical Exam  Constitutional: She appears well-developed and well-nourished.  HENT:  She has moderate cerumen right canal and minimal in the left canal. No obvious facial asymmetry this time. She does not have any warmth or tenderness or erythema of the right parotid region. No parotid masses.  Oropharynx clear. No visible swelling around Stensen's duct  Neck:  Supple with no adenopathy noted  Skin:  Patient has somewhat irregular fleshy nodular lesion right side of face anterior to the ear       Assessment:     #1 Acute sialadenitis right parotid gland. No signs of secondary infection at this time.  She does not have any significant edema at this time  #2 Probable basal cell carcinoma right side of face    Plan:     -Encouraged to stay well-hydrated -Explained these (  acute sialadenitis) are frequently self resolving -Follow-up promptly for any redness, pain, or warmth to suggest any infection -She is encouraged to set up follow-up with her dermatologist regarding right facial lesion  Eulas Post MD [facility]

## 2017-02-13 ENCOUNTER — Telehealth: Payer: Self-pay

## 2017-02-13 NOTE — Telephone Encounter (Signed)
Spoke with [employer] and advised. She will call Friday if pt has not improved. Nothing further needed at this time.

## 2017-02-13 NOTE — Telephone Encounter (Signed)
Beth called from [facility] to report that pt has had increase in swelling and pain over Left parotid area. Pt was seen for this yesterday by Dr. Elease Hashimoto. Pt c/o pain, increased with chewing, and warmth over area of swelling. Nurse denies any fever or other s/s of infection. Pt does have + hx of salivary stones. They would like to know what you recommend  Dr. Raliegh Ip - Please advise. Thanks!

## 2017-02-13 NOTE — Telephone Encounter (Signed)
Warm compresses to the affected area 3 times Egner Drink as much fluid as you  can tolerate over the next few days

## 2017-02-26 ENCOUNTER — Encounter: Payer: Self-pay | Admitting: Internal Medicine

## 2017-02-26 ENCOUNTER — Ambulatory Visit (INDEPENDENT_AMBULATORY_CARE_PROVIDER_SITE_OTHER): Payer: Medicare Other | Admitting: Internal Medicine

## 2017-02-26 VITALS — BP 130/82 | HR 66 | Temp 98.3°F | Ht 61.0 in | Wt 154.2 lb

## 2017-02-26 DIAGNOSIS — I350 Nonrheumatic aortic (valve) stenosis: Secondary | ICD-10-CM | POA: Diagnosis not present

## 2017-02-26 DIAGNOSIS — I10 Essential (primary) hypertension: Secondary | ICD-10-CM | POA: Diagnosis not present

## 2017-02-26 MED ORDER — LOPERAMIDE HCL 2 MG PO CHEW: CHEWABLE_TABLET | ORAL | 3 refills | Status: AC

## 2017-02-26 NOTE — Patient Instructions (Addendum)
Dermatology evaluation as scheduled  Return as scheduled for general follow-up  Cardiology follow-up as scheduled

## 2017-02-26 NOTE — Progress Notes (Signed)
Subjective:    Patient ID: Susan [name], female    DOB: [birth year], 83 y.o.   MRN: [id number]  HPI  83 year old patient who presents today with a 2 day history of loose stools.  She does have a when necessary order for Imodium AD.  Denies any nausea or vomiting or fever.  Generally she feels well except for some mild weakness. She has a history of essential hypertension as well as aortic stenosis  Past Medical History:  Diagnosis Date  . Aortic stenosis   . Atrial fibrillation, new onset ([place])   . CARCINOMA, BLADDER, TRANSITIONAL CELL 10/24/2007  . GERD (gastroesophageal reflux disease)   . Heart murmur   . HYPERTENSION 01/07/2007  . HYPOTHYROIDISM 01/07/2007  . IBS 05/03/2008  . MIXED HEARING LOSS BILATERAL 09/28/2009  . OSTEOARTHRITIS, KNEE 04/25/2007  . UNSPECIFIED ARTHROPATHY MULTIPLE SITES 10/26/2009     Social History   Social History  . Marital status: Widowed    Spouse name: N/A  . Number of children: 3  . Years of education: N/A   Occupational History  . retired Retired   Social History Main Topics  . Smoking status: Former Smoker    Packs/day: 0.50    Years: 20.00    Types: Cigarettes    Quit date: 09/05/1973  . Smokeless tobacco: Never Used  . Alcohol use Yes     Comment: ocass  . Drug use: No  . Sexual activity: Not on file   Other Topics Concern  . Not on file   Social History Narrative  . No narrative on file    Past Surgical History:  Procedure Laterality Date  . BELPHAROPTOSIS REPAIR    . BLADDER TUMOR EXCISION    . BUNIONECTOMY     bilateral  . CATARACT EXTRACTION     bilateral  . TONSILLECTOMY AND ADENOIDECTOMY     x2    Family History  Problem Relation Age of Onset  . Stomach cancer Mother   . Arthritis Mother   . Leukemia Father   . Kidney failure Father   . Anuerysm Brother        brain  . ALS Sister   . Stomach cancer Maternal Aunt   . Colon cancer Daughter   . Esophageal cancer Neg Hx   . Rectal cancer Neg Hx      Allergies  Allergen Reactions  . Penicillins Diarrhea    Has patient had a PCN reaction causing immediate rash, facial/tongue/throat swelling, SOB or lightheadedness with hypotension: YES Has patient had a PCN reaction causing severe rash involving mucus membranes or skin necrosis:NO Has patient had a PCN reaction that required hospitalization NO Has patient had a PCN reaction occurring within the last 10 years: NO If all of the above answers are "NO", then may proceed with Cephalosporin use.  . Ace Inhibitors     REACTION: cough  . Hydrocodone     REACTION: Hallucinations---minor    Current Outpatient Prescriptions on File Prior to Visit  Medication Sig Dispense Refill  . acetaminophen (TYLENOL) 500 MG tablet Take 1,000 mg by mouth every 6 (six) hours as needed for mild pain. And 1 at bedtime     . aspirin 81 MG tablet Take 1 tablet (81 mg total) by mouth Cadavid. 30 tablet   . atorvastatin (LIPITOR) 20 MG tablet TAKE 1 TABLET ONCE Deeds. 90 tablet 0  . calcium carbonate (TUMS) 500 MG chewable tablet Chew 1 tablet by mouth as needed.      Marland Kitchen  carvedilol (COREG) 12.5 MG tablet TAKE  (1)  TABLET TWICE A DAY WITH MEALS (BREAKFAST AND SUPPER) 180 tablet 0  . famotidine (PEPCID) 20 MG tablet TAKE 1 TABLET TWICE Wieneke. 60 tablet 0  . glucosamine-chondroitin 500-400 MG tablet Take 1 tablet by mouth Carbon as needed. For bones and joints    . hydrocortisone (ANUSOL-HC) 2.5 % rectal cream Place 1 application rectally 2 (two) times Albertsen as needed.    Marland Kitchen levothyroxine (SYNTHROID, LEVOTHROID) 75 MCG tablet Take 0.5 tablets (37.5 mcg total) by mouth [place] before breakfast. 30 tablet 0  . Multiple Vitamin (MULTIVITAMIN) tablet Take 2 tablets by mouth Schumm.      No current facility-administered medications on file prior to visit.     BP 130/82 (BP Location: Left Arm, Patient Position: Sitting, Cuff Size: Normal)   Pulse 66   Temp 98.3 F (36.8 C) (Oral)   Ht 5\' 1"  (1.549 m)   Wt 154 lb 3.2 oz  (69.9 kg)   SpO2 96%   BMI 29.14 kg/m     Review of Systems  Constitutional: Positive for fatigue. Negative for fever.  HENT: Negative for congestion, dental problem, hearing loss, rhinorrhea, sinus pressure, sore throat and tinnitus.   Eyes: Negative for pain, discharge and visual disturbance.  Respiratory: Negative for cough and shortness of breath.   Cardiovascular: Negative for chest pain, palpitations and leg swelling.  Gastrointestinal: Positive for diarrhea. Negative for abdominal distention, abdominal pain, blood in stool, constipation, nausea and vomiting.  Genitourinary: Negative for difficulty urinating, dysuria, flank pain, frequency, hematuria, pelvic pain, urgency, vaginal bleeding, vaginal discharge and vaginal pain.  Musculoskeletal: Negative for arthralgias, gait problem and joint swelling.  Skin: Negative for rash.  Neurological: Negative for dizziness, syncope, speech difficulty, weakness, numbness and headaches.  Hematological: Negative for adenopathy.  Psychiatric/Behavioral: Negative for agitation, behavioral problems and dysphoric mood. The patient is not nervous/anxious.        Objective:   Physical Exam  Constitutional: She is oriented to person, place, and time. She appears well-developed and well-nourished.  HENT:  Head: Normocephalic.  Right Ear: External ear normal.  Left Ear: External ear normal.  Mouth/Throat: Oropharynx is clear and moist.  Eyes: Pupils are equal, round, and reactive to light. Conjunctivae and EOM are normal.  Neck: Normal range of motion. Neck supple. No thyromegaly present.  Cardiovascular: Normal rate, regular rhythm and intact distal pulses.   Murmur heard. Grade 3/6 harsh systolic murmur at the base that radiates to the carotid distribution  Pulmonary/Chest: Effort normal and breath sounds normal.  Abdominal: Soft. Bowel sounds are normal. She exhibits no mass. There is no tenderness. There is no rebound and no guarding.   Musculoskeletal: Normal range of motion.  Lymphadenopathy:    She has no cervical adenopathy.  Neurological: She is alert and oriented to person, place, and time.  Skin: Skin is warm and dry. No rash noted.  Psychiatric: She has a normal mood and affect. Her behavior is normal.          Assessment & Plan:   Nonspecific diarrhea of 2 days' duration.  Will continue Imodium AD Aortic stenosis Essential hypertension  No change in medical regimen Return office visit 3 months  Birttany Dechellis Pilar Plate

## 2017-02-27 DIAGNOSIS — Z85828 Personal history of other malignant neoplasm of skin: Secondary | ICD-10-CM | POA: Diagnosis not present

## 2017-02-27 DIAGNOSIS — L821 Other seborrheic keratosis: Secondary | ICD-10-CM | POA: Diagnosis not present

## 2017-02-27 DIAGNOSIS — C44319 Basal cell carcinoma of skin of other parts of face: Secondary | ICD-10-CM | POA: Diagnosis not present

## 2017-02-27 DIAGNOSIS — L570 Actinic keratosis: Secondary | ICD-10-CM | POA: Diagnosis not present

## 2017-02-27 DIAGNOSIS — L720 Epidermal cyst: Secondary | ICD-10-CM | POA: Diagnosis not present

## 2017-02-27 DIAGNOSIS — D485 Neoplasm of uncertain behavior of skin: Secondary | ICD-10-CM | POA: Diagnosis not present

## 2017-03-18 NOTE — Progress Notes (Signed)
HPI The patient presents for follow up of AS and PAF.  She returns for follow up and has been doing well.  Her dizziness , which was a significant complaints, is better with increased hydration and with caution and other maneuvers to help her avoid orthostasis. We are allowing her to have a little permissive hypertension. She's had no presyncope or syncope. She's not had any chest pressure, neck or arm discomfort. She doesn't think she's been there approximately atrial fibrillation but she's not really known of this in the past. Regardless she doesn't feel palpitations. She doesn't have any shortness of breath, PND or orthopnea. She gets around very long distances like the hallways in a wheelchair. She ambulates with a walker in her room. She is in assisted living.   Allergies  Allergen Reactions  . Penicillins Diarrhea    Has patient had a PCN reaction causing immediate rash, facial/tongue/throat swelling, SOB or lightheadedness with hypotension: YES Has patient had a PCN reaction causing severe rash involving mucus membranes or skin necrosis:NO Has patient had a PCN reaction that required hospitalization NO Has patient had a PCN reaction occurring within the last 10 years: NO If all of the above answers are "NO", then may proceed with Cephalosporin use.  . Ace Inhibitors     REACTION: cough  . Hydrocodone     REACTION: Hallucinations---minor    Current Outpatient Prescriptions  Medication Sig Dispense Refill  . acetaminophen (TYLENOL) 500 MG tablet Take 1,000 mg by mouth every 6 (six) hours as needed for mild pain. And 1 at bedtime     . aspirin 81 MG tablet Take 1 tablet (81 mg total) by mouth Deasis. 30 tablet   . atorvastatin (LIPITOR) 20 MG tablet TAKE 1 TABLET ONCE Bunyan. 90 tablet 0  . calcium carbonate (TUMS) 500 MG chewable tablet Chew 1 tablet by mouth as needed.      . carvedilol (COREG) 12.5 MG tablet TAKE  (1)  TABLET TWICE A DAY WITH MEALS (BREAKFAST AND SUPPER) 180 tablet  0  . famotidine (PEPCID) 20 MG tablet TAKE 1 TABLET TWICE Hardacre. 60 tablet 0  . glucosamine-chondroitin 500-400 MG tablet Take 1 tablet by mouth Sewell as needed. For bones and joints    . hydrocortisone (ANUSOL-HC) 2.5 % rectal cream Place 1 application rectally 2 (two) times Fossum as needed.    Marland Kitchen levothyroxine (SYNTHROID, LEVOTHROID) 75 MCG tablet Take 0.5 tablets (37.5 mcg total) by mouth Damas before breakfast. 30 tablet 0  . Loperamide HCl 2 MG CHEW 1 tablet every 6 hours as needed for diarrhea.  May take as a preventive measure prior to visits out of the facility 30 tablet 3  . Multiple Vitamin (MULTIVITAMIN) tablet Take 2 tablets by mouth Dyment.      No current facility-administered medications for this visit.     Past Medical History:  Diagnosis Date  . Aortic stenosis   . Atrial fibrillation, new onset ([place])   . CARCINOMA, BLADDER, TRANSITIONAL CELL 10/24/2007  . GERD (gastroesophageal reflux disease)   . HYPERTENSION 01/07/2007  . HYPOTHYROIDISM 01/07/2007  . IBS 05/03/2008  . MIXED HEARING LOSS BILATERAL 09/28/2009  . OSTEOARTHRITIS, KNEE 04/25/2007  . UNSPECIFIED ARTHROPATHY MULTIPLE SITES 10/26/2009    Past Surgical History:  Procedure Laterality Date  . BELPHAROPTOSIS REPAIR    . BLADDER TUMOR EXCISION    . BUNIONECTOMY     bilateral  . CATARACT EXTRACTION     bilateral  . TONSILLECTOMY AND ADENOIDECTOMY  x2    ROS:   As stated in the HPI and negative for all other systems.   PHYSICAL EXAM BP (!) 148/76   Pulse 69   Ht 5\' 1"  (1.549 m)   GENERAL:  Slightly frail appearing but in no distress  NECK:  No jugular venous distention, waveform within normal limits, carotid upstroke brisk and symmetric, no bruits, no thyromegaly LUNGS:  Clear to auscultation bilaterally CHEST:  Unremarkable HEART:  PMI not displaced or sustained,S1 and S2 within normal limits, no S3, no S4, no clicks, no rubs, 3 out of 6 apical systolic murmur radiating out the aortic outflow tract and  up into the carotids, no diastolic murmurs ABD:  Flat, positive bowel sounds normal in frequency in pitch, no bruits, no rebound, no guarding, no midline pulsatile mass, no hepatomegaly, no splenomegaly EXT:  2 plus pulses throughout, trace edema, no cyanosis no clubbing   EKG:  NA.     ASSESSMENT AND PLAN   Aortic stenosis - This was moderate on echo last year.  I doubt that it is worse.  No change in therapy is indicated.  No imaging is indicated at this point.  We will follow this clinically.   HTN - The blood pressure is at target. No change in medications is indicated. We will continue with therapeutic lifestyle changes (TLC).  PAF - Susan [name] has a CHA2DS2 - VASc score of 4 with a risk of stroke of 4%.  However, I have not seen any recent documentation of this. She still is at significant fall risk. We reviewed this at length and I reviewed her previous monitor and there was no atrial fibrillation. Therefore, we continued to avoid anticoagulation.

## 2017-03-19 ENCOUNTER — Encounter: Payer: Self-pay | Admitting: Cardiology

## 2017-03-19 ENCOUNTER — Ambulatory Visit (INDEPENDENT_AMBULATORY_CARE_PROVIDER_SITE_OTHER): Payer: Medicare Other | Admitting: Cardiology

## 2017-03-19 VITALS — BP 148/76 | HR 69 | Ht 61.0 in

## 2017-03-19 DIAGNOSIS — I480 Paroxysmal atrial fibrillation: Secondary | ICD-10-CM | POA: Diagnosis not present

## 2017-03-19 DIAGNOSIS — I350 Nonrheumatic aortic (valve) stenosis: Secondary | ICD-10-CM | POA: Diagnosis not present

## 2017-03-19 NOTE — Patient Instructions (Signed)

## 2017-03-20 ENCOUNTER — Encounter: Payer: Self-pay | Admitting: Cardiology

## 2017-03-28 ENCOUNTER — Encounter: Payer: Self-pay | Admitting: Internal Medicine

## 2017-04-11 ENCOUNTER — Encounter: Payer: Self-pay | Admitting: Internal Medicine

## 2017-04-11 ENCOUNTER — Ambulatory Visit (INDEPENDENT_AMBULATORY_CARE_PROVIDER_SITE_OTHER): Payer: Medicare Other | Admitting: Internal Medicine

## 2017-04-11 VITALS — BP 128/78 | HR 80 | Temp 97.8°F | Wt 155.0 lb

## 2017-04-11 DIAGNOSIS — I350 Nonrheumatic aortic (valve) stenosis: Secondary | ICD-10-CM | POA: Diagnosis not present

## 2017-04-11 DIAGNOSIS — I480 Paroxysmal atrial fibrillation: Secondary | ICD-10-CM | POA: Diagnosis not present

## 2017-04-11 DIAGNOSIS — E039 Hypothyroidism, unspecified: Secondary | ICD-10-CM | POA: Diagnosis not present

## 2017-04-11 DIAGNOSIS — I10 Essential (primary) hypertension: Secondary | ICD-10-CM | POA: Diagnosis not present

## 2017-04-11 MED ORDER — ACETAMINOPHEN 325 MG PO TABS: 650.0000 mg | ORAL_TABLET | Freq: Three times a day (TID) | ORAL | Status: AC

## 2017-04-11 MED ORDER — GLUCOSAMINE-CHONDROITIN 500-400 MG PO TABS: 1.0000 | ORAL_TABLET | Freq: Three times a day (TID) | ORAL | Status: DC

## 2017-04-11 NOTE — Progress Notes (Signed)
Subjective:    Patient ID: Susan [name], female    DOB: [birth year], 83 y.o.   MRN: [id number]  HPI  83 year old patient who is seen today in follow-up.  She has a history of essential hypertension and paroxysmal atrial fibrillation.  She has moderate aortic stenosis.  She continues to do quite well.  She does use a powered wheelchair for mobility.  She has a history of hypothyroidism Chief complaints today are arthritis pain mainly in the small joints of the right hand and hip area.  Past Medical History:  Diagnosis Date  . Aortic stenosis   . Atrial fibrillation, new onset ([place])   . CARCINOMA, BLADDER, TRANSITIONAL CELL 10/24/2007  . GERD (gastroesophageal reflux disease)   . HYPERTENSION 01/07/2007  . HYPOTHYROIDISM 01/07/2007  . IBS 05/03/2008  . MIXED HEARING LOSS BILATERAL 09/28/2009  . OSTEOARTHRITIS, KNEE 04/25/2007  . UNSPECIFIED ARTHROPATHY MULTIPLE SITES 10/26/2009     Social History   Social History  . Marital status: Widowed    Spouse name: N/A  . Number of children: 3  . Years of education: N/A   Occupational History  . retired Retired   Social History Main Topics  . Smoking status: Former Smoker    Packs/day: 0.50    Years: 20.00    Types: Cigarettes    Quit date: 09/05/1973  . Smokeless tobacco: Never Used  . Alcohol use Yes     Comment: ocass  . Drug use: No  . Sexual activity: Not on file   Other Topics Concern  . Not on file   Social History Narrative  . No narrative on file    Past Surgical History:  Procedure Laterality Date  . BELPHAROPTOSIS REPAIR    . BLADDER TUMOR EXCISION    . BUNIONECTOMY     bilateral  . CATARACT EXTRACTION     bilateral  . TONSILLECTOMY AND ADENOIDECTOMY     x2    Family History  Problem Relation Age of Onset  . Stomach cancer Mother   . Arthritis Mother   . Leukemia Father   . Kidney failure Father   . Anuerysm Brother        brain  . ALS Sister   . Stomach cancer Maternal Aunt   . Colon cancer  Daughter   . Esophageal cancer Neg Hx   . Rectal cancer Neg Hx     Allergies  Allergen Reactions  . Penicillins Diarrhea    Has patient had a PCN reaction causing immediate rash, facial/tongue/throat swelling, SOB or lightheadedness with hypotension: YES Has patient had a PCN reaction causing severe rash involving mucus membranes or skin necrosis:NO Has patient had a PCN reaction that required hospitalization NO Has patient had a PCN reaction occurring within the last 10 years: NO If all of the above answers are "NO", then may proceed with Cephalosporin use.  . Ace Inhibitors     REACTION: cough  . Hydrocodone     REACTION: Hallucinations---minor    Current Outpatient Prescriptions on File Prior to Visit  Medication Sig Dispense Refill  . aspirin 81 MG tablet Take 1 tablet (81 mg total) by mouth [place]. 30 tablet   . atorvastatin (LIPITOR) 20 MG tablet TAKE 1 TABLET ONCE Coye. 90 tablet 0  . calcium carbonate (TUMS) 500 MG chewable tablet Chew 1 tablet by mouth as needed.      . carvedilol (COREG) 12.5 MG tablet TAKE  (1)  TABLET TWICE A DAY WITH MEALS (BREAKFAST AND SUPPER)  180 tablet 0  . famotidine (PEPCID) 20 MG tablet TAKE 1 TABLET TWICE Pattison. 60 tablet 0  . hydrocortisone (ANUSOL-HC) 2.5 % rectal cream Place 1 application rectally 2 (two) times Kozinski as needed.    Marland Kitchen levothyroxine (SYNTHROID, LEVOTHROID) 75 MCG tablet Take 0.5 tablets (37.5 mcg total) by mouth [place] before breakfast. 30 tablet 0  . Loperamide HCl 2 MG CHEW 1 tablet every 6 hours as needed for diarrhea.  May take as a preventive measure prior to visits out of the facility 30 tablet 3  . Multiple Vitamin (MULTIVITAMIN) tablet Take 2 tablets by mouth Carver.      No current facility-administered medications on file prior to visit.     BP 128/78 (BP Location: Right Arm, Patient Position: Sitting, Cuff Size: Normal)   Pulse 80   Temp 97.8 F (36.6 C) (Oral)   Wt 155 lb (70.3 kg)   SpO2 98%   BMI 29.29 kg/m       Review of Systems  Constitutional: Positive for fatigue.  HENT: Negative for congestion, dental problem, hearing loss, rhinorrhea, sinus pressure, sore throat and tinnitus.   Eyes: Negative for pain, discharge and visual disturbance.  Respiratory: Negative for cough and shortness of breath.   Cardiovascular: Negative for chest pain, palpitations and leg swelling.  Gastrointestinal: Negative for abdominal distention, abdominal pain, blood in stool, constipation, diarrhea, nausea and vomiting.  Genitourinary: Negative for difficulty urinating, dysuria, flank pain, frequency, hematuria, pelvic pain, urgency, vaginal bleeding, vaginal discharge and vaginal pain.  Musculoskeletal: Positive for arthralgias, back pain and gait problem. Negative for joint swelling.  Skin: Negative for rash.  Neurological: Negative for dizziness, syncope, speech difficulty, weakness, numbness and headaches.  Hematological: Negative for adenopathy.  Psychiatric/Behavioral: Negative for agitation, behavioral problems and dysphoric mood. The patient is not nervous/anxious.        Objective:   Physical Exam  Constitutional: She is oriented to person, place, and time. She appears well-developed and well-nourished.  HENT:  Head: Normocephalic.  Right Ear: External ear normal.  Left Ear: External ear normal.  Mouth/Throat: Oropharynx is clear and moist.  Eyes: Pupils are equal, round, and reactive to light. Conjunctivae and EOM are normal.  Neck: Normal range of motion. Neck supple. No thyromegaly present.  Cardiovascular: Normal rate, regular rhythm, normal heart sounds and intact distal pulses.   Pulmonary/Chest: Effort normal and breath sounds normal.  Abdominal: Soft. Bowel sounds are normal. She exhibits no mass. There is no tenderness.  Musculoskeletal: Normal range of motion.  Advanced osteoarthritic joint changes of the small joints of the hands, much more prominent on the right  Lymphadenopathy:     She has no cervical adenopathy.  Neurological: She is alert and oriented to person, place, and time.  Skin: Skin is warm and dry. No rash noted.  Psychiatric: She has a normal mood and affect. Her behavior is normal.          Assessment & Plan:   Essential hypertension, well-controlled Hypothyroidism. Osteoarthritis.  The patient states that she has benefited from chondroitin sulfate and glucosamine.  Will increase to a 3 times a day regimen  The patient is on acetaminophen when necessary we'll also change to a 3 times a day regimen Dyslipidemia.  Continue statin therapy  Follow-up screening lab Flu vaccine to be administered at her facility later this month Follow-up 6 months  Leory Allinson Pilar Plate

## 2017-04-11 NOTE — Patient Instructions (Signed)
Acetaminophen 650 mg 3 times Lepak  Increase glucosamine/chondroitin 1 tablet 3 times Mcloud  Flu vaccine this month  Follow-up in 6 months or as needed

## 2017-04-12 LAB — CBC WITH DIFFERENTIAL/PLATELET
BASOS ABS: 0 10*3/uL (ref 0.0–0.1)
Basophils Relative: 0.6 % (ref 0.0–3.0)
EOS ABS: 0.1 10*3/uL (ref 0.0–0.7)
Eosinophils Relative: 1.7 % (ref 0.0–5.0)
HEMATOCRIT: 36.6 % (ref 36.0–46.0)
Hemoglobin: 11.8 g/dL — ABNORMAL LOW (ref 12.0–15.0)
LYMPHS ABS: 2.6 10*3/uL (ref 0.7–4.0)
LYMPHS PCT: 33.8 % (ref 12.0–46.0)
MCHC: 32.2 g/dL (ref 30.0–36.0)
MCV: 90.6 fl (ref 78.0–100.0)
MONO ABS: 0.7 10*3/uL (ref 0.1–1.0)
Monocytes Relative: 9.1 % (ref 3.0–12.0)
NEUTROS PCT: 54.8 % (ref 43.0–77.0)
Neutro Abs: 4.3 10*3/uL (ref 1.4–7.7)
Platelets: 201 10*3/uL (ref 150.0–400.0)
RBC: 4.04 Mil/uL (ref 3.87–5.11)
RDW: 15.8 % — AB (ref 11.5–15.5)
WBC: 7.8 10*3/uL (ref 4.0–10.5)

## 2017-04-12 LAB — COMPREHENSIVE METABOLIC PANEL
ALK PHOS: 73 U/L (ref 39–117)
ALT: 9 U/L (ref 0–35)
AST: 15 U/L (ref 0–37)
Albumin: 3.8 g/dL (ref 3.5–5.2)
BILIRUBIN TOTAL: 0.8 mg/dL (ref 0.2–1.2)
BUN: 18 mg/dL (ref 6–23)
CALCIUM: 9.7 mg/dL (ref 8.4–10.5)
CO2: 31 mEq/L (ref 19–32)
CREATININE: 0.88 mg/dL (ref 0.40–1.20)
Chloride: 104 mEq/L (ref 96–112)
GFR: 63.32 mL/min (ref >60.00)
GLUCOSE: 104 mg/dL — AB (ref 70–99)
Potassium: 4.1 mEq/L (ref 3.5–5.1)
Sodium: 143 mEq/L (ref 135–145)
TOTAL PROTEIN: 5.9 g/dL — AB (ref 6.0–8.3)

## 2017-04-12 LAB — TSH: TSH: 0.93 u[IU]/mL (ref 0.35–4.50)

## 2017-04-19 ENCOUNTER — Emergency Department (HOSPITAL_COMMUNITY): Payer: Medicare Other

## 2017-04-19 ENCOUNTER — Telehealth: Payer: Self-pay | Admitting: Internal Medicine

## 2017-04-19 ENCOUNTER — Encounter (HOSPITAL_COMMUNITY): Payer: Self-pay | Admitting: Nurse Practitioner

## 2017-04-19 ENCOUNTER — Emergency Department (HOSPITAL_COMMUNITY)
Admission: EM | Admit: 2017-04-19 | Discharge: 2017-04-19 | Disposition: A | Discharge status: Home / Self Care | Payer: Medicare Other | Attending: Emergency Medicine | Admitting: Emergency Medicine

## 2017-04-19 DIAGNOSIS — Z79899 Other long term (current) drug therapy: Secondary | ICD-10-CM | POA: Insufficient documentation

## 2017-04-19 DIAGNOSIS — E039 Hypothyroidism, unspecified: Secondary | ICD-10-CM | POA: Diagnosis not present

## 2017-04-19 DIAGNOSIS — S0083XA Contusion of other part of head, initial encounter: Secondary | ICD-10-CM | POA: Insufficient documentation

## 2017-04-19 DIAGNOSIS — M47812 Spondylosis without myelopathy or radiculopathy, cervical region: Secondary | ICD-10-CM | POA: Insufficient documentation

## 2017-04-19 DIAGNOSIS — I10 Essential (primary) hypertension: Secondary | ICD-10-CM | POA: Diagnosis not present

## 2017-04-19 DIAGNOSIS — W0110XA Fall on same level from slipping, tripping and stumbling with subsequent striking against unspecified object, initial encounter: Secondary | ICD-10-CM | POA: Diagnosis not present

## 2017-04-19 DIAGNOSIS — Z87891 Personal history of nicotine dependence: Secondary | ICD-10-CM | POA: Diagnosis not present

## 2017-04-19 DIAGNOSIS — M1711 Unilateral primary osteoarthritis, right knee: Secondary | ICD-10-CM | POA: Insufficient documentation

## 2017-04-19 DIAGNOSIS — Y939 Activity, unspecified: Secondary | ICD-10-CM | POA: Insufficient documentation

## 2017-04-19 DIAGNOSIS — G4489 Other headache syndrome: Secondary | ICD-10-CM | POA: Diagnosis not present

## 2017-04-19 DIAGNOSIS — Y92129 Unspecified place in nursing home as the place of occurrence of the external cause: Secondary | ICD-10-CM | POA: Diagnosis not present

## 2017-04-19 DIAGNOSIS — S0990XA Unspecified injury of head, initial encounter: Secondary | ICD-10-CM

## 2017-04-19 DIAGNOSIS — S8001XA Contusion of right knee, initial encounter: Secondary | ICD-10-CM | POA: Insufficient documentation

## 2017-04-19 DIAGNOSIS — Y999 Unspecified external cause status: Secondary | ICD-10-CM | POA: Insufficient documentation

## 2017-04-19 DIAGNOSIS — M25561 Pain in right knee: Secondary | ICD-10-CM | POA: Diagnosis not present

## 2017-04-19 DIAGNOSIS — S199XXA Unspecified injury of neck, initial encounter: Secondary | ICD-10-CM | POA: Diagnosis not present

## 2017-04-19 MED ORDER — ONDANSETRON HCL 4 MG/2ML IJ SOLN
4.0000 mg | Freq: Once | INTRAMUSCULAR | Status: AC
  Administered 2017-04-19: 4 mg via INTRAVENOUS

## 2017-04-19 MED ORDER — ONDANSETRON HCL 8 MG PO TABS: 8.0000 mg | ORAL_TABLET | Freq: Three times a day (TID) | ORAL | 0 refills | Status: AC | PRN

## 2017-04-19 MED ORDER — ONDANSETRON 8 MG PO TBDP
8.0000 mg | ORAL_TABLET | Freq: Once | ORAL | Status: AC
  Administered 2017-04-19: 8 mg via ORAL
  Filled 2017-04-19: qty 1

## 2017-04-19 MED ORDER — ONDANSETRON HCL 4 MG/2ML IJ SOLN
INTRAMUSCULAR | Status: AC
  Filled 2017-04-19: qty 2

## 2017-04-19 NOTE — ED Notes (Signed)
Pt wheeled out and taken back to [facility] with their [employer]. Daughter accompanied pt out. A&Ox4.

## 2017-04-19 NOTE — Discharge Instructions (Signed)
Use ice on the sore spots 3 or 4 times a day for several days.  Use the Ace wrap on the right knee as needed for comfort.  Take Tylenol as needed for pain.  Return here, if needed, for problems.

## 2017-04-19 NOTE — Telephone Encounter (Signed)
[facility] wanted to let the MD know that the patient fell and hit her head and has a big contusion. They called 911 and she's being transported to the hospital. They don't know which hospital that she is going to yet.

## 2017-04-19 NOTE — ED Triage Notes (Signed)
Patient was headed to the bathroom in her apartment at an assisted living fac. [facility]. Patient bent down to pick something up ff the floor and fell head first. NO LOC. No complaints of neck pain.

## 2017-04-19 NOTE — ED Provider Notes (Signed)
[facility] DEPT Provider Note   CSN: [id number] Arrival date & time: 04/19/17  1313     History   Chief Complaint No chief complaint on file.   HPI Susan [name] is a 83 y.o. female.  The patient was at her assisted living facility today, bent over to pick something up something from the floor, and tumbled forward striking her head.  She could not get up because of weakness and discomfort.  She was found about an hour later by staff members who called EMS who transferred her here.  The patient complains of pain in her forehead, and her right knee only.  She denies weakness, dizziness, preceding symptoms, fever, chills, nausea, vomiting, change in bowel or urinary habits.  There are no other known modifying factors.  She is here with family members who concur with the history.   HPI  Past Medical History:  Diagnosis Date  . Aortic stenosis   . Atrial fibrillation, new onset ([place])   . CARCINOMA, BLADDER, TRANSITIONAL CELL 10/24/2007  . GERD (gastroesophageal reflux disease)   . HYPERTENSION 01/07/2007  . HYPOTHYROIDISM 01/07/2007  . IBS 05/03/2008  . MIXED HEARING LOSS BILATERAL 09/28/2009  . OSTEOARTHRITIS, KNEE 04/25/2007  . UNSPECIFIED ARTHROPATHY MULTIPLE SITES 10/26/2009    Patient Active Problem List   Diagnosis Date Noted  . Paroxysmal atrial fibrillation ([place]) 04/30/2016  . Atrial fibrillation with RVR ([place])   . Rectal bleeding 05/19/2014  . Hemorrhoid 05/19/2014  . Esophageal reflux 05/19/2014  . Pure sensory lacunar syndrome 01/29/2013  . Anemia 11/10/2012  . Aortic stenosis 07/26/2011  . Bruit 07/26/2011  . Dizziness 06/27/2011  . MIXED HEARING LOSS BILATERAL 09/28/2009  . IBS 05/03/2008  . CARCINOMA, BLADDER, TRANSITIONAL CELL 10/24/2007  . Osteoarthrosis involving lower leg 04/25/2007  . Hypothyroidism 01/07/2007  . Essential hypertension 01/07/2007    Past Surgical History:  Procedure Laterality Date  . BELPHAROPTOSIS REPAIR    . BLADDER TUMOR  EXCISION    . BUNIONECTOMY     bilateral  . CATARACT EXTRACTION     bilateral  . TONSILLECTOMY AND ADENOIDECTOMY     x2    OB History    No data available       Home Medications    Prior to Admission medications   Medication Sig Start Date End Date Taking? Authorizing Provider  acetaminophen (TYLENOL) 325 MG tablet Take 2 tablets (650 mg total) by mouth 3 (three) times Zumbro. 04/11/17  Yes Marletta Lor, MD  atorvastatin (LIPITOR) 20 MG tablet TAKE 1 TABLET ONCE Trusty. 08/13/16  Yes Marletta Lor, MD  carvedilol (COREG) 12.5 MG tablet TAKE  (1)  TABLET TWICE A DAY WITH MEALS (BREAKFAST AND SUPPER) 08/13/16  Yes Marletta Lor, MD  famotidine (PEPCID) 20 MG tablet TAKE 1 TABLET TWICE Sangalang. 07/30/16  Yes Milus Banister, MD  Glucosamine-Chondroitin 750-600 MG TABS Take 1 tablet by mouth 3 (three) times Senn.   Yes [provider]  levothyroxine (SYNTHROID, LEVOTHROID) 75 MCG tablet Take 0.5 tablets (37.5 mcg total) by mouth [place] before breakfast. 03/29/16  Yes Wert, Christina, NP  Loperamide HCl 2 MG CHEW 1 tablet every 6 hours as needed for diarrhea.  May take as a preventive measure prior to visits out of the facility 02/26/17  Yes Marletta Lor, MD  Multiple Vitamin (MULTIVITAMIN) tablet Take 2 tablets by mouth Morel.    Yes [provider]  Psyllium 400 MG CAPS Take 400 mg by mouth 2 (two) times Robitaille.  WITH 4-6 OZ OF WATER   Yes [provider]  aspirin 81 MG tablet Take 1 tablet (81 mg total) by mouth Slone. Patient not taking: Reported on 04/19/2017 01/14/13   Marletta Lor, MD  calcium carbonate (TUMS) 500 MG chewable tablet Chew 1 tablet by mouth as needed.      [provider]  glucosamine-chondroitin 500-400 MG tablet Take 1 tablet by mouth 3 (three) times Mancillas. For bones and joints Patient not taking: Reported on 04/19/2017 04/11/17   Marletta Lor, MD  hydrocortisone (ANUSOL-HC) 2.5 % rectal cream Place  1 application rectally 2 (two) times Brault as needed.    [provider]    Family History Family History  Problem Relation Age of Onset  . Stomach cancer Mother   . Arthritis Mother   . Leukemia Father   . Kidney failure Father   . Anuerysm Brother        brain  . ALS Sister   . Stomach cancer Maternal Aunt   . Colon cancer Daughter   . Esophageal cancer Neg Hx   . Rectal cancer Neg Hx     Social History Social History  Substance Use Topics  . Smoking status: Former Smoker    Packs/day: 0.50    Years: 20.00    Types: Cigarettes    Quit date: 09/05/1973  . Smokeless tobacco: Never Used  . Alcohol use Yes     Comment: ocass     Allergies   Penicillins; Ace inhibitors; Aspirin; Hydrocodone; and Nsaids   Review of Systems Review of Systems  All other systems reviewed and are negative.    Physical Exam Updated Vital Signs BP (!) 180/92 (BP Location: Right Arm)   Pulse 85   Temp 98 F (36.7 C) (Oral)   Resp 16   Ht 5\' 4"  (1.626 m)   Wt 70.3 kg (155 lb)   SpO2 97%   BMI 26.61 kg/m   Physical Exam  Constitutional: She appears well-developed. No distress.  Elderly, frail  HENT:  Head: Normocephalic.  Right forehead contusion with small nonbleeding abrasion over it.  No associated crepitation.  Eyes: Pupils are equal, round, and reactive to light. Conjunctivae and EOM are normal.  Neck: Normal range of motion and phonation normal. Neck supple.  Cardiovascular: Normal rate and regular rhythm.   Pulmonary/Chest: Effort normal and breath sounds normal. She exhibits no tenderness.  Abdominal: Soft. She exhibits no distension. There is no tenderness. There is no guarding.  Musculoskeletal: Normal range of motion.  Right knee is tender and swollen, with pain on flexion.  She is able to lift both legs independently off the stretcher, without limitation.  Neurological: She is alert. She exhibits normal muscle tone.  Skin: Skin is warm and dry.    Psychiatric: She has a normal mood and affect. Her behavior is normal.  Nursing note and vitals reviewed.    ED Treatments / Results  Labs (all labs ordered are listed, but only abnormal results are displayed) Labs Reviewed - No data to display  EKG  EKG Interpretation None       Radiology Ct Head Wo Contrast  Result Date: 04/19/2017 CLINICAL DATA:  [place] and hit head. EXAM: CT HEAD WITHOUT CONTRAST CT CERVICAL SPINE WITHOUT CONTRAST TECHNIQUE: Multidetector CT imaging of the head and cervical spine was performed following the standard protocol without intravenous contrast. Multiplanar CT image reconstructions of the cervical spine were also generated. COMPARISON:  None. 07/17/2016 FINDINGS: CT HEAD FINDINGS  Brain: Mild diffuse cerebral atrophy is unchanged. Stable low-density in the periventricular white matter. No evidence for acute hemorrhage, mass lesion, midline shift, hydrocephalus or large infarct. Vascular: No hyperdense vessel or unexpected calcification. Skull: Normal. Negative for fracture or focal lesion. Sinuses/Orbits: No acute finding. Other: Soft tissue swelling and hematoma formation in the forehead. CT CERVICAL SPINE FINDINGS Alignment: Minimal anterolisthesis at C7-T1. Skull base and vertebrae: No acute fracture. Soft tissues and spinal canal: No prevertebral fluid or swelling. No visible canal hematoma. Disc levels: Severe degenerative disease throughout the cervical spine. Severe bilateral facet arthropathy. Severe disc space narrowing and endplate disease at U4-Q0 and C6-C7. Severe disc space disease at T1-T2. Severe degenerative changes along the anterior aspect of C1-C2. Severe bilateral foraminal narrowing at C3-C4. Severe left foraminal narrowing at C4-C5. Bilateral foraminal narrowing at C5-C6. Upper chest: Apical scarring.  No pneumothorax. Other: None IMPRESSION: No acute intracranial abnormality. Soft tissue swelling in the forehead without a fracture. Atrophy and  evidence for chronic small vessel ischemic disease. Severe degenerative disease in cervical spine. No acute bone abnormality in the cervical spine. Electronically Signed   By: Markus Daft M.D.   On: 04/19/2017 15:37   Ct Cervical Spine Wo Contrast  Result Date: 04/19/2017 CLINICAL DATA:  [place] and hit head. EXAM: CT HEAD WITHOUT CONTRAST CT CERVICAL SPINE WITHOUT CONTRAST TECHNIQUE: Multidetector CT imaging of the head and cervical spine was performed following the standard protocol without intravenous contrast. Multiplanar CT image reconstructions of the cervical spine were also generated. COMPARISON:  None. 07/17/2016 FINDINGS: CT HEAD FINDINGS Brain: Mild diffuse cerebral atrophy is unchanged. Stable low-density in the periventricular white matter. No evidence for acute hemorrhage, mass lesion, midline shift, hydrocephalus or large infarct. Vascular: No hyperdense vessel or unexpected calcification. Skull: Normal. Negative for fracture or focal lesion. Sinuses/Orbits: No acute finding. Other: Soft tissue swelling and hematoma formation in the forehead. CT CERVICAL SPINE FINDINGS Alignment: Minimal anterolisthesis at C7-T1. Skull base and vertebrae: No acute fracture. Soft tissues and spinal canal: No prevertebral fluid or swelling. No visible canal hematoma. Disc levels: Severe degenerative disease throughout the cervical spine. Severe bilateral facet arthropathy. Severe disc space narrowing and endplate disease at H4-V4 and C6-C7. Severe disc space disease at T1-T2. Severe degenerative changes along the anterior aspect of C1-C2. Severe bilateral foraminal narrowing at C3-C4. Severe left foraminal narrowing at C4-C5. Bilateral foraminal narrowing at C5-C6. Upper chest: Apical scarring.  No pneumothorax. Other: None IMPRESSION: No acute intracranial abnormality. Soft tissue swelling in the forehead without a fracture. Atrophy and evidence for chronic small vessel ischemic disease. Severe degenerative disease in  cervical spine. No acute bone abnormality in the cervical spine. Electronically Signed   By: Markus Daft M.D.   On: 04/19/2017 15:37   Dg Knee Complete 4 Views Right  Result Date: 04/19/2017 CLINICAL DATA:  Fall with right knee pain EXAM: RIGHT KNEE - COMPLETE 4+ VIEW COMPARISON:  None. FINDINGS: There is no acute fracture or dislocation of the right knee. There is severe tricompartmental osteoarthrosis with advanced loss of both femorotibial joint spaces with associated osteophyte formation and subchondral sclerosis. The patellofemoral joint is also narrowed with marginal osteophytes. There is a medium-sized suprapatellar effusion. IMPRESSION: 1. Medium-sized suprapatellar effusion without acute osseous abnormality. 2. Severe tricompartmental right knee osteoarthrosis. Electronically Signed   By: Ulyses Jarred M.D.   On: 04/19/2017 15:15    Procedures Procedures (including critical care time)  Medications Ordered in ED Medications  ondansetron (ZOFRAN) injection 4 mg (  Intravenous Canceled Entry 04/19/17 1452)     Initial Impression / Assessment and Plan / ED Course  I have reviewed the triage vital signs and the nursing notes.  Pertinent labs & imaging results that were available during my care of the patient were reviewed by me and considered in my medical decision making (see chart for details).      Patient Vitals for the past 24 hrs:  BP Temp Temp src Pulse Resp SpO2 Height Weight  04/19/17 1335 (!) 180/92 98 F (36.7 C) Oral 85 16 97 % - -  04/19/17 1322 - - - - - - 5\' 4"  (1.626 m) 70.3 kg (155 lb)    4:42 PM Reevaluation with update and discussion. After initial assessment and treatment, an updated evaluation reveals she is alert and comfortable has no further complaints.  Findings discussed with patient and daughter, all questions were answered. Susan [name]      Final Clinical Impressions(s) / ED Diagnoses   Final diagnoses:  Contusion of face, initial encounter    Injury of head, initial encounter  Osteoarthritis of cervical spine, unspecified spinal osteoarthritis complication status  Osteoarthritis of right knee, unspecified osteoarthritis type  Contusion of right knee, initial encounter    Apparent mechanical fall with contusions and mild head injury.  Underlying degenerative joint changes of the cervical spine, and right knee.  Doubt fracture, spinal myelopathy, serious intracranial injury or illness causing fall.  Nursing Notes Reviewed/ Care Coordinated Applicable Imaging Reviewed Interpretation of Laboratory Data incorporated into ED treatment  The patient appears reasonably screened and/or stabilized for discharge and I doubt any other medical condition or other [facility] requiring further screening, evaluation, or treatment in the ED at this time prior to discharge.  Plan: Home Medications-continue usual medications, APAP for pain; Home Treatments-rest, cryotherapy, Ace wrap for right knee, progressive ambulation; return here if the recommended treatment, does not improve the symptoms; Recommended follow up-PCP prn   New Prescriptions New Prescriptions   No medications on file     Daleen Bo, MD 04/19/17 1644

## 2017-04-22 NOTE — Telephone Encounter (Signed)
Please note

## 2017-05-02 DIAGNOSIS — Z23 Encounter for immunization: Secondary | ICD-10-CM | POA: Diagnosis not present

## 2017-06-11 ENCOUNTER — Ambulatory Visit (INDEPENDENT_AMBULATORY_CARE_PROVIDER_SITE_OTHER): Payer: Medicare Other | Admitting: Internal Medicine

## 2017-06-11 ENCOUNTER — Encounter: Payer: Self-pay | Admitting: Internal Medicine

## 2017-06-11 VITALS — BP 132/80 | HR 75 | Temp 97.7°F | Ht 64.0 in

## 2017-06-11 DIAGNOSIS — I4891 Unspecified atrial fibrillation: Secondary | ICD-10-CM

## 2017-06-11 DIAGNOSIS — I10 Essential (primary) hypertension: Secondary | ICD-10-CM | POA: Diagnosis not present

## 2017-06-11 DIAGNOSIS — I350 Nonrheumatic aortic (valve) stenosis: Secondary | ICD-10-CM | POA: Diagnosis not present

## 2017-06-11 NOTE — Patient Instructions (Signed)
Limit your sodium (Salt) intake  Return in 6 months for follow-up  

## 2017-06-11 NOTE — Progress Notes (Signed)
Subjective:    Patient ID: Susan [name], female    DOB: 07-Feb-1919, 83 y.o.   MRN: [id number]  HPI 83 year old patient who is seen today in follow-up. She continues to do amazingly well.  She does have a history of aortic stenosis and paroxysmal atrial fibrillation that has been stable. Her chief concern is possible diabetes.  She does have some nocturia although admits to liberal fluid intake.  She also describes some nonspecific tingling of the fingertips as she was concerned of these 2 symptoms might be signs of diabetes Patient has never had any hyperglycemia blood sugars have been normal as recently as 2 months ago    Past Medical History:  Diagnosis Date  . Aortic stenosis   . Atrial fibrillation, new onset ([place])   . CARCINOMA, BLADDER, TRANSITIONAL CELL 10/24/2007  . GERD (gastroesophageal reflux disease)   . HYPERTENSION 01/07/2007  . HYPOTHYROIDISM 01/07/2007  . IBS 05/03/2008  . MIXED HEARING LOSS BILATERAL 09/28/2009  . OSTEOARTHRITIS, KNEE 04/25/2007  . UNSPECIFIED ARTHROPATHY MULTIPLE SITES 10/26/2009     Social History   Socioeconomic History  . Marital status: Widowed    Spouse name: Not on file  . Number of children: 3  . Years of education: Not on file  . Highest education level: Not on file  Social Needs  . Financial resource strain: Not on file  . Food insecurity - worry: Not on file  . Food insecurity - inability: Not on file  . Transportation needs - medical: Not on file  . Transportation needs - non-medical: Not on file  Occupational History  . Occupation: retired    Fish farm manager: RETIRED  Tobacco Use  . Smoking status: Former Smoker    Packs/day: 0.50    Years: 20.00    Pack years: 10.00    Types: Cigarettes    Last attempt to quit: 09/05/1973    Years since quitting: 43.7  . Smokeless tobacco: Never Used  Substance and Sexual Activity  . Alcohol use: Yes    Comment: ocass  . Drug use: No  . Sexual activity: Not on file  Other Topics Concern    . Not on file  Social History Narrative  . Not on file    Past Surgical History:  Procedure Laterality Date  . BELPHAROPTOSIS REPAIR    . BLADDER TUMOR EXCISION    . BUNIONECTOMY     bilateral  . CATARACT EXTRACTION     bilateral  . TONSILLECTOMY AND ADENOIDECTOMY     x2    Family History  Problem Relation Age of Onset  . Stomach cancer Mother   . Arthritis Mother   . Leukemia Father   . Kidney failure Father   . Anuerysm Brother        brain  . ALS Sister   . Stomach cancer Maternal Aunt   . Colon cancer Daughter   . Esophageal cancer Neg Hx   . Rectal cancer Neg Hx     Allergies  Allergen Reactions  . Penicillins Diarrhea    Has patient had a PCN reaction causing immediate rash, facial/tongue/throat swelling, SOB or lightheadedness with hypotension: YES Has patient had a PCN reaction causing severe rash involving mucus membranes or skin necrosis:NO Has patient had a PCN reaction that required hospitalization NO Has patient had a PCN reaction occurring within the last 10 years: NO If all of the above answers are "NO", then may proceed with Cephalosporin use.  . Ace Inhibitors  REACTION: cough  . Aspirin   . Hydrocodone     REACTION: Hallucinations---minor  . Nsaids     Current Outpatient Medications on File Prior to Visit  Medication Sig Dispense Refill  . acetaminophen (TYLENOL) 325 MG tablet Take 2 tablets (650 mg total) by mouth 3 (three) times Bucher.    Marland Kitchen aspirin 81 MG tablet Take 1 tablet (81 mg total) by mouth Crymes. 30 tablet   . atorvastatin (LIPITOR) 20 MG tablet TAKE 1 TABLET ONCE Holzheimer. 90 tablet 0  . calcium carbonate (TUMS) 500 MG chewable tablet Chew 1 tablet by mouth as needed.      . carvedilol (COREG) 12.5 MG tablet TAKE  (1)  TABLET TWICE A DAY WITH MEALS (BREAKFAST AND SUPPER) 180 tablet 0  . famotidine (PEPCID) 20 MG tablet TAKE 1 TABLET TWICE Cansler. 60 tablet 0  . Glucosamine-Chondroitin 750-600 MG TABS Take 1 tablet by mouth 3  (three) times Selmer.    . hydrocortisone (ANUSOL-HC) 2.5 % rectal cream Place 1 application rectally 2 (two) times Lopezgarcia as needed.    Marland Kitchen levothyroxine (SYNTHROID, LEVOTHROID) 75 MCG tablet Take 0.5 tablets (37.5 mcg total) by mouth [place] before breakfast. 30 tablet 0  . Loperamide HCl 2 MG CHEW 1 tablet every 6 hours as needed for diarrhea.  May take as a preventive measure prior to visits out of the facility 30 tablet 3  . Multiple Vitamin (MULTIVITAMIN) tablet Take 2 tablets by mouth Scheu.     . ondansetron (ZOFRAN) 8 MG tablet Take 1 tablet (8 mg total) by mouth every 8 (eight) hours as needed for nausea or vomiting. 20 tablet 0  . Psyllium 400 MG CAPS Take 400 mg by mouth 2 (two) times Alabi. WITH 4-6 OZ OF WATER     No current facility-administered medications on file prior to visit.     BP 132/80 (BP Location: Left Arm, Patient Position: Sitting, Cuff Size: Normal)   Pulse 75   Temp 97.7 F (36.5 C) (Oral)   Ht 5\' 4"  (1.626 m)   SpO2 95%   BMI 26.61 kg/m      Review of Systems  Constitutional: Negative.   HENT: Negative for congestion, dental problem, hearing loss, rhinorrhea, sinus pressure, sore throat and tinnitus.   Eyes: Negative for pain, discharge and visual disturbance.  Respiratory: Negative for cough and shortness of breath.   Cardiovascular: Negative for chest pain, palpitations and leg swelling.  Gastrointestinal: Negative for abdominal distention, abdominal pain, blood in stool, constipation, diarrhea, nausea and vomiting.  Endocrine: Positive for polyuria.  Genitourinary: Negative for difficulty urinating, dysuria, flank pain, frequency, hematuria, pelvic pain, urgency, vaginal bleeding, vaginal discharge and vaginal pain.  Musculoskeletal: Negative for arthralgias, gait problem and joint swelling.  Skin: Negative for rash.  Neurological: Positive for numbness. Negative for dizziness, syncope, speech difficulty, weakness and headaches.  Hematological: Negative  for adenopathy.  Psychiatric/Behavioral: Negative for agitation, behavioral problems and dysphoric mood. The patient is not nervous/anxious.        Objective:   Physical Exam  Constitutional: She is oriented to person, place, and time. She appears well-developed and well-nourished. No distress.  Alert No distress wheelchair-bound    HENT:  Head: Normocephalic.  Right Ear: External ear normal.  Left Ear: External ear normal.  Mouth/Throat: Oropharynx is clear and moist.  Eyes: Conjunctivae and EOM are normal. Pupils are equal, round, and reactive to light.  Neck: Normal range of motion. Neck supple. No thyromegaly present.  Cardiovascular: Normal rate,  regular rhythm and intact distal pulses.  Murmur heard. Pulmonary/Chest: Effort normal and breath sounds normal.  Abdominal: Soft. Bowel sounds are normal. She exhibits no mass. There is no tenderness.  Musculoskeletal: Normal range of motion.  Lymphadenopathy:    She has no cervical adenopathy.  Neurological: She is alert and oriented to person, place, and time.  Skin: Skin is warm and dry. No rash noted.  Skin of the lower extremities slightly dry and flaky  Psychiatric: She has a normal mood and affect. Her behavior is normal.          Assessment & Plan:  Aortic stenosis stable Dyslipidemia continue statin therapy Hypothyroidism  Follow-up 6 months  KWIATKOWSKI,PETER Pilar Plate

## 2017-07-31 DIAGNOSIS — D485 Neoplasm of uncertain behavior of skin: Secondary | ICD-10-CM | POA: Diagnosis not present

## 2017-07-31 DIAGNOSIS — Z85828 Personal history of other malignant neoplasm of skin: Secondary | ICD-10-CM | POA: Diagnosis not present

## 2017-07-31 DIAGNOSIS — L720 Epidermal cyst: Secondary | ICD-10-CM | POA: Diagnosis not present

## 2017-09-26 ENCOUNTER — Other Ambulatory Visit: Payer: Self-pay

## 2017-09-26 ENCOUNTER — Emergency Department (HOSPITAL_COMMUNITY)
Admission: EM | Admit: 2017-09-26 | Discharge: 2017-09-26 | Disposition: A | Discharge status: Home / Self Care | Payer: Medicare Other | Attending: Emergency Medicine | Admitting: Emergency Medicine

## 2017-09-26 ENCOUNTER — Encounter (HOSPITAL_COMMUNITY): Payer: Self-pay | Admitting: Emergency Medicine

## 2017-09-26 ENCOUNTER — Ambulatory Visit: Payer: Self-pay

## 2017-09-26 DIAGNOSIS — R531 Weakness: Secondary | ICD-10-CM | POA: Diagnosis not present

## 2017-09-26 DIAGNOSIS — Z79899 Other long term (current) drug therapy: Secondary | ICD-10-CM | POA: Diagnosis not present

## 2017-09-26 DIAGNOSIS — Z7982 Long term (current) use of aspirin: Secondary | ICD-10-CM | POA: Diagnosis not present

## 2017-09-26 DIAGNOSIS — Z87891 Personal history of nicotine dependence: Secondary | ICD-10-CM | POA: Insufficient documentation

## 2017-09-26 DIAGNOSIS — R42 Dizziness and giddiness: Secondary | ICD-10-CM | POA: Insufficient documentation

## 2017-09-26 DIAGNOSIS — R404 Transient alteration of awareness: Secondary | ICD-10-CM | POA: Diagnosis not present

## 2017-09-26 DIAGNOSIS — E039 Hypothyroidism, unspecified: Secondary | ICD-10-CM | POA: Insufficient documentation

## 2017-09-26 DIAGNOSIS — I10 Essential (primary) hypertension: Secondary | ICD-10-CM | POA: Diagnosis not present

## 2017-09-26 DIAGNOSIS — W19XXXA Unspecified fall, initial encounter: Secondary | ICD-10-CM

## 2017-09-26 LAB — BASIC METABOLIC PANEL
ANION GAP: 10 (ref 5–15)
BUN: 11 mg/dL (ref 6–20)
CHLORIDE: 107 mmol/L (ref 101–111)
CO2: 26 mmol/L (ref 22–32)
Calcium: 9.5 mg/dL (ref 8.9–10.3)
Creatinine, Ser: 0.67 mg/dL (ref 0.44–1.00)
GFR calc non Af Amer: >60 mL/min (ref >60)
GLUCOSE: 98 mg/dL (ref 65–99)
Potassium: 4 mmol/L (ref 3.5–5.1)
Sodium: 143 mmol/L (ref 135–145)

## 2017-09-26 LAB — CBC
HCT: 36.9 % (ref 36.0–46.0)
HEMOGLOBIN: 11.5 g/dL — AB (ref 12.0–15.0)
MCH: 29.4 pg (ref 26.0–34.0)
MCHC: 31.2 g/dL (ref 30.0–36.0)
MCV: 94.4 fL (ref 78.0–100.0)
Platelets: 177 10*3/uL (ref 150–400)
RBC: 3.91 MIL/uL (ref 3.87–5.11)
RDW: 15.6 % — ABNORMAL HIGH (ref 11.5–15.5)
WBC: 9.3 10*3/uL (ref 4.0–10.5)

## 2017-09-26 LAB — CBG MONITORING, ED: GLUCOSE-CAPILLARY: 89 mg/dL (ref 65–99)

## 2017-09-26 LAB — URINALYSIS, ROUTINE W REFLEX MICROSCOPIC
BILIRUBIN URINE: NEGATIVE
GLUCOSE, UA: NEGATIVE mg/dL
HGB URINE DIPSTICK: NEGATIVE
Ketones, ur: NEGATIVE mg/dL
Leukocytes, UA: NEGATIVE
Nitrite: NEGATIVE
PH: 6 (ref 5.0–8.0)
Protein, ur: NEGATIVE mg/dL
SPECIFIC GRAVITY, URINE: 1.006 (ref 1.005–1.030)

## 2017-09-26 NOTE — Telephone Encounter (Signed)
Spoke to [employer] from [facility] and informed her to take te patient to the ER because rehab will not help the situation per Dr.Kwiatkowski. Beth verbalized understanding. No further action needed.

## 2017-09-26 NOTE — ED Notes (Signed)
Pt will wait in waiting room with daughter.

## 2017-09-26 NOTE — Telephone Encounter (Signed)
rec'd phone call from nurse at [facility].  Reported pt. Has fallen 5 times in past 3 weeks.  Stated she fell this morning at 7:10 AM.  Reported there was no injury.  Nurse has noted progressive weakness that has increased this morning.  Stated pt. C/o dizziness, fatigue, and has elevated BP.  Reported BP 180/90 and 178/78.  Reported pt. Showing some decline in cognitive function, and feels her falls are related to "unsafe practices."  Is requesting that Dr. Burnice Logan consider to transfer pt. to their Rehab area.  Recommended an office visit for evaluation of condition, or poss. Need to be evaluated at the ER.  Nurse reported "I don't think she needs to go to the hospital, but I don't know how we would get her in to the office, since she is so weak."  Advised will make Dr. Raliegh Ip aware of pt's condition, and will contact nurse with recommendations.  Agrees with plan.      Reason for Disposition . [1] MODERATE weakness (i.e., interferes with work, school, normal activities) AND [2] cause unknown  (Exceptions: weakness with acute minor illness, or weakness from poor fluid intake)  Answer Assessment - Initial Assessment Questions 1. DESCRIPTION: "Describe how you are feeling."     Noticeably weaker 2. SEVERITY: "How bad is it?"  "Can you stand and walk?"   - MILD - Feels weak or tired, but does not interfere with work, school or normal activities   - [place] to stand and walk; weakness interferes with work, school, or normal activities   - SEVERE - Unable to stand or walk    Progression of weakness; increased today; fell at 7:10 AM today 3. ONSET:  "When did the weakness begin?"     Over time 4. CAUSE: "What do you think is causing the weakness?"     unknown 5. MEDICINES: "Have you recently started a new medicine or had a change in the amount of a medicine?"    None  6. OTHER SYMPTOMS: "Do you have any other symptoms?" (e.g., chest pain, fever, cough, SOB, vomiting, diarrhea,  bleeding)     Dizziness, fatigue, BP elevated 7. PREGNANCY: "Is there any chance you are pregnant?" "When was your last menstrual period?"     no  Protocols used: WEAKNESS (GENERALIZED) AND FATIGUE-A-AH

## 2017-09-26 NOTE — ED Provider Notes (Signed)
[facility] EMERGENCY DEPARTMENT Provider Note   CSN: [id number] Arrival date & time: 09/26/17  1107     History   Chief Complaint Chief Complaint  Patient presents with  . Dizziness  . Fall  . Weakness    HPI Wm Fruchter Sainz is a 84 y.o. female.  HPI presents with her daughter who assists with the HPI. Patient is a generally well and 84 year old female who lives in an assisted living facility. She acknowledges having frequent falls, and today, had a similar fall. She notes that she was dizzy following a fall, and per report was seen by others to be weaker than usual.  When she was sent here for evaluation. Patient states that she was turning after showering, slipped, falling onto her right arm. She denies head trauma, or loss of consciousness. She denies focal pain currently, or weakness in any particular area currently. She does have multiple medical illnesses, but notes that everything is been essentially unchanged.  Dizziness resolved after several moments, and the patient herself denies any focal or generalized weakness following the fall, though this was observed.    Past Medical History:  Diagnosis Date  . Aortic stenosis   . Atrial fibrillation, new onset ([place])   . CARCINOMA, BLADDER, TRANSITIONAL CELL 10/24/2007  . GERD (gastroesophageal reflux disease)   . HYPERTENSION 01/07/2007  . HYPOTHYROIDISM 01/07/2007  . IBS 05/03/2008  . MIXED HEARING LOSS BILATERAL 09/28/2009  . OSTEOARTHRITIS, KNEE 04/25/2007  . UNSPECIFIED ARTHROPATHY MULTIPLE SITES 10/26/2009    Patient Active Problem List   Diagnosis Date Noted  . Paroxysmal atrial fibrillation ([place]) 04/30/2016  . Atrial fibrillation with RVR ([place])   . Rectal bleeding 05/19/2014  . Hemorrhoid 05/19/2014  . Esophageal reflux 05/19/2014  . Pure sensory lacunar syndrome 01/29/2013  . Anemia 11/10/2012  . Aortic stenosis 07/26/2011  . Bruit 07/26/2011  . Dizziness 06/27/2011  . MIXED HEARING  LOSS BILATERAL 09/28/2009  . IBS 05/03/2008  . CARCINOMA, BLADDER, TRANSITIONAL CELL 10/24/2007  . Osteoarthrosis involving lower leg 04/25/2007  . Hypothyroidism 01/07/2007  . Essential hypertension 01/07/2007    Past Surgical History:  Procedure Laterality Date  . BELPHAROPTOSIS REPAIR    . BLADDER TUMOR EXCISION    . BUNIONECTOMY     bilateral  . CATARACT EXTRACTION     bilateral  . TONSILLECTOMY AND ADENOIDECTOMY     x2    OB History   None      Home Medications    Prior to Admission medications   Medication Sig Start Date End Date Taking? Authorizing Provider  acetaminophen (TYLENOL) 325 MG tablet Take 2 tablets (650 mg total) by mouth 3 (three) times Holmer. 04/11/17   Marletta Lor, MD  aspirin 81 MG tablet Take 1 tablet (81 mg total) by mouth Huskins. 01/14/13   Marletta Lor, MD  atorvastatin (LIPITOR) 20 MG tablet TAKE 1 TABLET ONCE Recine. 08/13/16   Marletta Lor, MD  calcium carbonate (TUMS) 500 MG chewable tablet Chew 1 tablet by mouth as needed.      [provider]  carvedilol (COREG) 12.5 MG tablet TAKE  (1)  TABLET TWICE A DAY WITH MEALS (BREAKFAST AND SUPPER) 08/13/16   Marletta Lor, MD  famotidine (PEPCID) 20 MG tablet TAKE 1 TABLET TWICE Szafranski. 07/30/16   Milus Banister, MD  Glucosamine-Chondroitin 750-600 MG TABS Take 1 tablet by mouth 3 (three) times Moisan.    [provider]  hydrocortisone (ANUSOL-HC) 2.5 % rectal cream  Place 1 application rectally 2 (two) times Krogh as needed.    [provider]  levothyroxine (SYNTHROID, LEVOTHROID) 75 MCG tablet Take 0.5 tablets (37.5 mcg total) by mouth Beazer before breakfast. 03/29/16   Royal Hawthorn, NP  Loperamide HCl 2 MG CHEW 1 tablet every 6 hours as needed for diarrhea.  May take as a preventive measure prior to visits out of the facility 02/26/17   Marletta Lor, MD  Multiple Vitamin (MULTIVITAMIN) tablet Take 2 tablets by mouth Alvarez.     [provider]  ondansetron (ZOFRAN) 8 MG tablet Take 1 tablet (8 mg total) by mouth every 8 (eight) hours as needed for nausea or vomiting. 04/19/17   Daleen Bo, MD  Psyllium 400 MG CAPS Take 400 mg by mouth 2 (two) times Aigner. WITH 4-6 OZ OF WATER    [provider]    Family History Family History  Problem Relation Age of Onset  . Stomach cancer Mother   . Arthritis Mother   . Leukemia Father   . Kidney failure Father   . Anuerysm Brother        brain  . ALS Sister   . Stomach cancer Maternal Aunt   . Colon cancer Daughter   . Esophageal cancer Neg Hx   . Rectal cancer Neg Hx     Social History Social History   Tobacco Use  . Smoking status: Former Smoker    Packs/day: 0.50    Years: 20.00    Pack years: 10.00    Types: Cigarettes    Last attempt to quit: 09/05/1973    Years since quitting: 44.0  . Smokeless tobacco: Never Used  Substance Use Topics  . Alcohol use: Yes    Comment: ocass  . Drug use: No     Allergies   Penicillins; Ace inhibitors; Aspirin; Hydrocodone; and Nsaids   Review of Systems Review of Systems  Constitutional:       Per HPI, otherwise negative  HENT:       Per HPI, otherwise negative  Respiratory:       Per HPI, otherwise negative  Cardiovascular:       Per HPI, otherwise negative  Gastrointestinal: Negative for vomiting.  Endocrine:       Negative aside from HPI  Genitourinary:       Neg aside from HPI   Musculoskeletal:       Per HPI, otherwise negative  Skin: Negative for wound.  Allergic/Immunologic: Negative for immunocompromised state.  Neurological: Positive for dizziness. Negative for syncope.     Physical Exam Updated Vital Signs BP (!) 143/76 (BP Location: Left Arm)   Pulse 65   Temp 97.8 F (36.6 C) (Oral)   Resp 12   SpO2 98%   Physical Exam  Constitutional: She is oriented to person, place, and time. She appears well-developed and well-nourished. No distress.  HENT:  Head: Normocephalic  and atraumatic.  Eyes: Conjunctivae and EOM are normal.  Cardiovascular: Normal rate and regular rhythm.  Murmur heard. Pulmonary/Chest: Effort normal and breath sounds normal. No stridor. No respiratory distress.  Abdominal: She exhibits no distension.  Musculoskeletal: She exhibits no edema.       Arms: Neurological: She is alert and oriented to person, place, and time. No cranial nerve deficit.  Skin: Skin is warm and dry.  Psychiatric: She has a normal mood and affect.  Nursing note and vitals reviewed.    ED Treatments / Results  Labs (all labs ordered  are listed, but only abnormal results are displayed) Labs Reviewed  BASIC METABOLIC PANEL  CBC  URINALYSIS, ROUTINE W REFLEX MICROSCOPIC  CBG MONITORING, ED    EKG  EKG Interpretation  Date/Time:  Thursday September 26 2017 11:24:38 EDT Ventricular Rate:  67 PR Interval:  158 QRS Duration: 112 QT Interval:  414 QTC Calculation: 437 R Axis:   -28 Text Interpretation:  Normal sinus rhythm more prominent T waves compared to prior, otherwise similar. ST-t wave abnormality Artifact Abnormal ekg Confirmed by Carmin Muskrat [phone]) on 09/26/2017 11:36:06 AM       Radiology No results found.  Procedures Procedures (including critical care time)  Medications Ordered in ED Medications - No data to display   Initial Impression / Assessment and Plan / ED Course  I have reviewed the triage vital signs and the nursing notes.  Pertinent labs & imaging results that were available during my care of the patient were reviewed by me and considered in my medical decision making (see chart for details).    2:20 PM On repeat exam patient is in no distress, sitting upright, speaking clearly, no new complaints per I discussed all findings with her and her daughter. The patient was presenting after fall, her endorsement of frequent falls, in the absence of any substantial traumatic findings is reassuring. Patient is neurologically  intact, no evidence for intracranial pathology. Labs reassuring, no evidence for infection or electrolyte abnormalities. No evidence for coronary ischemia, nor arrhythmia on EKG.  Patient discharged in stable condition with return precautions. Final Clinical Impressions(s) / ED Diagnoses   Final diagnoses:  Fall, initial encounter     Carmin Muskrat, MD 09/26/17 1421

## 2017-09-26 NOTE — ED Notes (Signed)
ED Provider at bedside. 

## 2017-09-26 NOTE — ED Notes (Signed)
Pt verbalized understanding of discharge instructions and denies any further questions at this time.   

## 2017-09-26 NOTE — Discharge Instructions (Addendum)
As discussed, your evaluation today has been largely reassuring.  But, it is important that you monitor your condition carefully, and do not hesitate to return to the ED if you develop new, or concerning changes in your condition. ? ?Otherwise, please follow-up with your physician for appropriate ongoing care. ? ?

## 2017-09-26 NOTE — Telephone Encounter (Signed)
Spoke to [place] from patient engagement center. She stated that the patient fell this morning at 7:10 am due to cognitive and dizziness episodes. Arbie Cookey stated that the patient has fallen 5x over the course of 2 wks. The patient blood pressure is elevated ranging from 178-180/ 78-90. Arbie Cookey is trying to decide whether hospitalization or rehab will be fit for this situation.  Patient last OV was 06/2017 but patient is too weak to come into the office.

## 2017-09-26 NOTE — ED Notes (Signed)
[facility] called for report.

## 2017-09-26 NOTE — ED Triage Notes (Addendum)
EMS- 84 yo with c/o weakness, dizziness and falls x4 in the past week. Symptoms resolved post event. Denies chx pain, SOB or UTI. No blood thinners.  164/77 68 P 18 R 95% RA 138 CBG

## 2017-09-27 ENCOUNTER — Ambulatory Visit (INDEPENDENT_AMBULATORY_CARE_PROVIDER_SITE_OTHER): Payer: Medicare Other | Admitting: Adult Health

## 2017-09-27 ENCOUNTER — Encounter: Payer: Self-pay | Admitting: Adult Health

## 2017-09-27 ENCOUNTER — Telehealth: Payer: Self-pay

## 2017-09-27 VITALS — BP 98/40 | HR 70 | Temp 98.2°F

## 2017-09-27 DIAGNOSIS — R42 Dizziness and giddiness: Secondary | ICD-10-CM | POA: Diagnosis not present

## 2017-09-27 NOTE — Telephone Encounter (Signed)
Left a message for a return call.  Pt information not left at the voicemail was not identified.

## 2017-09-27 NOTE — Progress Notes (Signed)
Subjective:    Patient ID: Susan [name], female    DOB: [birth year], 84 y.o.   MRN: [id number]  HPI Pleasant 84 year old female patient of Dr. Raliegh Ip, that is seen for ED follow up 1 day ago for fall while after getting out of the shower.  She has had increased falling over the last year.  All of her falls have involved dizziness, she has not had any stumbling or tripping over objects.  She lives in assisted living and is independent.  Her medications are given by the nurse twice [place].  Her BP is checked Zobrist because of her falls.  These records are not provided nor are records with information specific to falls provided.  She has been treated for dehydration in the past and she still struggles to get an adequate amount of fluid intake.  Today she presents in a motorized wheelchair and is having a "wobbly" day.  Her gait and mobility waxes and wanes from day to day.  Some days her legs feel relatively strong and other days she feels weak.    Review of Systems  Respiratory: Negative for chest tightness and shortness of breath.   Cardiovascular: Negative.   Musculoskeletal: Positive for gait problem.  Neurological: Positive for dizziness, weakness and light-headedness. Negative for tremors.  Psychiatric/Behavioral: Negative for agitation, behavioral problems and confusion.   Past Medical History:  Diagnosis Date  . Aortic stenosis   . Atrial fibrillation, new onset ([place])   . CARCINOMA, BLADDER, TRANSITIONAL CELL 10/24/2007  . GERD (gastroesophageal reflux disease)   . HYPERTENSION 01/07/2007  . HYPOTHYROIDISM 01/07/2007  . IBS 05/03/2008  . MIXED HEARING LOSS BILATERAL 09/28/2009  . OSTEOARTHRITIS, KNEE 04/25/2007  . UNSPECIFIED ARTHROPATHY MULTIPLE SITES 10/26/2009    Social History   Socioeconomic History  . Marital status: Widowed    Spouse name: Not on file  . Number of children: 3  . Years of education: Not on file  . Highest education level: Not on file  Occupational History  .  Occupation: retired    Fish farm manager: RETIRED  Social Needs  . Financial resource strain: Not on file  . Food insecurity:    Worry: Not on file    Inability: Not on file  . Transportation needs:    Medical: Not on file    Non-medical: Not on file  Tobacco Use  . Smoking status: Former Smoker    Packs/day: 0.50    Years: 20.00    Pack years: 10.00    Types: Cigarettes    Last attempt to quit: 09/05/1973    Years since quitting: 44.0  . Smokeless tobacco: Never Used  Substance and Sexual Activity  . Alcohol use: Yes    Comment: ocass  . Drug use: No  . Sexual activity: Not on file  Lifestyle  . Physical activity:    Days per week: Not on file    Minutes per session: Not on file  . Stress: Not on file  Relationships  . Social connections:    Talks on phone: Not on file    Gets together: Not on file    Attends religious service: Not on file    Active member of club or organization: Not on file    Attends meetings of clubs or organizations: Not on file    Relationship status: Not on file  . Intimate partner violence:    Fear of current or ex partner: Not on file    Emotionally abused: Not on file  Physically abused: Not on file    Forced sexual activity: Not on file  Other Topics Concern  . Not on file  Social History Narrative  . Not on file    Past Surgical History:  Procedure Laterality Date  . BELPHAROPTOSIS REPAIR    . BLADDER TUMOR EXCISION    . BUNIONECTOMY     bilateral  . CATARACT EXTRACTION     bilateral  . TONSILLECTOMY AND ADENOIDECTOMY     x2    Family History  Problem Relation Age of Onset  . Stomach cancer Mother   . Arthritis Mother   . Leukemia Father   . Kidney failure Father   . Anuerysm Brother        brain  . ALS Sister   . Stomach cancer Maternal Aunt   . Colon cancer Daughter   . Esophageal cancer Neg Hx   . Rectal cancer Neg Hx     Allergies  Allergen Reactions  . Penicillins Diarrhea    Has patient had a PCN reaction  causing immediate rash, facial/tongue/throat swelling, SOB or lightheadedness with hypotension: YES Has patient had a PCN reaction causing severe rash involving mucus membranes or skin necrosis:NO Has patient had a PCN reaction that required hospitalization NO Has patient had a PCN reaction occurring within the last 10 years: NO If all of the above answers are "NO", then may proceed with Cephalosporin use.  . Ace Inhibitors     REACTION: cough  . Aspirin   . Hydrocodone     REACTION: Hallucinations---minor  . Nsaids     Current Outpatient Medications on File Prior to Visit  Medication Sig Dispense Refill  . acetaminophen (TYLENOL) 325 MG tablet Take 2 tablets (650 mg total) by mouth 3 (three) times Labreck.    Marland Kitchen aspirin 81 MG tablet Take 1 tablet (81 mg total) by mouth Dexheimer. 30 tablet   . atorvastatin (LIPITOR) 20 MG tablet TAKE 1 TABLET ONCE Gapinski. 90 tablet 0  . calcium carbonate (TUMS) 500 MG chewable tablet Chew 1 tablet by mouth as needed.      . carvedilol (COREG) 12.5 MG tablet TAKE  (1)  TABLET TWICE A DAY WITH MEALS (BREAKFAST AND SUPPER) 180 tablet 0  . famotidine (PEPCID) 20 MG tablet TAKE 1 TABLET TWICE Guyton. 60 tablet 0  . Glucosamine-Chondroitin 750-600 MG TABS Take 1 tablet by mouth 3 (three) times Llamas.    . hydrocortisone (ANUSOL-HC) 2.5 % rectal cream Place 1 application rectally 2 (two) times Gowell as needed.    Marland Kitchen levothyroxine (SYNTHROID, LEVOTHROID) 75 MCG tablet Take 0.5 tablets (37.5 mcg total) by mouth Picinich before breakfast. 30 tablet 0  . Loperamide HCl 2 MG CHEW 1 tablet every 6 hours as needed for diarrhea.  May take as a preventive measure prior to visits out of the facility 30 tablet 3  . Multiple Vitamin (MULTIVITAMIN) tablet Take 2 tablets by mouth Peaden.     . ondansetron (ZOFRAN) 8 MG tablet Take 1 tablet (8 mg total) by mouth every 8 (eight) hours as needed for nausea or vomiting. 20 tablet 0  . Psyllium 400 MG CAPS Take 400 mg by mouth 2 (two) times  Wery. WITH 4-6 OZ OF WATER     No current facility-administered medications on file prior to visit.     BP (!) 98/40 (BP Location: Right Arm)   Pulse 70   Temp 98.2 F (36.8 C) (Oral)   SpO2 96%   Resting BP recheck is  110/52    Objective:   Physical Exam  Constitutional: She is oriented to person, place, and time. She appears well-developed and well-nourished. No distress.  Cardiovascular: Normal rate, regular rhythm and intact distal pulses. Exam reveals no gallop and no friction rub.  Murmur heard. 4/6 heard throughout   Pulmonary/Chest: Effort normal and breath sounds normal. No respiratory distress. She has no wheezes. She has no rales.  Neurological: She is alert and oriented to person, place, and time.  Skin: Skin is warm and dry. She is not diaphoretic.  Psychiatric: She has a normal mood and affect. Her behavior is normal. Judgment and thought content normal.  Nursing note reviewed.     Assessment & Plan:

## 2017-09-27 NOTE — Patient Instructions (Signed)
I think your dizziness and falls in partially do to dehydration and too much blood pressure medication.   I am going to decrease your Coreg dose from 12.5 mg twice a day to 6.25 mg twice a day   Have your facility monitor your blood pressure twice a day   Follow up with your Dr. Raliegh Ip in two weeks   Please start drinking more water

## 2017-09-27 NOTE — Telephone Encounter (Signed)
Copied from [place] [phone]. Topic: General - Other >> Sep 27, 2017  5:07 PM Valla Leaver wrote: Reason for CRM: Clarene Critchley, w/ [facility] calling to clarify order from Sallee Provencal today, stating that the patient should reduce Coreg 12.5 to 6.25, but she's currently on 6.25 already, twice a day.

## 2017-09-27 NOTE — Progress Notes (Signed)
Subjective:    Patient ID: Susan [name], female    DOB: [birth year], 84 y.o.   MRN: [id number]  HPI  84 year old female who  has a past medical history of Aortic stenosis, Atrial fibrillation, new onset ([place]), CARCINOMA, BLADDER, TRANSITIONAL CELL (10/24/2007), GERD (gastroesophageal reflux disease), HYPERTENSION (01/07/2007), HYPOTHYROIDISM (01/07/2007), IBS (05/03/2008), MIXED HEARING LOSS BILATERAL (09/28/2009), OSTEOARTHRITIS, KNEE (04/25/2007), and UNSPECIFIED ARTHROPATHY MULTIPLE SITES (10/26/2009).  She is a patient of Dr. Raliegh Ip who I am seeing today for the issue of dizziness. She was seen in the ER yesterday s/p fall. Per ER note, she has frequent falls and yesterday had a similar fall. She noted feeling dizzy following the fall, and was seen by others to be weaker than usual. Patient reports that she was turning after showering, slipped, and fell onto her right arm.   She denied any head trauma or LOC.   Her dizziness resolved after several moments and the patient herself denies any focal or generalized weakness following the fall.   Labs, including CBC, BMP, and UA and EKG were reassuring.   BP in the ER was 143/76.   Today in the office her BP was 98/40 and 110/52.   Today in the office she reports that's he is doing better.Has not had any dizzy spells since being discharged but feels as today is a " wobbly day". She is in her motorized wheelchair. She has been treated for dehydration in the past and reports that she does not drink enough fluids throughout the day. She has noticed that a lot of her dizziness comes from when she changes positions.   Review of Systems  See HPI   Past Medical History:  Diagnosis Date  . Aortic stenosis   . Atrial fibrillation, new onset ([place])   . CARCINOMA, BLADDER, TRANSITIONAL CELL 10/24/2007  . GERD (gastroesophageal reflux disease)   . HYPERTENSION 01/07/2007  . HYPOTHYROIDISM 01/07/2007  . IBS 05/03/2008  . MIXED HEARING LOSS BILATERAL 09/28/2009    . OSTEOARTHRITIS, KNEE 04/25/2007  . UNSPECIFIED ARTHROPATHY MULTIPLE SITES 10/26/2009    Social History   Socioeconomic History  . Marital status: Widowed    Spouse name: Not on file  . Number of children: 3  . Years of education: Not on file  . Highest education level: Not on file  Occupational History  . Occupation: retired    Fish farm manager: RETIRED  Social Needs  . Financial resource strain: Not on file  . Food insecurity:    Worry: Not on file    Inability: Not on file  . Transportation needs:    Medical: Not on file    Non-medical: Not on file  Tobacco Use  . Smoking status: Former Smoker    Packs/day: 0.50    Years: 20.00    Pack years: 10.00    Types: Cigarettes    Last attempt to quit: 09/05/1973    Years since quitting: 44.0  . Smokeless tobacco: Never Used  Substance and Sexual Activity  . Alcohol use: Yes    Comment: ocass  . Drug use: No  . Sexual activity: Not on file  Lifestyle  . Physical activity:    Days per week: Not on file    Minutes per session: Not on file  . Stress: Not on file  Relationships  . Social connections:    Talks on phone: Not on file    Gets together: Not on file    Attends religious service: Not on file  Active member of club or organization: Not on file    Attends meetings of clubs or organizations: Not on file    Relationship status: Not on file  . Intimate partner violence:    Fear of current or ex partner: Not on file    Emotionally abused: Not on file    Physically abused: Not on file    Forced sexual activity: Not on file  Other Topics Concern  . Not on file  Social History Narrative  . Not on file    Past Surgical History:  Procedure Laterality Date  . BELPHAROPTOSIS REPAIR    . BLADDER TUMOR EXCISION    . BUNIONECTOMY     bilateral  . CATARACT EXTRACTION     bilateral  . TONSILLECTOMY AND ADENOIDECTOMY     x2    Family History  Problem Relation Age of Onset  . Stomach cancer Mother   . Arthritis  Mother   . Leukemia Father   . Kidney failure Father   . Anuerysm Brother        brain  . ALS Sister   . Stomach cancer Maternal Aunt   . Colon cancer Daughter   . Esophageal cancer Neg Hx   . Rectal cancer Neg Hx     Allergies  Allergen Reactions  . Penicillins Diarrhea    Has patient had a PCN reaction causing immediate rash, facial/tongue/throat swelling, SOB or lightheadedness with hypotension: YES Has patient had a PCN reaction causing severe rash involving mucus membranes or skin necrosis:NO Has patient had a PCN reaction that required hospitalization NO Has patient had a PCN reaction occurring within the last 10 years: NO If all of the above answers are "NO", then may proceed with Cephalosporin use.  . Ace Inhibitors     REACTION: cough  . Aspirin   . Hydrocodone     REACTION: Hallucinations---minor  . Nsaids     Current Outpatient Medications on File Prior to Visit  Medication Sig Dispense Refill  . acetaminophen (TYLENOL) 325 MG tablet Take 2 tablets (650 mg total) by mouth 3 (three) times Ayler.    Marland Kitchen aspirin 81 MG tablet Take 1 tablet (81 mg total) by mouth Cespedes. 30 tablet   . atorvastatin (LIPITOR) 20 MG tablet TAKE 1 TABLET ONCE Sirmons. 90 tablet 0  . calcium carbonate (TUMS) 500 MG chewable tablet Chew 1 tablet by mouth as needed.      . carvedilol (COREG) 12.5 MG tablet TAKE  (1)  TABLET TWICE A DAY WITH MEALS (BREAKFAST AND SUPPER) 180 tablet 0  . famotidine (PEPCID) 20 MG tablet TAKE 1 TABLET TWICE Koziol. 60 tablet 0  . Glucosamine-Chondroitin 750-600 MG TABS Take 1 tablet by mouth 3 (three) times Pappalardo.    . hydrocortisone (ANUSOL-HC) 2.5 % rectal cream Place 1 application rectally 2 (two) times Ulloa as needed.    Marland Kitchen levothyroxine (SYNTHROID, LEVOTHROID) 75 MCG tablet Take 0.5 tablets (37.5 mcg total) by mouth Scarano before breakfast. 30 tablet 0  . Loperamide HCl 2 MG CHEW 1 tablet every 6 hours as needed for diarrhea.  May take as a preventive measure prior to  visits out of the facility 30 tablet 3  . Multiple Vitamin (MULTIVITAMIN) tablet Take 2 tablets by mouth Mahoney.     . ondansetron (ZOFRAN) 8 MG tablet Take 1 tablet (8 mg total) by mouth every 8 (eight) hours as needed for nausea or vomiting. 20 tablet 0  . Psyllium 400 MG CAPS Take 400 mg  by mouth 2 (two) times Furney. WITH 4-6 OZ OF WATER     No current facility-administered medications on file prior to visit.     BP (!) 98/40 (BP Location: Right Arm)   Pulse 70   Temp 98.2 F (36.8 C) (Oral)   SpO2 96%        Objective:   Physical Exam  Constitutional: She is oriented to person, place, and time. She appears well-developed and well-nourished. No distress.  Eyes: Pupils are equal, round, and reactive to light. Conjunctivae and EOM are normal. Right eye exhibits no discharge. Left eye exhibits no discharge. No scleral icterus.  Cardiovascular: Normal rate and regular rhythm. Exam reveals no gallop and no friction rub.  Murmur heard. Pulmonary/Chest: Effort normal and breath sounds normal. No respiratory distress. She has no wheezes. She has no rales. She exhibits no tenderness.  Musculoskeletal:  In motorized wheelchair   Neurological: She is alert and oriented to person, place, and time.  Skin: Skin is warm and dry. No rash noted. She is not diaphoretic. No erythema. No pallor.  Psychiatric: She has a normal mood and affect. Her behavior is normal. Judgment and thought content normal.  Nursing note and vitals reviewed.     Assessment & Plan:  1. Dizziness - Dizziness and falls due to hypotension? Possibly related to dehydration or orthostatic hypotension or both. Will have her increase fluid consumption and will decrease Coreg from 12.5 mg to 6.25 mg BID. Follow up with PCP in 2 weeks  - carvedilol (COREG) 6.25 MG tablet; Take 1 tablet (6.25 mg total) by mouth 2 (two) times Kleve with a meal.  Dispense: 180 tablet; Refill: 1   Dorothyann Peng, NP

## 2017-09-30 MED ORDER — CARVEDILOL 6.25 MG PO TABS: 6.2500 mg | ORAL_TABLET | Freq: Two times a day (BID) | ORAL | 1 refills | Status: DC

## 2017-10-01 NOTE — Telephone Encounter (Signed)
Cory, should pt take 1 Coreg Acocella.  Or reduce strength?  Please advise.  Thanks!!

## 2017-10-01 NOTE — Telephone Encounter (Signed)
Our notes show Coreg 12.5 mg BID   Lets keep her on her current dose until she is seen by Dr. Raliegh Ip   Please make sure she is staying hydrated

## 2017-10-01 NOTE — Telephone Encounter (Signed)
Spoke to Susan [name] at [facility] and advised that medication stay at 6.25 BID until seen by Dr. Raliegh Ip on 10/15/17.  Advised pt should remain hydrated.  Will update medical record.

## 2017-10-03 DIAGNOSIS — M6281 Muscle weakness (generalized): Secondary | ICD-10-CM | POA: Diagnosis not present

## 2017-10-03 DIAGNOSIS — M179 Osteoarthritis of knee, unspecified: Secondary | ICD-10-CM | POA: Diagnosis not present

## 2017-10-03 DIAGNOSIS — R296 Repeated falls: Secondary | ICD-10-CM | POA: Diagnosis not present

## 2017-10-03 DIAGNOSIS — R2681 Unsteadiness on feet: Secondary | ICD-10-CM | POA: Diagnosis not present

## 2017-10-04 DIAGNOSIS — M6281 Muscle weakness (generalized): Secondary | ICD-10-CM | POA: Diagnosis not present

## 2017-10-04 DIAGNOSIS — R296 Repeated falls: Secondary | ICD-10-CM | POA: Diagnosis not present

## 2017-10-04 DIAGNOSIS — R2681 Unsteadiness on feet: Secondary | ICD-10-CM | POA: Diagnosis not present

## 2017-10-04 DIAGNOSIS — M179 Osteoarthritis of knee, unspecified: Secondary | ICD-10-CM | POA: Diagnosis not present

## 2017-10-07 DIAGNOSIS — R41840 Attention and concentration deficit: Secondary | ICD-10-CM | POA: Diagnosis not present

## 2017-10-07 DIAGNOSIS — G3184 Mild cognitive impairment, so stated: Secondary | ICD-10-CM | POA: Diagnosis not present

## 2017-10-07 DIAGNOSIS — M6389 Disorders of muscle in diseases classified elsewhere, multiple sites: Secondary | ICD-10-CM | POA: Diagnosis not present

## 2017-10-07 DIAGNOSIS — R42 Dizziness and giddiness: Secondary | ICD-10-CM | POA: Diagnosis not present

## 2017-10-07 DIAGNOSIS — R278 Other lack of coordination: Secondary | ICD-10-CM | POA: Diagnosis not present

## 2017-10-07 DIAGNOSIS — M6281 Muscle weakness (generalized): Secondary | ICD-10-CM | POA: Diagnosis not present

## 2017-10-07 DIAGNOSIS — R488 Other symbolic dysfunctions: Secondary | ICD-10-CM | POA: Diagnosis not present

## 2017-10-07 DIAGNOSIS — M179 Osteoarthritis of knee, unspecified: Secondary | ICD-10-CM | POA: Diagnosis not present

## 2017-10-07 DIAGNOSIS — R2681 Unsteadiness on feet: Secondary | ICD-10-CM | POA: Diagnosis not present

## 2017-10-07 DIAGNOSIS — R296 Repeated falls: Secondary | ICD-10-CM | POA: Diagnosis not present

## 2017-10-08 DIAGNOSIS — M6281 Muscle weakness (generalized): Secondary | ICD-10-CM | POA: Diagnosis not present

## 2017-10-08 DIAGNOSIS — R2681 Unsteadiness on feet: Secondary | ICD-10-CM | POA: Diagnosis not present

## 2017-10-08 DIAGNOSIS — R488 Other symbolic dysfunctions: Secondary | ICD-10-CM | POA: Diagnosis not present

## 2017-10-08 DIAGNOSIS — M6389 Disorders of muscle in diseases classified elsewhere, multiple sites: Secondary | ICD-10-CM | POA: Diagnosis not present

## 2017-10-08 DIAGNOSIS — R278 Other lack of coordination: Secondary | ICD-10-CM | POA: Diagnosis not present

## 2017-10-08 DIAGNOSIS — R41840 Attention and concentration deficit: Secondary | ICD-10-CM | POA: Diagnosis not present

## 2017-10-10 DIAGNOSIS — R488 Other symbolic dysfunctions: Secondary | ICD-10-CM | POA: Diagnosis not present

## 2017-10-10 DIAGNOSIS — M6281 Muscle weakness (generalized): Secondary | ICD-10-CM | POA: Diagnosis not present

## 2017-10-10 DIAGNOSIS — R2681 Unsteadiness on feet: Secondary | ICD-10-CM | POA: Diagnosis not present

## 2017-10-10 DIAGNOSIS — R41840 Attention and concentration deficit: Secondary | ICD-10-CM | POA: Diagnosis not present

## 2017-10-10 DIAGNOSIS — M6389 Disorders of muscle in diseases classified elsewhere, multiple sites: Secondary | ICD-10-CM | POA: Diagnosis not present

## 2017-10-10 DIAGNOSIS — R278 Other lack of coordination: Secondary | ICD-10-CM | POA: Diagnosis not present

## 2017-10-11 DIAGNOSIS — M6389 Disorders of muscle in diseases classified elsewhere, multiple sites: Secondary | ICD-10-CM | POA: Diagnosis not present

## 2017-10-11 DIAGNOSIS — R2681 Unsteadiness on feet: Secondary | ICD-10-CM | POA: Diagnosis not present

## 2017-10-11 DIAGNOSIS — R488 Other symbolic dysfunctions: Secondary | ICD-10-CM | POA: Diagnosis not present

## 2017-10-11 DIAGNOSIS — M6281 Muscle weakness (generalized): Secondary | ICD-10-CM | POA: Diagnosis not present

## 2017-10-11 DIAGNOSIS — R41840 Attention and concentration deficit: Secondary | ICD-10-CM | POA: Diagnosis not present

## 2017-10-11 DIAGNOSIS — R278 Other lack of coordination: Secondary | ICD-10-CM | POA: Diagnosis not present

## 2017-10-14 ENCOUNTER — Ambulatory Visit: Payer: Medicare Other | Admitting: Internal Medicine

## 2017-10-14 DIAGNOSIS — R488 Other symbolic dysfunctions: Secondary | ICD-10-CM | POA: Diagnosis not present

## 2017-10-14 DIAGNOSIS — R41840 Attention and concentration deficit: Secondary | ICD-10-CM | POA: Diagnosis not present

## 2017-10-14 DIAGNOSIS — M6281 Muscle weakness (generalized): Secondary | ICD-10-CM | POA: Diagnosis not present

## 2017-10-14 DIAGNOSIS — M6389 Disorders of muscle in diseases classified elsewhere, multiple sites: Secondary | ICD-10-CM | POA: Diagnosis not present

## 2017-10-14 DIAGNOSIS — R278 Other lack of coordination: Secondary | ICD-10-CM | POA: Diagnosis not present

## 2017-10-14 DIAGNOSIS — R2681 Unsteadiness on feet: Secondary | ICD-10-CM | POA: Diagnosis not present

## 2017-10-15 ENCOUNTER — Encounter: Payer: Self-pay | Admitting: Internal Medicine

## 2017-10-15 ENCOUNTER — Ambulatory Visit (INDEPENDENT_AMBULATORY_CARE_PROVIDER_SITE_OTHER): Payer: Medicare Other | Admitting: Internal Medicine

## 2017-10-15 VITALS — BP 130/80 | HR 84 | Temp 98.4°F | Wt 152.0 lb

## 2017-10-15 DIAGNOSIS — R488 Other symbolic dysfunctions: Secondary | ICD-10-CM | POA: Diagnosis not present

## 2017-10-15 DIAGNOSIS — R278 Other lack of coordination: Secondary | ICD-10-CM | POA: Diagnosis not present

## 2017-10-15 DIAGNOSIS — M6389 Disorders of muscle in diseases classified elsewhere, multiple sites: Secondary | ICD-10-CM | POA: Diagnosis not present

## 2017-10-15 DIAGNOSIS — R2681 Unsteadiness on feet: Secondary | ICD-10-CM | POA: Diagnosis not present

## 2017-10-15 DIAGNOSIS — R41840 Attention and concentration deficit: Secondary | ICD-10-CM | POA: Diagnosis not present

## 2017-10-15 DIAGNOSIS — I350 Nonrheumatic aortic (valve) stenosis: Secondary | ICD-10-CM | POA: Diagnosis not present

## 2017-10-15 DIAGNOSIS — R2689 Other abnormalities of gait and mobility: Secondary | ICD-10-CM

## 2017-10-15 DIAGNOSIS — M6281 Muscle weakness (generalized): Secondary | ICD-10-CM | POA: Diagnosis not present

## 2017-10-15 DIAGNOSIS — I10 Essential (primary) hypertension: Secondary | ICD-10-CM

## 2017-10-15 NOTE — Patient Instructions (Signed)
Continue present medical regimen  Report any new or worsening symptoms  Return in 3 months for follow-up

## 2017-10-15 NOTE — Progress Notes (Signed)
   Subjective:    Patient ID: Susan [name], female    DOB: Jun 13, 1919, 84 y.o.   MRN: [id number]  HPI    Review of Systems     Objective:   Physical Exam        Assessment & Plan:

## 2017-10-15 NOTE — Progress Notes (Signed)
Subjective:    Patient ID: Susan [name], female    DOB: 05-Nov-1918, 84 y.o.   MRN: [id number]  HPI 84 year old patient has a history of essential hypertension as well as aortic stenosis.  She has a long history of unsteady gait and uses a motorized wheelchair.  She resides at [facility] facility where she has been monitored closely due to frequent falls.  She was seen in the ED on March 21 after becoming dizzy and falling while in the shower. At times she describes a sense of spinning in her body moving. She has had a number of blood pressure readings in a low normal range and carvedilol has been decreased to 6.25 mg twice [place] Today she feels well.  Laboratory studies from the ED from 09/26/2017 reviewed  Past Medical History:  Diagnosis Date  . Aortic stenosis   . Atrial fibrillation, new onset ([place])   . CARCINOMA, BLADDER, TRANSITIONAL CELL 10/24/2007  . GERD (gastroesophageal reflux disease)   . HYPERTENSION 01/07/2007  . HYPOTHYROIDISM 01/07/2007  . IBS 05/03/2008  . MIXED HEARING LOSS BILATERAL 09/28/2009  . OSTEOARTHRITIS, KNEE 04/25/2007  . UNSPECIFIED ARTHROPATHY MULTIPLE SITES 10/26/2009     Social History   Socioeconomic History  . Marital status: Widowed    Spouse name: Not on file  . Number of children: 3  . Years of education: Not on file  . Highest education level: Not on file  Occupational History  . Occupation: retired    Fish farm manager: RETIRED  Social Needs  . Financial resource strain: Not on file  . Food insecurity:    Worry: Not on file    Inability: Not on file  . Transportation needs:    Medical: Not on file    Non-medical: Not on file  Tobacco Use  . Smoking status: Former Smoker    Packs/day: 0.50    Years: 20.00    Pack years: 10.00    Types: Cigarettes    Last attempt to quit: 09/05/1973    Years since quitting: 44.1  . Smokeless tobacco: Never Used  Substance and Sexual Activity  . Alcohol use: Yes    Comment: ocass  . Drug  use: No  . Sexual activity: Not on file  Lifestyle  . Physical activity:    Days per week: Not on file    Minutes per session: Not on file  . Stress: Not on file  Relationships  . Social connections:    Talks on phone: Not on file    Gets together: Not on file    Attends religious service: Not on file    Active member of club or organization: Not on file    Attends meetings of clubs or organizations: Not on file    Relationship status: Not on file  . Intimate partner violence:    Fear of current or ex partner: Not on file    Emotionally abused: Not on file    Physically abused: Not on file    Forced sexual activity: Not on file  Other Topics Concern  . Not on file  Social History Narrative  . Not on file    Past Surgical History:  Procedure Laterality Date  . BELPHAROPTOSIS REPAIR    . BLADDER TUMOR EXCISION    . BUNIONECTOMY     bilateral  . CATARACT EXTRACTION     bilateral  . TONSILLECTOMY AND ADENOIDECTOMY     x2    Family History  Problem Relation Age of Onset  .  Stomach cancer Mother   . Arthritis Mother   . Leukemia Father   . Kidney failure Father   . Anuerysm Brother        brain  . ALS Sister   . Stomach cancer Maternal Aunt   . Colon cancer Daughter   . Esophageal cancer Neg Hx   . Rectal cancer Neg Hx     Allergies  Allergen Reactions  . Penicillins Diarrhea    Has patient had a PCN reaction causing immediate rash, facial/tongue/throat swelling, SOB or lightheadedness with hypotension: YES Has patient had a PCN reaction causing severe rash involving mucus membranes or skin necrosis:NO Has patient had a PCN reaction that required hospitalization NO Has patient had a PCN reaction occurring within the last 10 years: NO If all of the above answers are "NO", then may proceed with Cephalosporin use.  . Ace Inhibitors     REACTION: cough  . Aspirin   . Hydrocodone     REACTION: Hallucinations---minor  . Nsaids     Current Outpatient  Medications on File Prior to Visit  Medication Sig Dispense Refill  . acetaminophen (TYLENOL) 325 MG tablet Take 2 tablets (650 mg total) by mouth 3 (three) times Morones.    Marland Kitchen aspirin 81 MG tablet Take 1 tablet (81 mg total) by mouth Dark. 30 tablet   . atorvastatin (LIPITOR) 20 MG tablet TAKE 1 TABLET ONCE Maravilla. 90 tablet 0  . calcium carbonate (TUMS) 500 MG chewable tablet Chew 1 tablet by mouth as needed.      . carvedilol (COREG) 6.25 MG tablet Take 1 tablet (6.25 mg total) by mouth 2 (two) times Grether with a meal. 180 tablet 1  . famotidine (PEPCID) 20 MG tablet TAKE 1 TABLET TWICE Tarbell. 60 tablet 0  . Glucosamine-Chondroitin 750-600 MG TABS Take 1 tablet by mouth 3 (three) times Westry.    . hydrocortisone (ANUSOL-HC) 2.5 % rectal cream Place 1 application rectally 2 (two) times Garlitz as needed.    Marland Kitchen levothyroxine (SYNTHROID, LEVOTHROID) 75 MCG tablet Take 0.5 tablets (37.5 mcg total) by mouth Kitagawa before breakfast. 30 tablet 0  . Loperamide HCl 2 MG CHEW 1 tablet every 6 hours as needed for diarrhea.  May take as a preventive measure prior to visits out of the facility 30 tablet 3  . Multiple Vitamin (MULTIVITAMIN) tablet Take 2 tablets by mouth Rhymes.     . ondansetron (ZOFRAN) 8 MG tablet Take 1 tablet (8 mg total) by mouth every 8 (eight) hours as needed for nausea or vomiting. 20 tablet 0  . Psyllium 400 MG CAPS Take 400 mg by mouth 2 (two) times Hoselton. WITH 4-6 OZ OF WATER     No current facility-administered medications on file prior to visit.     BP 130/80 (BP Location: Right Arm, Patient Position: Sitting, Cuff Size: Large)   Pulse 84   Temp 98.4 F (36.9 C) (Oral)   Wt 152 lb (68.9 kg)   SpO2 97%   BMI 26.09 kg/m      Review of Systems  Musculoskeletal: Positive for gait problem.  Neurological: Positive for dizziness, weakness and light-headedness.       Objective:   Physical Exam  Constitutional: She is oriented to person, place, and time. She appears  well-developed and well-nourished. No distress.  Alert and appropriate but quite hard of hearing Blood pressure 140/82  Sitting in a motorized wheelchair  HENT:  Head: Normocephalic.  Right Ear: External ear normal.  Left Ear:  External ear normal.  Mouth/Throat: Oropharynx is clear and moist.  Eyes: Pupils are equal, round, and reactive to light. Conjunctivae and EOM are normal.  Neck: Normal range of motion. Neck supple. No thyromegaly present.  Cardiovascular: Normal rate, regular rhythm and intact distal pulses.  Murmur heard. Grade 5-0/0 harsh systolic murmur  Pulmonary/Chest: Effort normal and breath sounds normal.  Abdominal: Soft. Bowel sounds are normal. She exhibits no mass. There is no tenderness.  Musculoskeletal: Normal range of motion.  Lymphadenopathy:    She has no cervical adenopathy.  Neurological: She is alert and oriented to person, place, and time.  Skin: Skin is warm and dry. No rash noted.  Psychiatric: She has a normal mood and affect. Her behavior is normal.          Assessment & Plan:   Gait abnormality.  Multifactorial.  At times some of her symptoms seem vertiginous, and other times sounds more like nonspecific dizziness possibly aggravated by orthostatic hypotension.  Carvedilol has been down titrated.  Will continue on present regimen.  Patient is receiving active physical therapy at her assisted living facility Aortic stenosis History of hypertension  Cardiology follow-up in 2 days as scheduled No change in medical regimen  Nyoka Cowden

## 2017-10-15 NOTE — Progress Notes (Signed)
HPI The patient presents for follow up of AS and PAF.  She returns for follow up.  Since I saw her she had a fall but no syncope.  She, from a cardiac standpoint has done well.  She does not have palpitations.  She does not have chest pressure, neck or arm discomfort.  She is had no weight gain or edema.  She gets around in her motorized wheelchair most of the time although she does ambulate with a walker in the hallway.    Allergies  Allergen Reactions  . Penicillins Diarrhea    Has patient had a PCN reaction causing immediate rash, facial/tongue/throat swelling, SOB or lightheadedness with hypotension: YES Has patient had a PCN reaction causing severe rash involving mucus membranes or skin necrosis:NO Has patient had a PCN reaction that required hospitalization NO Has patient had a PCN reaction occurring within the last 10 years: NO If all of the above answers are "NO", then may proceed with Cephalosporin use.  . Ace Inhibitors     REACTION: cough  . Aspirin   . Hydrocodone     REACTION: Hallucinations---minor  . Nsaids     Current Outpatient Medications  Medication Sig Dispense Refill  . acetaminophen (TYLENOL) 325 MG tablet Take 2 tablets (650 mg total) by mouth 3 (three) times Mroczkowski.    Marland Kitchen atorvastatin (LIPITOR) 20 MG tablet TAKE 1 TABLET ONCE Riebel. 90 tablet 0  . calcium carbonate (TUMS) 500 MG chewable tablet Chew 1 tablet by mouth as needed.      . carvedilol (COREG) 12.5 MG tablet Take 6.25 mg by mouth 2 (two) times Rail.    . famotidine (PEPCID) 20 MG tablet TAKE 1 TABLET TWICE Manzella. 60 tablet 0  . Glucosamine-Chondroitin 750-600 MG TABS Take 1 tablet by mouth 3 (three) times Parfitt.    . hydrocortisone (ANUSOL-HC) 2.5 % rectal cream Place 1 application rectally 2 (two) times Foronda as needed.    Marland Kitchen levothyroxine (SYNTHROID, LEVOTHROID) 75 MCG tablet Take 0.5 tablets (37.5 mcg total) by mouth Gover before breakfast. 30 tablet 0  . Loperamide HCl 2 MG CHEW 1 tablet every  6 hours as needed for diarrhea.  May take as a preventive measure prior to visits out of the facility 30 tablet 3  . Multiple Vitamin (MULTIVITAMIN) tablet Take 2 tablets by mouth Cressy.     . ondansetron (ZOFRAN) 8 MG tablet Take 1 tablet (8 mg total) by mouth every 8 (eight) hours as needed for nausea or vomiting. 20 tablet 0  . Psyllium 400 MG CAPS Take 400 mg by mouth 2 (two) times Rucci. WITH 4-6 OZ OF WATER     No current facility-administered medications for this visit.     Past Medical History:  Diagnosis Date  . Aortic stenosis   . Atrial fibrillation, new onset ([place])   . CARCINOMA, BLADDER, TRANSITIONAL CELL 10/24/2007  . GERD (gastroesophageal reflux disease)   . HYPERTENSION 01/07/2007  . HYPOTHYROIDISM 01/07/2007  . IBS 05/03/2008  . MIXED HEARING LOSS BILATERAL 09/28/2009  . OSTEOARTHRITIS, KNEE 04/25/2007  . UNSPECIFIED ARTHROPATHY MULTIPLE SITES 10/26/2009    Past Surgical History:  Procedure Laterality Date  . BELPHAROPTOSIS REPAIR    . BLADDER TUMOR EXCISION    . BUNIONECTOMY     bilateral  . CATARACT EXTRACTION     bilateral  . TONSILLECTOMY AND ADENOIDECTOMY     x2    ROS:   As stated in the HPI and negative for all other systems.  3 out of 6   PHYSICAL EXAM BP (!) 147/80   Pulse 78   Ht 5\' 4"  (1.626 m)   Wt 152 lb 9.6 oz (69.2 kg)   BMI 26.19 kg/m   GENERAL:  Well appearing for her age though slightly frail NECK:  No jugular venous distention at 90 degrees, waveform within normal limits, carotid upstroke brisk and symmetric, no bruits, no thyromegaly LUNGS:  Clear to auscultation bilaterally CHEST:  Unremarkable HEART:  PMI not displaced or sustained,S1 and S2 within normal limits, no S3, no S4, no clicks, no rubs, apical systolic murmur mid peaking and radiating out the aortic outflow tract, no diastolic murmurs ABD:  Flat, positive bowel sounds normal in frequency in pitch, no bruits, no rebound, no guarding, no midline pulsatile mass, exam is  compromised because she is seated EXT:  2 plus pulses throughout, trace edema, no cyanosis no clubbing   EKG:    ASSESSMENT AND PLAN   Aortic stenosis - This was moderate on echo in 2017.  She is not having any symptoms related to this.  She uses a Human resources officer and I cautioned about this.  No change in therapy.  Slightly elevated.    (Of note I did review the emergency room records.  There was a question whether her beta-blocker dose was reduced but I do not see that.)   HTN - The blood pressure is slightly elevated but I would not suggest any change to her medicines based on this.     PAF - Ms. Lakevia Perris Salinger has a CHA2DS2 - VASc score of 4 with a risk of stroke of 4%.  She continues to have falls.  We are avoiding anticoagulation.

## 2017-10-17 ENCOUNTER — Ambulatory Visit (INDEPENDENT_AMBULATORY_CARE_PROVIDER_SITE_OTHER): Payer: Medicare Other | Admitting: Cardiology

## 2017-10-17 ENCOUNTER — Encounter: Payer: Self-pay | Admitting: Cardiology

## 2017-10-17 VITALS — BP 147/80 | HR 78 | Ht 64.0 in | Wt 152.6 lb

## 2017-10-17 DIAGNOSIS — R2681 Unsteadiness on feet: Secondary | ICD-10-CM | POA: Diagnosis not present

## 2017-10-17 DIAGNOSIS — I480 Paroxysmal atrial fibrillation: Secondary | ICD-10-CM

## 2017-10-17 DIAGNOSIS — R278 Other lack of coordination: Secondary | ICD-10-CM | POA: Diagnosis not present

## 2017-10-17 DIAGNOSIS — I350 Nonrheumatic aortic (valve) stenosis: Secondary | ICD-10-CM | POA: Diagnosis not present

## 2017-10-17 DIAGNOSIS — I10 Essential (primary) hypertension: Secondary | ICD-10-CM

## 2017-10-17 DIAGNOSIS — R41840 Attention and concentration deficit: Secondary | ICD-10-CM | POA: Diagnosis not present

## 2017-10-17 DIAGNOSIS — M6389 Disorders of muscle in diseases classified elsewhere, multiple sites: Secondary | ICD-10-CM | POA: Diagnosis not present

## 2017-10-17 DIAGNOSIS — R488 Other symbolic dysfunctions: Secondary | ICD-10-CM | POA: Diagnosis not present

## 2017-10-17 DIAGNOSIS — M6281 Muscle weakness (generalized): Secondary | ICD-10-CM | POA: Diagnosis not present

## 2017-10-17 NOTE — Patient Instructions (Signed)

## 2017-10-21 DIAGNOSIS — R278 Other lack of coordination: Secondary | ICD-10-CM | POA: Diagnosis not present

## 2017-10-21 DIAGNOSIS — M6281 Muscle weakness (generalized): Secondary | ICD-10-CM | POA: Diagnosis not present

## 2017-10-21 DIAGNOSIS — R41840 Attention and concentration deficit: Secondary | ICD-10-CM | POA: Diagnosis not present

## 2017-10-21 DIAGNOSIS — R488 Other symbolic dysfunctions: Secondary | ICD-10-CM | POA: Diagnosis not present

## 2017-10-21 DIAGNOSIS — M6389 Disorders of muscle in diseases classified elsewhere, multiple sites: Secondary | ICD-10-CM | POA: Diagnosis not present

## 2017-10-21 DIAGNOSIS — R2681 Unsteadiness on feet: Secondary | ICD-10-CM | POA: Diagnosis not present

## 2017-10-22 ENCOUNTER — Telehealth: Payer: Self-pay | Admitting: Family Medicine

## 2017-10-22 NOTE — Telephone Encounter (Signed)
Copied from [place] [id number]. Topic: General - Other >> Oct 22, 2017 11:06 AM Carolyn Stare wrote:  Naiomi with Therapy at [facility] faxed over a req for recent office notes and is asking if the fax was received , would like a call back  [phone]

## 2017-10-23 DIAGNOSIS — M6389 Disorders of muscle in diseases classified elsewhere, multiple sites: Secondary | ICD-10-CM | POA: Diagnosis not present

## 2017-10-23 DIAGNOSIS — M6281 Muscle weakness (generalized): Secondary | ICD-10-CM | POA: Diagnosis not present

## 2017-10-23 DIAGNOSIS — R41840 Attention and concentration deficit: Secondary | ICD-10-CM | POA: Diagnosis not present

## 2017-10-23 DIAGNOSIS — R2681 Unsteadiness on feet: Secondary | ICD-10-CM | POA: Diagnosis not present

## 2017-10-23 DIAGNOSIS — R278 Other lack of coordination: Secondary | ICD-10-CM | POA: Diagnosis not present

## 2017-10-23 DIAGNOSIS — R488 Other symbolic dysfunctions: Secondary | ICD-10-CM | POA: Diagnosis not present

## 2017-10-23 NOTE — Telephone Encounter (Signed)
Left message stating that fax was received.

## 2017-10-25 DIAGNOSIS — R41840 Attention and concentration deficit: Secondary | ICD-10-CM | POA: Diagnosis not present

## 2017-10-25 DIAGNOSIS — R278 Other lack of coordination: Secondary | ICD-10-CM | POA: Diagnosis not present

## 2017-10-25 DIAGNOSIS — R2681 Unsteadiness on feet: Secondary | ICD-10-CM | POA: Diagnosis not present

## 2017-10-25 DIAGNOSIS — M6389 Disorders of muscle in diseases classified elsewhere, multiple sites: Secondary | ICD-10-CM | POA: Diagnosis not present

## 2017-10-25 DIAGNOSIS — M6281 Muscle weakness (generalized): Secondary | ICD-10-CM | POA: Diagnosis not present

## 2017-10-25 DIAGNOSIS — R488 Other symbolic dysfunctions: Secondary | ICD-10-CM | POA: Diagnosis not present

## 2017-10-28 DIAGNOSIS — M6389 Disorders of muscle in diseases classified elsewhere, multiple sites: Secondary | ICD-10-CM | POA: Diagnosis not present

## 2017-10-28 DIAGNOSIS — R2681 Unsteadiness on feet: Secondary | ICD-10-CM | POA: Diagnosis not present

## 2017-10-28 DIAGNOSIS — R488 Other symbolic dysfunctions: Secondary | ICD-10-CM | POA: Diagnosis not present

## 2017-10-28 DIAGNOSIS — M6281 Muscle weakness (generalized): Secondary | ICD-10-CM | POA: Diagnosis not present

## 2017-10-28 DIAGNOSIS — R41840 Attention and concentration deficit: Secondary | ICD-10-CM | POA: Diagnosis not present

## 2017-10-28 DIAGNOSIS — R278 Other lack of coordination: Secondary | ICD-10-CM | POA: Diagnosis not present

## 2017-10-29 DIAGNOSIS — R278 Other lack of coordination: Secondary | ICD-10-CM | POA: Diagnosis not present

## 2017-10-29 DIAGNOSIS — R488 Other symbolic dysfunctions: Secondary | ICD-10-CM | POA: Diagnosis not present

## 2017-10-29 DIAGNOSIS — M6281 Muscle weakness (generalized): Secondary | ICD-10-CM | POA: Diagnosis not present

## 2017-10-29 DIAGNOSIS — R41840 Attention and concentration deficit: Secondary | ICD-10-CM | POA: Diagnosis not present

## 2017-10-29 DIAGNOSIS — M6389 Disorders of muscle in diseases classified elsewhere, multiple sites: Secondary | ICD-10-CM | POA: Diagnosis not present

## 2017-10-29 DIAGNOSIS — R2681 Unsteadiness on feet: Secondary | ICD-10-CM | POA: Diagnosis not present

## 2017-10-31 DIAGNOSIS — M6281 Muscle weakness (generalized): Secondary | ICD-10-CM | POA: Diagnosis not present

## 2017-10-31 DIAGNOSIS — R488 Other symbolic dysfunctions: Secondary | ICD-10-CM | POA: Diagnosis not present

## 2017-10-31 DIAGNOSIS — R41840 Attention and concentration deficit: Secondary | ICD-10-CM | POA: Diagnosis not present

## 2017-10-31 DIAGNOSIS — M6389 Disorders of muscle in diseases classified elsewhere, multiple sites: Secondary | ICD-10-CM | POA: Diagnosis not present

## 2017-10-31 DIAGNOSIS — R2681 Unsteadiness on feet: Secondary | ICD-10-CM | POA: Diagnosis not present

## 2017-10-31 DIAGNOSIS — R278 Other lack of coordination: Secondary | ICD-10-CM | POA: Diagnosis not present

## 2017-11-04 DIAGNOSIS — R2681 Unsteadiness on feet: Secondary | ICD-10-CM | POA: Diagnosis not present

## 2017-11-04 DIAGNOSIS — R41840 Attention and concentration deficit: Secondary | ICD-10-CM | POA: Diagnosis not present

## 2017-11-04 DIAGNOSIS — R278 Other lack of coordination: Secondary | ICD-10-CM | POA: Diagnosis not present

## 2017-11-04 DIAGNOSIS — M6389 Disorders of muscle in diseases classified elsewhere, multiple sites: Secondary | ICD-10-CM | POA: Diagnosis not present

## 2017-11-04 DIAGNOSIS — R488 Other symbolic dysfunctions: Secondary | ICD-10-CM | POA: Diagnosis not present

## 2017-11-04 DIAGNOSIS — M6281 Muscle weakness (generalized): Secondary | ICD-10-CM | POA: Diagnosis not present

## 2017-11-05 DIAGNOSIS — R488 Other symbolic dysfunctions: Secondary | ICD-10-CM | POA: Diagnosis not present

## 2017-11-05 DIAGNOSIS — R41840 Attention and concentration deficit: Secondary | ICD-10-CM | POA: Diagnosis not present

## 2017-11-05 DIAGNOSIS — M6389 Disorders of muscle in diseases classified elsewhere, multiple sites: Secondary | ICD-10-CM | POA: Diagnosis not present

## 2017-11-05 DIAGNOSIS — M6281 Muscle weakness (generalized): Secondary | ICD-10-CM | POA: Diagnosis not present

## 2017-11-05 DIAGNOSIS — R278 Other lack of coordination: Secondary | ICD-10-CM | POA: Diagnosis not present

## 2017-11-05 DIAGNOSIS — R2681 Unsteadiness on feet: Secondary | ICD-10-CM | POA: Diagnosis not present

## 2017-11-07 DIAGNOSIS — R296 Repeated falls: Secondary | ICD-10-CM | POA: Diagnosis not present

## 2017-11-07 DIAGNOSIS — R41841 Cognitive communication deficit: Secondary | ICD-10-CM | POA: Diagnosis not present

## 2017-11-07 DIAGNOSIS — G301 Alzheimer's disease with late onset: Secondary | ICD-10-CM | POA: Diagnosis not present

## 2017-11-07 DIAGNOSIS — M6281 Muscle weakness (generalized): Secondary | ICD-10-CM | POA: Diagnosis not present

## 2017-11-07 DIAGNOSIS — M179 Osteoarthritis of knee, unspecified: Secondary | ICD-10-CM | POA: Diagnosis not present

## 2017-11-07 DIAGNOSIS — R2681 Unsteadiness on feet: Secondary | ICD-10-CM | POA: Diagnosis not present

## 2017-11-08 DIAGNOSIS — R41841 Cognitive communication deficit: Secondary | ICD-10-CM | POA: Diagnosis not present

## 2017-11-08 DIAGNOSIS — R296 Repeated falls: Secondary | ICD-10-CM | POA: Diagnosis not present

## 2017-11-08 DIAGNOSIS — M179 Osteoarthritis of knee, unspecified: Secondary | ICD-10-CM | POA: Diagnosis not present

## 2017-11-08 DIAGNOSIS — M6281 Muscle weakness (generalized): Secondary | ICD-10-CM | POA: Diagnosis not present

## 2017-11-08 DIAGNOSIS — R2681 Unsteadiness on feet: Secondary | ICD-10-CM | POA: Diagnosis not present

## 2017-11-08 DIAGNOSIS — G301 Alzheimer's disease with late onset: Secondary | ICD-10-CM | POA: Diagnosis not present

## 2017-11-11 DIAGNOSIS — R296 Repeated falls: Secondary | ICD-10-CM | POA: Diagnosis not present

## 2017-11-11 DIAGNOSIS — M179 Osteoarthritis of knee, unspecified: Secondary | ICD-10-CM | POA: Diagnosis not present

## 2017-11-11 DIAGNOSIS — R2681 Unsteadiness on feet: Secondary | ICD-10-CM | POA: Diagnosis not present

## 2017-11-11 DIAGNOSIS — R41841 Cognitive communication deficit: Secondary | ICD-10-CM | POA: Diagnosis not present

## 2017-11-11 DIAGNOSIS — M6281 Muscle weakness (generalized): Secondary | ICD-10-CM | POA: Diagnosis not present

## 2017-11-11 DIAGNOSIS — G301 Alzheimer's disease with late onset: Secondary | ICD-10-CM | POA: Diagnosis not present

## 2017-11-12 DIAGNOSIS — M6281 Muscle weakness (generalized): Secondary | ICD-10-CM | POA: Diagnosis not present

## 2017-11-12 DIAGNOSIS — R296 Repeated falls: Secondary | ICD-10-CM | POA: Diagnosis not present

## 2017-11-12 DIAGNOSIS — R2681 Unsteadiness on feet: Secondary | ICD-10-CM | POA: Diagnosis not present

## 2017-11-12 DIAGNOSIS — M179 Osteoarthritis of knee, unspecified: Secondary | ICD-10-CM | POA: Diagnosis not present

## 2017-11-12 DIAGNOSIS — R41841 Cognitive communication deficit: Secondary | ICD-10-CM | POA: Diagnosis not present

## 2017-11-12 DIAGNOSIS — G301 Alzheimer's disease with late onset: Secondary | ICD-10-CM | POA: Diagnosis not present

## 2017-11-14 DIAGNOSIS — R41841 Cognitive communication deficit: Secondary | ICD-10-CM | POA: Diagnosis not present

## 2017-11-14 DIAGNOSIS — M179 Osteoarthritis of knee, unspecified: Secondary | ICD-10-CM | POA: Diagnosis not present

## 2017-11-14 DIAGNOSIS — M6281 Muscle weakness (generalized): Secondary | ICD-10-CM | POA: Diagnosis not present

## 2017-11-14 DIAGNOSIS — G301 Alzheimer's disease with late onset: Secondary | ICD-10-CM | POA: Diagnosis not present

## 2017-11-14 DIAGNOSIS — R296 Repeated falls: Secondary | ICD-10-CM | POA: Diagnosis not present

## 2017-11-14 DIAGNOSIS — R2681 Unsteadiness on feet: Secondary | ICD-10-CM | POA: Diagnosis not present

## 2017-11-18 DIAGNOSIS — R296 Repeated falls: Secondary | ICD-10-CM | POA: Diagnosis not present

## 2017-11-18 DIAGNOSIS — M6281 Muscle weakness (generalized): Secondary | ICD-10-CM | POA: Diagnosis not present

## 2017-11-18 DIAGNOSIS — G301 Alzheimer's disease with late onset: Secondary | ICD-10-CM | POA: Diagnosis not present

## 2017-11-18 DIAGNOSIS — R2681 Unsteadiness on feet: Secondary | ICD-10-CM | POA: Diagnosis not present

## 2017-11-18 DIAGNOSIS — R41841 Cognitive communication deficit: Secondary | ICD-10-CM | POA: Diagnosis not present

## 2017-11-18 DIAGNOSIS — M179 Osteoarthritis of knee, unspecified: Secondary | ICD-10-CM | POA: Diagnosis not present

## 2017-11-25 IMAGING — DX DG CHEST 1V PORT
1 series · 1 of 1 positions shown · non-contrast
Comparison: None.

CLINICAL DATA: Chest pain and palpitations

EXAM:
PORTABLE CHEST 1 VIEW

[chest ap]
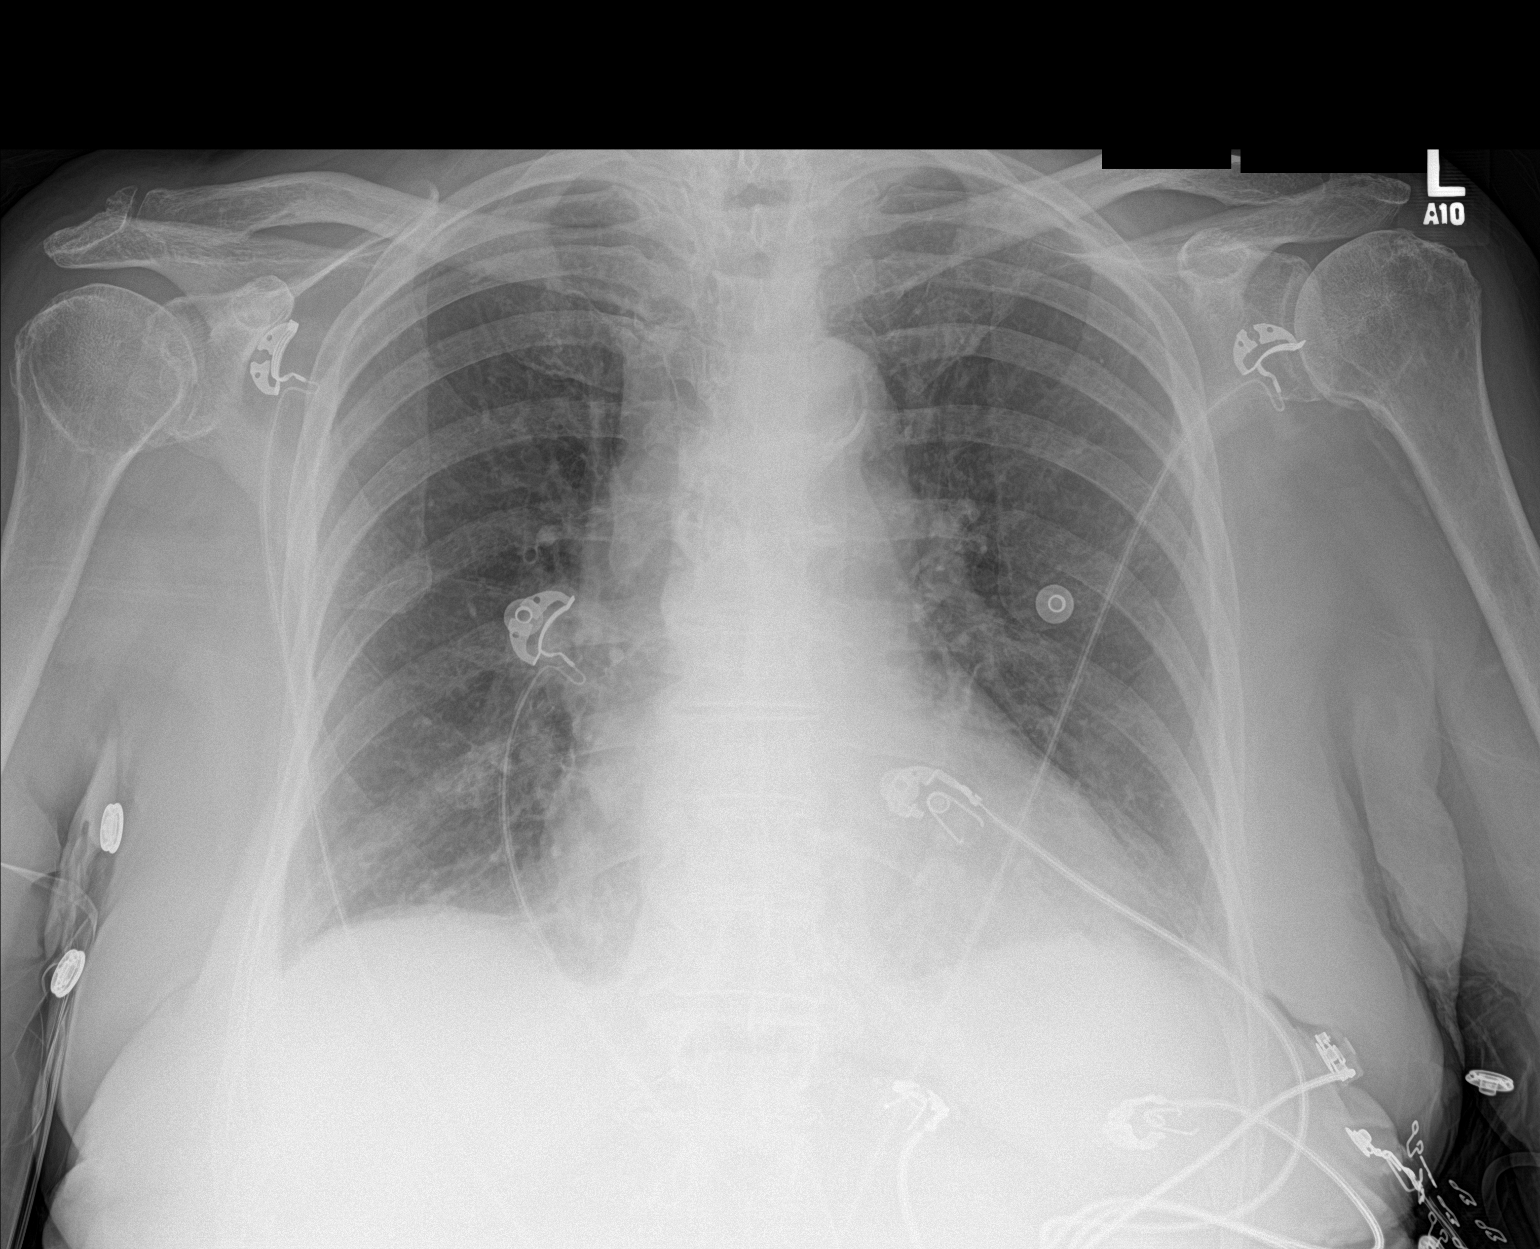

[1 of 1 positions shown; findings below may reference images not displayed]

FINDINGS: There is moderate cardiac enlargement. There is aortic
atherosclerosis. No pleural effusion or edema. No airspace
opacification.
IMPRESSION: 1. No acute findings.
2. Mild cardiac enlargement and aortic atherosclerosis.

## 2017-12-10 ENCOUNTER — Ambulatory Visit (INDEPENDENT_AMBULATORY_CARE_PROVIDER_SITE_OTHER): Payer: Medicare Other | Admitting: Internal Medicine

## 2017-12-10 ENCOUNTER — Encounter: Payer: Self-pay | Admitting: Internal Medicine

## 2017-12-10 VITALS — BP 110/60 | HR 74 | Temp 97.7°F

## 2017-12-10 DIAGNOSIS — R2689 Other abnormalities of gait and mobility: Secondary | ICD-10-CM | POA: Diagnosis not present

## 2017-12-10 DIAGNOSIS — I10 Essential (primary) hypertension: Secondary | ICD-10-CM

## 2017-12-10 DIAGNOSIS — E039 Hypothyroidism, unspecified: Secondary | ICD-10-CM | POA: Diagnosis not present

## 2017-12-10 DIAGNOSIS — I4891 Unspecified atrial fibrillation: Secondary | ICD-10-CM | POA: Diagnosis not present

## 2017-12-10 NOTE — Patient Instructions (Signed)
Limit your sodium (Salt) intake  Cardiology follow-up as scheduled  Return in 6 months for follow-up  

## 2017-12-10 NOTE — Progress Notes (Signed)
Subjective:    Patient ID: Susan [name], female    DOB: 07-Aug-1918, 84 y.o.   MRN: [id number]  HPI 84 year old patient who is seen today for her semiannual follow-up.  She is also followed by cardiology with a history of aortic stenosis and paroxysmal atrial fibrillation. She is doing well without cardiopulmonary complaints.  She has a history of hypothyroidism.  She has had a recent cardiology follow-up as well. Medical regimen reviewed  Past Medical History:  Diagnosis Date  . Aortic stenosis   . Atrial fibrillation, new onset ([place])   . CARCINOMA, BLADDER, TRANSITIONAL CELL 10/24/2007  . GERD (gastroesophageal reflux disease)   . HYPERTENSION 01/07/2007  . HYPOTHYROIDISM 01/07/2007  . IBS 05/03/2008  . MIXED HEARING LOSS BILATERAL 09/28/2009  . OSTEOARTHRITIS, KNEE 04/25/2007  . UNSPECIFIED ARTHROPATHY MULTIPLE SITES 10/26/2009     Social History   Socioeconomic History  . Marital status: Widowed    Spouse name: Not on file  . Number of children: 3  . Years of education: Not on file  . Highest education level: Not on file  Occupational History  . Occupation: retired    Fish farm manager: RETIRED  Social Needs  . Financial resource strain: Not on file  . Food insecurity:    Worry: Not on file    Inability: Not on file  . Transportation needs:    Medical: Not on file    Non-medical: Not on file  Tobacco Use  . Smoking status: Former Smoker    Packs/day: 0.50    Years: 20.00    Pack years: 10.00    Types: Cigarettes    Last attempt to quit: 09/05/1973    Years since quitting: 44.2  . Smokeless tobacco: Never Used  Substance and Sexual Activity  . Alcohol use: Yes    Comment: ocass  . Drug use: No  . Sexual activity: Not on file  Lifestyle  . Physical activity:    Days per week: Not on file    Minutes per session: Not on file  . Stress: Not on file  Relationships  . Social connections:    Talks on phone: Not on file    Gets together: Not on file    Attends religious  service: Not on file    Active member of club or organization: Not on file    Attends meetings of clubs or organizations: Not on file    Relationship status: Not on file  . Intimate partner violence:    Fear of current or ex partner: Not on file    Emotionally abused: Not on file    Physically abused: Not on file    Forced sexual activity: Not on file  Other Topics Concern  . Not on file  Social History Narrative  . Not on file    Past Surgical History:  Procedure Laterality Date  . BELPHAROPTOSIS REPAIR    . BLADDER TUMOR EXCISION    . BUNIONECTOMY     bilateral  . CATARACT EXTRACTION     bilateral  . TONSILLECTOMY AND ADENOIDECTOMY     x2    Family History  Problem Relation Age of Onset  . Stomach cancer Mother   . Arthritis Mother   . Leukemia Father   . Kidney failure Father   . Anuerysm Brother        brain  . ALS Sister   . Stomach cancer Maternal Aunt   . Colon cancer Daughter   . Esophageal cancer Neg Hx   .  Rectal cancer Neg Hx     Allergies  Allergen Reactions  . Penicillins Diarrhea    Has patient had a PCN reaction causing immediate rash, facial/tongue/throat swelling, SOB or lightheadedness with hypotension: YES Has patient had a PCN reaction causing severe rash involving mucus membranes or skin necrosis:NO Has patient had a PCN reaction that required hospitalization NO Has patient had a PCN reaction occurring within the last 10 years: NO If all of the above answers are "NO", then may proceed with Cephalosporin use.  . Ace Inhibitors     REACTION: cough  . Aspirin   . Hydrocodone     REACTION: Hallucinations---minor  . Nsaids     Current Outpatient Medications on File Prior to Visit  Medication Sig Dispense Refill  . acetaminophen (TYLENOL) 325 MG tablet Take 2 tablets (650 mg total) by mouth 3 (three) times Borski.    Marland Kitchen atorvastatin (LIPITOR) 20 MG tablet TAKE 1 TABLET ONCE [place]. 90 tablet 0  . calcium carbonate (TUMS) 500 MG chewable tablet  Chew 1 tablet by mouth as needed.      . carvedilol (COREG) 12.5 MG tablet Take 6.25 mg by mouth 2 (two) times [place].    . famotidine (PEPCID) 20 MG tablet TAKE 1 TABLET TWICE P[place]. 60 tablet 0  . Glucosamine-Chondroitin 750-600 MG TABS Take 1 tablet by mouth 3 (three) times Hedberg.    . hydrocortisone (ANUSOL-HC) 2.5 % rectal cream Place 1 application rectally 2 (two) times Brunner as needed.    Marland Kitchen levothyroxine (SYNTHROID, LEVOTHROID) 75 MCG tablet Take 0.5 tablets (37.5 mcg total) by mouth K[place] before breakfast. 30 tablet 0  . Loperamide HCl 2 MG CHEW 1 tablet every 6 hours as needed for diarrhea.  May take as a preventive measure prior to visits out of the facility 30 tablet 3  . Multiple Vitamin (MULTIVITAMIN) tablet Take 2 tablets by mouth Holst.     . ondansetron (ZOFRAN) 8 MG tablet Take 1 tablet (8 mg total) by mouth every 8 (eight) hours as needed for nausea or vomiting. 20 tablet 0  . Psyllium 400 MG CAPS Take 400 mg by mouth 2 (two) times [place]. WITH 4-6 OZ OF WATER     No current facility-administered medications on file prior to visit.     BP 110/60 (BP Location: Right Arm, Patient Position: Sitting, Cuff Size: Normal)   Pulse 74   Temp 97.7 F (36.5 C) (Oral)   SpO2 94%      Review of Systems  Constitutional: Negative.   HENT: Negative for congestion, dental problem, hearing loss, rhinorrhea, sinus pressure, sore throat and tinnitus.   Eyes: Negative for pain, discharge and visual disturbance.  Respiratory: Negative for cough and shortness of breath.   Cardiovascular: Negative for chest pain, palpitations and leg swelling.  Gastrointestinal: Negative for abdominal distention, abdominal pain, blood in stool, constipation, diarrhea, nausea and vomiting.  Genitourinary: Negative for difficulty urinating, dysuria, flank pain, frequency, hematuria, pelvic pain, urgency, vaginal bleeding, vaginal discharge and vaginal pain.  Musculoskeletal: Positive for arthralgias, back pain  and gait problem. Negative for joint swelling.  Skin: Negative for rash.  Neurological: Negative for dizziness, syncope, speech difficulty, weakness, numbness and headaches.  Hematological: Negative for adenopathy.  Psychiatric/Behavioral: Negative for agitation, behavioral problems and dysphoric mood. The patient is not nervous/anxious.        Objective:   Physical Exam  Constitutional: She is oriented to person, place, and time. She appears well-developed and well-nourished.  HENT:  Head: Normocephalic.  Right Ear: External ear normal.  Left Ear: External ear normal.  Mouth/Throat: Oropharynx is clear and moist.  Eyes: Pupils are equal, round, and reactive to light. Conjunctivae and EOM are normal.  Neck: Normal range of motion. Neck supple. No thyromegaly present.  Cardiovascular: Normal rate, regular rhythm and intact distal pulses.  Murmur heard. Grade 9-5/0 systolic murmur heard diffusely  Pulmonary/Chest: Effort normal and breath sounds normal.  Abdominal: Soft. Bowel sounds are normal. She exhibits no mass. There is no tenderness.  Musculoskeletal: Normal range of motion.  Lymphadenopathy:    She has no cervical adenopathy.  Neurological: She is alert and oriented to person, place, and time.  Skin: Skin is warm and dry. No rash noted.  Psychiatric: She has a normal mood and affect. Her behavior is normal.          Assessment & Plan:   Aortic stenosis.  Asymptomatic Paroxysmal atrial fibrillation.  Remains normal sinus rhythm Hypothyroidism  No change in medical regimen Medications updated  Follow-up 6 months with lab  Nyoka Cowden

## 2018-03-11 ENCOUNTER — Ambulatory Visit (INDEPENDENT_AMBULATORY_CARE_PROVIDER_SITE_OTHER): Payer: Medicare Other | Admitting: Internal Medicine

## 2018-03-11 ENCOUNTER — Encounter: Payer: Self-pay | Admitting: Internal Medicine

## 2018-03-11 VITALS — BP 150/100 | HR 79 | Temp 98.2°F

## 2018-03-11 DIAGNOSIS — R2689 Other abnormalities of gait and mobility: Secondary | ICD-10-CM | POA: Diagnosis not present

## 2018-03-11 DIAGNOSIS — I10 Essential (primary) hypertension: Secondary | ICD-10-CM

## 2018-03-11 DIAGNOSIS — E039 Hypothyroidism, unspecified: Secondary | ICD-10-CM

## 2018-03-11 DIAGNOSIS — I350 Nonrheumatic aortic (valve) stenosis: Secondary | ICD-10-CM

## 2018-03-11 MED ORDER — ATORVASTATIN CALCIUM 20 MG PO TABS: 20.0000 mg | ORAL_TABLET | Freq: Every day | ORAL | 4 refills | Status: AC

## 2018-03-11 MED ORDER — LEVOTHYROXINE SODIUM 75 MCG PO TABS: 37.5000 ug | ORAL_TABLET | Freq: Every day | ORAL | 4 refills | Status: AC

## 2018-03-11 MED ORDER — CARVEDILOL 12.5 MG PO TABS: 6.2500 mg | ORAL_TABLET | Freq: Two times a day (BID) | ORAL | 4 refills | Status: AC

## 2018-03-11 NOTE — Patient Instructions (Addendum)
Urinary Incontinence Urinary incontinence is the involuntary loss of urine from your bladder. What are the causes? There are many causes of urinary incontinence. They include:  Medicines.  Infections.  Prostatic enlargement, leading to overflow of urine from your bladder.  Surgery.  Neurological diseases.  Emotional factors.  What are the signs or symptoms? Urinary Incontinence can be divided into four types: 1. Urge incontinence. Urge incontinence is the involuntary loss of urine before you have the opportunity to go to the bathroom. There is a sudden urge to void but not enough time to reach a bathroom. 2. Stress incontinence. Stress incontinence is the sudden loss of urine with any activity that forces urine to pass. It is commonly caused by anatomical changes to the pelvis and sphincter areas of your body. 3. Overflow incontinence. Overflow incontinence is the loss of urine from an obstructed opening to your bladder. This results in a backup of urine and a resultant buildup of pressure within the bladder. When the pressure within the bladder exceeds the closing pressure of the sphincter, the urine overflows, which causes incontinence, similar to water overflowing a dam. 4. Total incontinence. Total incontinence is the loss of urine as a result of the inability to store urine within your bladder.  How is this diagnosed? Evaluating the cause of incontinence may require:  A thorough and complete medical and obstetric history.  A complete physical exam.  Laboratory tests such as a urine culture and sensitivities.  When additional tests are indicated, they can include:  An ultrasound exam.  Kidney and bladder X-rays.  Cystoscopy. This is an exam of the bladder using a narrow scope.  Urodynamic testing to test the nerve function to the bladder and sphincter areas.  How is this treated? Treatment for urinary incontinence depends on the cause:  For urge incontinence caused  by a bacterial infection, antibiotics will be prescribed. If the urge incontinence is related to medicines you take, your health care provider may have you change the medicine.  For stress incontinence, surgery to re-establish anatomical support to the bladder or sphincter, or both, will often correct the condition.  For overflow incontinence caused by an enlarged prostate, an operation to open the channel through the enlarged prostate will allow the flow of urine out of the bladder. In women with fibroids, a hysterectomy may be recommended.  For total incontinence, surgery on your urinary sphincter may help. An artificial urinary sphincter (an inflatable cuff placed around the urethra) may be required. In women who have developed a hole-like passage between their bladder and vagina (vesicovaginal fistula), surgery to close the fistula often is required.  Follow these instructions at home:  Normal Marohl hygiene and the use of pads or adult diapers that are changed regularly will help prevent odors and skin damage.  Avoid caffeine. It can overstimulate your bladder.  Use the bathroom regularly. Try about every 2-3 hours to go to the bathroom, even if you do not feel the need to do so. Take time to empty your bladder completely. After urinating, wait a minute. Then try to urinate again.  For causes involving nerve dysfunction, keep a log of the medicines you take and a journal of the times you go to the bathroom. Contact a health care provider if:  You experience worsening of pain instead of improvement in pain after your procedure.  Your incontinence becomes worse instead of better. Get help right away if:  You experience fever or shaking chills.  You are unable to   pass your urine.  You have redness spreading into your groin or down into your thighs. This information is not intended to replace advice given to you by your health care provider. Make sure you discuss any questions you have  with your health care provider. Document Released: 08/02/2004 Document Revised: 02/03/2016 Document Reviewed: 12/02/2012 Elsevier Interactive Patient Education  2018 [employer].  Kegel Exercises Kegel exercises help strengthen the muscles that support the rectum, vagina, small intestine, bladder, and uterus. Doing Kegel exercises can help:  Improve bladder and bowel control.  Improve sexual response.  Reduce problems and discomfort during pregnancy.  Kegel exercises involve squeezing your pelvic floor muscles, which are the same muscles you squeeze when you try to stop the flow of urine. The exercises can be done while sitting, standing, or lying down, but it is best to vary your position. Phase 1 exercises 5. Squeeze your pelvic floor muscles tight. You should feel a tight lift in your rectal area. If you are a female, you should also feel a tightness in your vaginal area. Keep your stomach, buttocks, and legs relaxed. 6. Hold the muscles tight for up to 10 seconds. 7. Relax your muscles. Repeat this exercise 50 times a day or as many times as told by your health care provider. Continue to do this exercise for at least 4-6 weeks or for as long as told by your health care provider. This information is not intended to replace advice given to you by your health care provider. Make sure you discuss any questions you have with your health care provider. Document Released: 06/11/2012 Document Revised: 02/18/2016 Document Reviewed: 05/15/2015 Elsevier Interactive Patient Education  [employer].

## 2018-03-11 NOTE — Progress Notes (Signed)
Subjective:    Patient ID: Susan [name], female    DOB: 02/02/1919, 84 y.o.   MRN: [id number]  HPI  84 year old patient who is seen today for follow-up.  She has essential hypertension and also a history of aortic stenosis.  She has a history of Gait instability and uses a powered wheelchair.  She has a history of essential hypertension as well as hypothyroidism. She is accompanied by her daughter today.  Resident of [facility].  Main issues continue to be some urinary and fecal incontinence  Past Medical History:  Diagnosis Date  . Aortic stenosis   . Atrial fibrillation, new onset ([place])   . CARCINOMA, BLADDER, TRANSITIONAL CELL 10/24/2007  . GERD (gastroesophageal reflux disease)   . HYPERTENSION 01/07/2007  . HYPOTHYROIDISM 01/07/2007  . IBS 05/03/2008  . MIXED HEARING LOSS BILATERAL 09/28/2009  . OSTEOARTHRITIS, KNEE 04/25/2007  . UNSPECIFIED ARTHROPATHY MULTIPLE SITES 10/26/2009     Social History   Socioeconomic History  . Marital status: Widowed    Spouse name: Not on file  . Number of children: 3  . Years of education: Not on file  . Highest education level: Not on file  Occupational History  . Occupation: retired    Fish farm manager: RETIRED  Social Needs  . Financial resource strain: Not on file  . Food insecurity:    Worry: Not on file    Inability: Not on file  . Transportation needs:    Medical: Not on file    Non-medical: Not on file  Tobacco Use  . Smoking status: Former Smoker    Packs/day: 0.50    Years: 20.00    Pack years: 10.00    Types: Cigarettes    Last attempt to quit: 09/05/1973    Years since quitting: 44.5  . Smokeless tobacco: Never Used  Substance and Sexual Activity  . Alcohol use: Yes    Comment: ocass  . Drug use: No  . Sexual activity: Not on file  Lifestyle  . Physical activity:    Days per week: Not on file    Minutes per session: Not on file  . Stress: Not on file  Relationships  . Social connections:    Talks on phone: Not on  file    Gets together: Not on file    Attends religious service: Not on file    Active member of club or organization: Not on file    Attends meetings of clubs or organizations: Not on file    Relationship status: Not on file  . Intimate partner violence:    Fear of current or ex partner: Not on file    Emotionally abused: Not on file    Physically abused: Not on file    Forced sexual activity: Not on file  Other Topics Concern  . Not on file  Social History Narrative  . Not on file    Past Surgical History:  Procedure Laterality Date  . BELPHAROPTOSIS REPAIR    . BLADDER TUMOR EXCISION    . BUNIONECTOMY     bilateral  . CATARACT EXTRACTION     bilateral  . TONSILLECTOMY AND ADENOIDECTOMY     x2    Family History  Problem Relation Age of Onset  . Stomach cancer Mother   . Arthritis Mother   . Leukemia Father   . Kidney failure Father   . Anuerysm Brother        brain  . ALS Sister   . Stomach cancer Maternal Aunt   .  Colon cancer Daughter   . Esophageal cancer Neg Hx   . Rectal cancer Neg Hx     Allergies  Allergen Reactions  . Penicillins Diarrhea    Has patient had a PCN reaction causing immediate rash, facial/tongue/throat swelling, SOB or lightheadedness with hypotension: YES Has patient had a PCN reaction causing severe rash involving mucus membranes or skin necrosis:NO Has patient had a PCN reaction that required hospitalization NO Has patient had a PCN reaction occurring within the last 10 years: NO If all of the above answers are "NO", then may proceed with Cephalosporin use.  . Ace Inhibitors     REACTION: cough  . Aspirin   . Hydrocodone     REACTION: Hallucinations---minor  . Nsaids     Current Outpatient Medications on File Prior to Visit  Medication Sig Dispense Refill  . acetaminophen (TYLENOL) 325 MG tablet Take 2 tablets (650 mg total) by mouth 3 (three) times [place]el[place].    Marland Kitchen atorvastatin (LIPITOR) 20 MG tablet TAKE 1 TABLET ONCE Kindall. 90  tablet 0  . calcium carbonate (TUMS) 500 MG chewable tablet Chew 1 tablet by mouth as needed.      . carvedilol (COREG) 12.5 MG tablet Take 6.25 mg by mouth 2 (two) times [place]well.    . famotidine (PEPCID) 20 MG tablet TAKE 1 TABLET TWICE Vasudevan. 60 tablet 0  . Glucosamine-Chondroitin 750-600 MG TABS Take 1 tablet by mouth 3 (three) times Szafran.    . hydrocortisone (ANUSOL-HC) 2.5 % rectal cream Place 1 application rectally 2 (two) times Vanhoesen as needed.    Marland Kitchen levothyroxine (SYNTHROID, LEVOTHROID) 75 MCG tablet Take 0.5 tablets (37.5 mcg total) by mouth Sangha before breakfast. 30 tablet 0  . Loperamide HCl 2 MG CHEW 1 tablet every 6 hours as needed for diarrhea.  May take as a preventive measure prior to visits out of the facility 30 tablet 3  . Multiple Vitamin (MULTIVITAMIN) tablet Take 2 tablets by mouth Meissner.     . ondansetron (ZOFRAN) 8 MG tablet Take 1 tablet (8 mg total) by mouth every 8 (eight) hours as needed for nausea or vomiting. 20 tablet 0  . Psyllium 400 MG CAPS Take 400 mg by mouth 2 (two) times Kehl. WITH 4-6 OZ OF WATER     No current facility-administered medications on file prior to visit.     BP (!) 150/100 (BP Location: Right Arm, Patient Position: Sitting, Cuff Size: Normal)   Pulse 79   Temp 98.2 F (36.8 C) (Oral)   SpO2 96%     Review of Systems  Constitutional: Negative.   HENT: Negative for congestion, dental problem, hearing loss, rhinorrhea, sinus pressure, sore throat and tinnitus.   Eyes: Negative for pain, discharge and visual disturbance.  Respiratory: Negative for cough and shortness of breath.   Cardiovascular: Negative for chest pain, palpitations and leg swelling.  Gastrointestinal: Positive for diarrhea. Negative for abdominal distention, abdominal pain, blood in stool, constipation, nausea and vomiting.  Genitourinary: Positive for difficulty urinating. Negative for dysuria, flank pain, frequency, hematuria, pelvic pain, urgency, vaginal bleeding,  vaginal discharge and vaginal pain.  Musculoskeletal: Positive for gait problem. Negative for arthralgias and joint swelling.  Skin: Negative for rash.  Neurological: Negative for dizziness, syncope, speech difficulty, weakness, numbness and headaches.  Hematological: Negative for adenopathy.  Psychiatric/Behavioral: Negative for agitation, behavioral problems and dysphoric mood. The patient is not nervous/anxious.        Objective:   Physical Exam  Constitutional: She is oriented  to person, place, and time. She appears well-developed and well-nourished. No distress.   Wheelchair-bound Alert no distress Blood pressure 146/60 Hard of hearing  HENT:  Head: Normocephalic.  Right Ear: External ear normal.  Left Ear: External ear normal.  Mouth/Throat: Oropharynx is clear and moist.  Eyes: Pupils are equal, round, and reactive to light. Conjunctivae and EOM are normal.  Neck: Normal range of motion. Neck supple. No thyromegaly present.  Transmitted murmur to the carotid circulation  Cardiovascular: Normal rate, regular rhythm and intact distal pulses.  Murmur heard. Grade 4/6 systolic murmur harsh loudest over the primary aortic area  Pulmonary/Chest: Effort normal and breath sounds normal.  Abdominal: Soft. Bowel sounds are normal. She exhibits no mass. There is no tenderness.  Musculoskeletal: Normal range of motion.  Lymphadenopathy:    She has no cervical adenopathy.  Neurological: She is alert and oriented to person, place, and time.  Skin: Skin is warm and dry. No rash noted.  Psychiatric: She has a normal mood and affect. Her behavior is normal.          Assessment & Plan:   Aortic stenosis.  Stable without history of chest pain shortness of breath or syncope Essential hypertension.  Blood pressure elevated on arrival but normalized.  No change in therapy Stress incontinence Hypothyroidism.  Continue levothyroxine  Patient plans to establish with a new provider at  her extended care facility All medications updated  Marletta Lor

## 2018-03-31 ENCOUNTER — Telehealth: Payer: Self-pay | Admitting: Cardiology

## 2018-03-31 NOTE — Telephone Encounter (Signed)
Spoke with [employer] @ [place]. She has complained of chest tightness for 1 week only at night - around 1am and 430am. She also experiences nausea at the same time. She will complete EKG and fax this over with med list and synopsis of events for provider to review.

## 2018-03-31 NOTE — Telephone Encounter (Signed)
New Message    Judeen Hammans with [facility] is calling because the patients advised them today that at night she has been have a chest tightness for the past week. But its only at night. She says she has bouts of nausea and last night it was the worse. Judeen Hammans wants to know if they can do a EKG for patient as well to rule out cardiac. Please call to advise.

## 2018-04-01 NOTE — Telephone Encounter (Signed)
I checked in Dr.Hochrein's box for this EKG and did not see it.  Going to send to his nurse to see if she has seen it.

## 2018-04-09 NOTE — Telephone Encounter (Signed)
Have not received EKG from [facility] on pt.

## 2018-04-11 ENCOUNTER — Ambulatory Visit: Payer: Medicare Other | Admitting: Family Medicine

## 2018-04-11 ENCOUNTER — Emergency Department (HOSPITAL_COMMUNITY)
Admission: EM | Admit: 2018-04-11 | Discharge: 2018-04-11 | Disposition: A | Discharge status: Home / Self Care | Payer: Medicare Other | Attending: Emergency Medicine | Admitting: Emergency Medicine

## 2018-04-11 ENCOUNTER — Telehealth: Payer: Self-pay | Admitting: Cardiology

## 2018-04-11 ENCOUNTER — Other Ambulatory Visit: Payer: Self-pay

## 2018-04-11 ENCOUNTER — Emergency Department (HOSPITAL_COMMUNITY): Payer: Medicare Other

## 2018-04-11 ENCOUNTER — Encounter: Payer: Self-pay | Admitting: Cardiology

## 2018-04-11 ENCOUNTER — Encounter (HOSPITAL_COMMUNITY): Payer: Self-pay

## 2018-04-11 ENCOUNTER — Telehealth: Payer: Self-pay

## 2018-04-11 DIAGNOSIS — R0789 Other chest pain: Secondary | ICD-10-CM | POA: Diagnosis not present

## 2018-04-11 DIAGNOSIS — Z87891 Personal history of nicotine dependence: Secondary | ICD-10-CM | POA: Diagnosis not present

## 2018-04-11 DIAGNOSIS — Z79899 Other long term (current) drug therapy: Secondary | ICD-10-CM | POA: Insufficient documentation

## 2018-04-11 DIAGNOSIS — R197 Diarrhea, unspecified: Secondary | ICD-10-CM | POA: Insufficient documentation

## 2018-04-11 DIAGNOSIS — R079 Chest pain, unspecified: Secondary | ICD-10-CM

## 2018-04-11 DIAGNOSIS — I10 Essential (primary) hypertension: Secondary | ICD-10-CM | POA: Insufficient documentation

## 2018-04-11 DIAGNOSIS — J90 Pleural effusion, not elsewhere classified: Secondary | ICD-10-CM | POA: Insufficient documentation

## 2018-04-11 DIAGNOSIS — E039 Hypothyroidism, unspecified: Secondary | ICD-10-CM | POA: Insufficient documentation

## 2018-04-11 DIAGNOSIS — R0902 Hypoxemia: Secondary | ICD-10-CM | POA: Diagnosis not present

## 2018-04-11 LAB — COMPREHENSIVE METABOLIC PANEL
ALK PHOS: 51 U/L (ref 38–126)
ALT: 15 U/L (ref 0–44)
ANION GAP: 7 (ref 5–15)
AST: 23 U/L (ref 15–41)
Albumin: 3.7 g/dL (ref 3.5–5.0)
BUN: 13 mg/dL (ref 8–23)
CO2: 26 mmol/L (ref 22–32)
Calcium: 9.2 mg/dL (ref 8.9–10.3)
Chloride: 108 mmol/L (ref 98–111)
Creatinine, Ser: 0.84 mg/dL (ref 0.44–1.00)
GFR calc non Af Amer: 56 mL/min — ABNORMAL LOW (ref >60)
Glucose, Bld: 117 mg/dL — ABNORMAL HIGH (ref 70–99)
Potassium: 3.8 mmol/L (ref 3.5–5.1)
SODIUM: 141 mmol/L (ref 135–145)
TOTAL PROTEIN: 5.7 g/dL — AB (ref 6.5–8.1)
Total Bilirubin: 1.8 mg/dL — ABNORMAL HIGH (ref 0.3–1.2)

## 2018-04-11 LAB — CBC WITH DIFFERENTIAL/PLATELET
Abs Immature Granulocytes: 0 10*3/uL (ref 0.0–0.1)
BASOS ABS: 0 10*3/uL (ref 0.0–0.1)
BASOS PCT: 1 %
EOS ABS: 0.1 10*3/uL (ref 0.0–0.7)
Eosinophils Relative: 1 %
HCT: 37.3 % (ref 36.0–46.0)
Hemoglobin: 11.2 g/dL — ABNORMAL LOW (ref 12.0–15.0)
Immature Granulocytes: 0 %
Lymphocytes Relative: 29 %
Lymphs Abs: 2.1 10*3/uL (ref 0.7–4.0)
MCH: 29.9 pg (ref 26.0–34.0)
MCHC: 30 g/dL (ref 30.0–36.0)
MCV: 99.5 fL (ref 78.0–100.0)
Monocytes Absolute: 0.7 10*3/uL (ref 0.1–1.0)
Monocytes Relative: 9 %
Neutro Abs: 4.5 10*3/uL (ref 1.7–7.7)
Neutrophils Relative %: 60 %
PLATELETS: 166 10*3/uL (ref 150–400)
RBC: 3.75 MIL/uL — AB (ref 3.87–5.11)
RDW: 15.1 % (ref 11.5–15.5)
WBC: 7.5 10*3/uL (ref 4.0–10.5)

## 2018-04-11 LAB — LIPASE, BLOOD: Lipase: 30 U/L (ref 11–51)

## 2018-04-11 LAB — I-STAT TROPONIN, ED
TROPONIN I, POC: 0.01 ng/mL (ref 0.00–0.08)
Troponin i, poc: 0 ng/mL (ref 0.00–0.08)

## 2018-04-11 MED ORDER — ISOSORBIDE DINITRATE 10 MG PO TABS
10.0000 mg | ORAL_TABLET | Freq: Once | ORAL | Status: AC
  Administered 2018-04-11: 10 mg via ORAL
  Filled 2018-04-11: qty 1

## 2018-04-11 MED ORDER — ISOSORBIDE DINITRATE 10 MG PO TABS: 10.0000 mg | ORAL_TABLET | Freq: Two times a day (BID) | ORAL | 0 refills | Status: DC

## 2018-04-11 MED ORDER — ISOSORBIDE DINITRATE 10 MG PO TABS: 10.0000 mg | ORAL_TABLET | Freq: Two times a day (BID) | ORAL | 0 refills | Status: AC

## 2018-04-11 NOTE — ED Provider Notes (Signed)
[facility] EMERGENCY DEPARTMENT Provider Note   CSN: [id number] Arrival date & time: 04/11/18  1425     History   Chief Complaint Chief Complaint  Patient presents with  . Chest Pain  . Diarrhea    HPI Susan [name] is a 84 y.o. female.  She is obtained from patient and from patient's daughter.  Patient complains of anterior chest pain described as pressure intermittent for the past 2 to 3 weeks.  Worse at night.  Symptoms accompanied by nausea no shortness of breath sometimes she feels somewhat sweaty.  She feels as if a tight jacket is around her chest.  She is presently asymptomatic she is had several episodes per day.  She is presently asymptomatic her last episode was this morning.  Patient reports that she has diarrhea a few episodes per day for the past 4 years due to IBS which is nonbloody and that is not the reason that she came today.  HPI  Past Medical History:  Diagnosis Date  . Aortic stenosis   . Atrial fibrillation, new onset ([place])   . CARCINOMA, BLADDER, TRANSITIONAL CELL 10/24/2007  . GERD (gastroesophageal reflux disease)   . HYPERTENSION 01/07/2007  . HYPOTHYROIDISM 01/07/2007  . IBS 05/03/2008  . MIXED HEARING LOSS BILATERAL 09/28/2009  . OSTEOARTHRITIS, KNEE 04/25/2007  . UNSPECIFIED ARTHROPATHY MULTIPLE SITES 10/26/2009    Patient Active Problem List   Diagnosis Date Noted  . Functional gait abnormality 10/15/2017  . Paroxysmal atrial fibrillation ([place]) 04/30/2016  . Atrial fibrillation with RVR ([place])   . Rectal bleeding 05/19/2014  . Hemorrhoid 05/19/2014  . Esophageal reflux 05/19/2014  . Pure sensory lacunar syndrome 01/29/2013  . Anemia 11/10/2012  . Aortic stenosis 07/26/2011  . Bruit 07/26/2011  . Dizziness 06/27/2011  . MIXED HEARING LOSS BILATERAL 09/28/2009  . IBS 05/03/2008  . CARCINOMA, BLADDER, TRANSITIONAL CELL 10/24/2007  . Osteoarthrosis involving lower leg 04/25/2007  . Hypothyroidism 01/07/2007  . Essential  hypertension 01/07/2007    Past Surgical History:  Procedure Laterality Date  . BELPHAROPTOSIS REPAIR    . BLADDER TUMOR EXCISION    . BUNIONECTOMY     bilateral  . CATARACT EXTRACTION     bilateral  . TONSILLECTOMY AND ADENOIDECTOMY     x2     OB History   None      Home Medications    Prior to Admission medications   Medication Sig Start Date End Date Taking? Authorizing Provider  acetaminophen (TYLENOL) 325 MG tablet Take 2 tablets (650 mg total) by mouth 3 (three) times Gerwig. 04/11/17   Marletta Lor, MD  atorvastatin (LIPITOR) 20 MG tablet Take 1 tablet (20 mg total) by mouth [place]on. 03/11/18   Marletta Lor, MD  calcium carbonate (TUMS) 500 MG chewable tablet Chew 1 tablet by mouth as needed.      [provider]  carvedilol (COREG) 12.5 MG tablet Take 0.5 tablets (6.25 mg total) by mouth 2 (two) times Morillo. 03/11/18   Marletta Lor, MD  famotidine (PEPCID) 20 MG tablet TAKE 1 TABLET TWICE Abee. 07/30/16   Milus Banister, MD  Glucosamine-Chondroitin 750-600 MG TABS Take 1 tablet by mouth 3 (three) times Parlier.    [provider]  hydrocortisone (ANUSOL-HC) 2.5 % rectal cream Place 1 application rectally 2 (two) times [place] as needed.    [provider]  levothyroxine (SYNTHROID, LEVOTHROID) 75 MCG tablet Take 0.5 tablets (37.5 mcg total) by mouth Bogus before breakfast. 03/11/18  Marletta Lor, MD  Loperamide HCl 2 MG CHEW 1 tablet every 6 hours as needed for diarrhea.  May take as a preventive measure prior to visits out of the facility 02/26/17   Marletta Lor, MD  Multiple Vitamin (MULTIVITAMIN) tablet Take 2 tablets by mouth Heal.     [provider]  ondansetron (ZOFRAN) 8 MG tablet Take 1 tablet (8 mg total) by mouth every 8 (eight) hours as needed for nausea or vomiting. 04/19/17   Daleen Bo, MD  Psyllium 400 MG CAPS Take 400 mg by mouth 2 (two) times [place]. WITH 4-6 OZ OF WATER    [provider]    Family History Family History  Problem Relation Age of Onset  . Stomach cancer Mother   . Arthritis Mother   . Leukemia Father   . Kidney failure Father   . Anuerysm Brother        brain  . ALS Sister   . Stomach cancer Maternal Aunt   . Colon cancer Daughter   . Esophageal cancer Neg Hx   . Rectal cancer Neg Hx     Social History Social History   Tobacco Use  . Smoking status: Former Smoker    Packs/day: 0.50    Years: 20.00    Pack years: 10.00    Types: Cigarettes    Last attempt to quit: 09/05/1973    Years since quitting: 44.6  . Smokeless tobacco: Never Used  Substance Use Topics  . Alcohol use: Yes    Comment: ocass  . Drug use: No    DNR CODE STATUS lives in assisted living Allergies   Penicillins; Ace inhibitors; Aspirin; Hydrocodone; and Nsaids   Review of Systems Review of Systems  Cardiovascular: Positive for chest pain.  Gastrointestinal: Positive for diarrhea.       Chronic diarrhea  All other systems reviewed and are negative.    Physical Exam Updated Vital Signs BP (!) 144/82   Pulse 78   Temp 97.9 F (36.6 C) (Oral)   Resp (!) 25   Ht 5\' 2"  (1.575 m)   Wt 71.2 kg   SpO2 94%   BMI 28.72 kg/m   Physical Exam  Constitutional:  Frail-appearing  HENT:  Head: Normocephalic and atraumatic.  Eyes: Pupils are equal, round, and reactive to light. Conjunctivae are normal.  Neck: Neck supple. No tracheal deviation present. No thyromegaly present.  Carotid bruit the left side  Cardiovascular: Normal rate and regular rhythm.  No murmur heard. Pulmonary/Chest: Effort normal and breath sounds normal.  Abdominal: Soft. Bowel sounds are normal. She exhibits no distension. There is no tenderness.  Musculoskeletal: Normal range of motion. She exhibits no edema or tenderness.  Neurological: She is alert. Coordination normal.  Skin: Skin is warm and dry. No rash noted.  Psychiatric: She has a normal mood and affect.  Nursing  note and vitals reviewed.    ED Treatments / Results  Labs (all labs ordered are listed, but only abnormal results are displayed) Labs Reviewed  CBC WITH DIFFERENTIAL/PLATELET - Abnormal; Notable for the following components:      Result Value   RBC 3.75 (*)    Hemoglobin 11.2 (*)    All other components within normal limits  COMPREHENSIVE METABOLIC PANEL - Abnormal; Notable for the following components:   Glucose, Bld 117 (*)    Total Protein 5.7 (*)    Total Bilirubin 1.8 (*)    GFR calc non Af Amer 56 (*)  All other components within normal limits  LIPASE, BLOOD  I-STAT TROPONIN, ED    EKG None ED ECG REPORT   Date: 04/11/2018  Rate: .  Rhythm: normal sinus rhythm  QRS Axis: left  Intervals: normal  ST/T Wave abnormalities: nonspecific ST changes  Conduction Disutrbances:none  Narrative Interpretation:   Old EKG Reviewed: Inverted T wave in aVL not seen on 10/16/2017, otherwise no significant change  I have personally reviewed the EKG tracing and disagree with the computerized printout as noted.  No significant injury pattern or ST segment elevation Radiology Dg Chest Port 1 View  Result Date: 04/11/2018 CLINICAL DATA:  Chest pain EXAM: PORTABLE CHEST 1 VIEW COMPARISON:  04/26/2016 FINDINGS: Moderate left and small right pleural effusions obscuring much of the left lung. The adjacent lung is opacified, which could be atelectasis. There is no reported symptoms of infection. No Kerley lines where seen. Chronic cardiomegaly. Mitral annular calcification. IMPRESSION: Cardiomegaly with moderate left and small right pleural effusions partially obscuring the lung. Electronically Signed   By: Monte Fantasia M.D.   On: 04/11/2018 14:53    Procedures Procedures (including critical care time)  Medications Ordered in ED Medications - No data to display   Initial Impression / Assessment and Plan / ED Course  I have reviewed the triage vital signs and the nursing  notes.  Pertinent labs & imaging results that were available during my care of the patient were reviewed by me and considered in my medical decision making (see chart for details).   5 PM patient resting comfortably.  No distress.  She does report that she had "another episode of chest pain just a few minutes ago."  This case with [place] on-call for Dr.Hochrein.  And prescription isosorbide 10 mg twice Mcdermid.  She has an appoint with Dr.Hochein scheduled for 5 days from now.  I have also told patient and her family about left-sided pleural effusion which is not causing symptoms now and can be followed up by her primary care physician.  Diarrhea is chronic.  Chest pain is felt to be atypical but may be anginal in etiology.  Patient does not wish to stay in the hospital tonight.  Acute myocardial infarction has been ruled out by serial troponins    Chest x-ray viewed by me Results for orders placed or performed during the hospital encounter of 04/11/18  CBC with Differential  Result Value Ref Range   WBC 7.5 4.0 - 10.5 K/uL   RBC 3.75 (L) 3.87 - 5.11 MIL/uL   Hemoglobin 11.2 (L) 12.0 - 15.0 g/dL   HCT 37.3 36.0 - 46.0 %   MCV 99.5 78.0 - 100.0 fL   MCH 29.9 26.0 - 34.0 pg   MCHC 30.0 30.0 - 36.0 g/dL   RDW 15.1 11.5 - 15.5 %   Platelets 166 150 - 400 K/uL   Neutrophils Relative % 60 %   Neutro Abs 4.5 1.7 - 7.7 K/uL   Lymphocytes Relative 29 %   Lymphs Abs 2.1 0.7 - 4.0 K/uL   Monocytes Relative 9 %   Monocytes Absolute 0.7 0.1 - 1.0 K/uL   Eosinophils Relative 1 %   Eosinophils Absolute 0.1 0.0 - 0.7 K/uL   Basophils Relative 1 %   Basophils Absolute 0.0 0.0 - 0.1 K/uL   Immature Granulocytes 0 %   Abs Immature Granulocytes 0.0 0.0 - 0.1 K/uL  Comprehensive metabolic panel  Result Value Ref Range   Sodium 141 135 - 145 mmol/L  Potassium 3.8 3.5 - 5.1 mmol/L   Chloride 108 98 - 111 mmol/L   CO2 26 22 - 32 mmol/L   Glucose, Bld 117 (H) 70 - 99 mg/dL   BUN 13 8 - 23 mg/dL    Creatinine, Ser 0.84 0.44 - 1.00 mg/dL   Calcium 9.2 8.9 - 10.3 mg/dL   Total Protein 5.7 (L) 6.5 - 8.1 g/dL   Albumin 3.7 3.5 - 5.0 g/dL   AST 23 15 - 41 U/L   ALT 15 0 - 44 U/L   Alkaline Phosphatase 51 38 - 126 U/L   Total Bilirubin 1.8 (H) 0.3 - 1.2 mg/dL   GFR calc non Af Amer 56 (L) >60 mL/min   GFR calc Af Amer >60 >60 mL/min   Anion gap 7 5 - 15  Lipase, blood  Result Value Ref Range   Lipase 30 11 - 51 U/L  I-stat troponin, ED  Result Value Ref Range   Troponin i, poc 0.01 0.00 - 0.08 ng/mL   Comment 3          I-stat troponin, ED  Result Value Ref Range   Troponin i, poc 0.00 0.00 - 0.08 ng/mL   Comment 3           Dg Chest Port 1 View  Result Date: 04/11/2018 CLINICAL DATA:  Chest pain EXAM: PORTABLE CHEST 1 VIEW COMPARISON:  04/26/2016 FINDINGS: Moderate left and small right pleural effusions obscuring much of the left lung. The adjacent lung is opacified, which could be atelectasis. There is no reported symptoms of infection. No Kerley lines where seen. Chronic cardiomegaly. Mitral annular calcification. IMPRESSION: Cardiomegaly with moderate left and small right pleural effusions partially obscuring the lung. Electronically Signed   By: Monte Fantasia M.D.   On: 04/11/2018 14:53  Lab work remarkable for mild anemia otherwise negative  Final Clinical Impressions(s) / ED Diagnoses  Diagnoses #1 chest pain at rest #2 left-sided pleural effusion #3 chronic diarrhea Final diagnoses:  None    ED Discharge Orders    None       Orlie Dakin, MD 04/11/18 [phone]

## 2018-04-11 NOTE — Telephone Encounter (Signed)
Returned call to [facility] she states that EMS is already there to take pt to the hospital. Called and informed Engineer, maintenance (IT) of transport.

## 2018-04-11 NOTE — Telephone Encounter (Signed)
EKG's performed on 03/31/18 and 04/11/18 @ [facility] received via fax from [facility] EKG's reviewed and compared to the patient's previous EKG done in March 2019 with Dr.Crenshaw. Per Dr.Crenshaw the EKG is unchanged when compared to the prior. If the pt is having acute symptoms (chest pain, sob, syncope) she is to be taken to the ED for evaluation.  Called to give Sherri the update, was told that she is unavailable and the will have her call back.

## 2018-04-11 NOTE — Telephone Encounter (Signed)
They will fax.

## 2018-04-11 NOTE — Telephone Encounter (Signed)
Spoke with [facility] @ [facility]. She will perform an EKG today and fax a copy to our office for review. Adv her that the previous EKG from 9/23 hadn't been received. Sherri sts that she has faxed it over 3 times with confirmation. Provide an additional fax #. Pt has an appt with Jory Sims, D-Np on Wed 10/8.

## 2018-04-11 NOTE — Telephone Encounter (Signed)
Sherri is returning Lisa's call in regards to the EKG. Sherri stated PCP recommended taking patient to the ER and Sherri wanted to check to see if that would be the best solution.

## 2018-04-11 NOTE — Telephone Encounter (Signed)
Duplicate. See other message.

## 2018-04-11 NOTE — ED Notes (Signed)
Dr. J at bedside 

## 2018-04-11 NOTE — Telephone Encounter (Signed)
Returned call to [facility] she states that EMS is already there to take pt to the hospital. Called and informed Engineer, maintenance (IT) of transport.

## 2018-04-11 NOTE — Telephone Encounter (Signed)
New Message:      Sherri from [facility] is calling with the results of an EKG

## 2018-04-11 NOTE — ED Notes (Addendum)
Pt verbalized understanding of d/c instructions and has no further questions, VSS, NAD. Pt taken back to [facility] by family.

## 2018-04-11 NOTE — Discharge Instructions (Signed)
Take the medication as prescribed.  Keep scheduled appointment with Dr.Hochrein in 5 days.  Chest x-ray shows fluid aroun left lung.  No treatment needed presently.  Susan [name] can follow-up with her primary care physician to get that evaluated

## 2018-04-11 NOTE — ED Triage Notes (Signed)
Per [facility], pt from [facility] due to intermittent CP and diarrhea x 2 weeks. Pt has hx of IBS. VSS

## 2018-04-11 NOTE — ED Notes (Signed)
Pt assisted to bedside commode. Pt had episode of diarrhea, sample kept at bedside. Pt cleaned and helped back to bed.

## 2018-04-11 NOTE — Telephone Encounter (Signed)
  Can [facility] get an order to do a EKG on her today? Patient is still not feeling well.

## 2018-04-11 NOTE — Telephone Encounter (Signed)
Spoke with pt daughter voiced understanding that Dr Volanda Napoleon was concerned that pt was having chest tightness and pt might need to be seen at the ED. Daughter voiced understanding stated that she will take pt to the ED.

## 2018-04-14 NOTE — Progress Notes (Addendum)
Cardiology Office Note   Date:  04/16/2018   ID:  Susan [name], DOB 1919/01/25, MRN [id number]  PCP:  Gayland Curry, DO  Cardiologist:  [facility]  Chief Complaint  Patient presents with  . Atrial Fibrillation  . Hypertension  . Aortic Stenosis     History of Present Illness: Susan [name] is a 84 y.o. female who presents for ongoing assessment and management of AoV stenosis, PAF, HTN, with other history of hypothyroidism, bladder cancer, and GERD. Was last seen in the office on 10/17/2017. She is not on anticoagulation because of frequent falls.  She is a resident o [facility].   She comes today with complaints of abdominal pain, diarrhea, and some fullness in her chest. She was seen in the ER on 04/11/2018 for similar complaints. She was ruled out for ACS. She was started on low dose isosorbide 10 mg BID. She was started on Immodium for prn diarrhea relief.   She was also noted to have moderate left, and small right pleural effusions obscuring much of the the left lung. This was not treated with diuretics. She has some LEE bilaterally, more on the right than the left, dependent.   She has been having some bleeding after urination. She states she thinks she may have scratched herself when she was wiping after going to the bathroom.   Past Medical History:  Diagnosis Date  . Aortic stenosis   . Atrial fibrillation, new onset ([place])   . CARCINOMA, BLADDER, TRANSITIONAL CELL 10/24/2007  . GERD (gastroesophageal reflux disease)   . HYPERTENSION 01/07/2007  . HYPOTHYROIDISM 01/07/2007  . IBS 05/03/2008  . MIXED HEARING LOSS BILATERAL 09/28/2009  . OSTEOARTHRITIS, KNEE 04/25/2007  . UNSPECIFIED ARTHROPATHY MULTIPLE SITES 10/26/2009    Past Surgical History:  Procedure Laterality Date  . BELPHAROPTOSIS REPAIR    . BLADDER TUMOR EXCISION    . BUNIONECTOMY     bilateral  . CATARACT EXTRACTION     bilateral  . TONSILLECTOMY AND ADENOIDECTOMY     x2     Current  Outpatient Medications  Medication Sig Dispense Refill  . acetaminophen (TYLENOL) 325 MG tablet Take 2 tablets (650 mg total) by mouth 3 (three) times Ciresi.    Marland Kitchen atorvastatin (LIPITOR) 20 MG tablet Take 1 tablet (20 mg total) by mouth Guidroz. 90 tablet 4  . calcium carbonate (TUMS) 500 MG chewable tablet Chew 1 tablet by mouth as needed.      . carvedilol (COREG) 12.5 MG tablet Take 0.5 tablets (6.25 mg total) by mouth 2 (two) times [place]. 180 tablet 4  . famotidine (PEPCID) 20 MG tablet TAKE 1 TABLET TWICE Sonnenfeld. 60 tablet 0  . Glucosamine-Chondroitin 750-600 MG TABS Take 1 tablet by mouth 3 (three) times [place].    . hydrocortisone (ANUSOL-HC) 2.5 % rectal cream Place 1 application rectally 2 (two) times Robenson as needed.    . isosorbide dinitrate (ISORDIL) 10 MG tablet Take 1 tablet (10 mg total) by mouth 2 (two) times [place]cia. 60 tablet 0  . levothyroxine (SYNTHROID, LEVOTHROID) 75 MCG tablet Take 0.5 tablets (37.5 mcg total) by mouth Nine before breakfast. 90 tablet 4  . Loperamide HCl 2 MG CHEW 1 tablet every 6 hours as needed for diarrhea.  May take as a preventive measure prior to visits out of the facility 30 tablet 3  . Multiple Vitamin (MULTIVITAMIN) tablet Take 2 tablets by mouth Pinkerton.     . ondansetron (ZOFRAN) 8 MG tablet Take 1 tablet (  8 mg total) by mouth every 8 (eight) hours as needed for nausea or vomiting. 20 tablet 0  . Psyllium 400 MG CAPS Take 400 mg by mouth 2 (two) times Diedrich. WITH 4-6 OZ OF WATER    . hydrochlorothiazide (MICROZIDE) 12.5 MG capsule Take 1 capsule (12.5 mg total) by mouth [place]ac[place]te. 90 capsule 3   No current facility-administered medications for this visit.     Allergies:   Penicillins; Ace inhibitors; Aspirin; Hydrocodone; and Nsaids    Social History:  The patient  reports that she quit smoking about 44 years ago. Her smoking use included cigarettes. She has a 10.00 pack-year smoking history. She has never used smokeless tobacco. She reports that she  drinks alcohol. She reports that she does not use drugs.   Family History:  The patient's family history includes ALS in her sister; Anuerysm in her brother; Arthritis in her mother; Colon cancer in her daughter; Kidney failure in her father; Leukemia in her father; Stomach cancer in her maternal aunt and mother.    ROS: All other systems are reviewed and negative. Unless otherwise mentioned in H&P    PHYSICAL EXAM: VS:  BP (!) 157/81   Pulse 79   Ht 5\' 2"  (1.575 m)   Wt 150 lb 6.4 oz (68.2 kg)   BMI 27.51 kg/m  , BMI Body mass index is 27.51 kg/m. GEN: Well nourished, well developed, in no acute distress HEENT: normal Neck: no JVD, radiation murmur to carotid, or masses Cardiac:RRR; 3/6 systolic murmurs, rubs, or gallops,bilateral dependent  edema  Respiratory:  Clear to auscultation bilaterally, normal work of breathing. Diminished in the LLL.  GI: soft, nontender, nondistended, + BS MS: no deformity or atrophy Skin: warm and dry, no rash Neuro:  Strength and sensation are intact Psych: euthymic mood, full affect   EKG:  NSR LBBB, rate of 79 bpm.  Recent Labs: 04/11/2018: ALT 15; BUN 13; Creatinine, Ser 0.84; Hemoglobin 11.2; Platelets 166; Potassium 3.8; Sodium 141    Lipid Panel    Component Value Date/Time   CHOL 96 03-22-16 0303   TRIG 37 22-Mar-2016 0303   HDL 51 03-22-16 0303   CHOLHDL 1.9 2016/03/22 0303   VLDL 7 2016-03-22 0303   LDLCALC 38 Mar 22, 2016 0303      Wt Readings from Last 3 Encounters:  04/16/18 150 lb 6.4 oz (68.2 kg)  04/11/18 157 lb (71.2 kg)  10/17/17 152 lb 9.6 oz (69.2 kg)      Other studies Reviewed: Echocardiogram 03-22-16 Left ventricle: The cavity size was normal. There was moderate   concentric hypertrophy. Systolic function was normal. The   estimated ejection fraction was in the range of 60% to 65%. Wall   motion was normal; there were no regional wall motion   abnormalities. Doppler parameters are consistent with  abnormal   left ventricular relaxation (grade 1 diastolic dysfunction).   Doppler parameters are consistent with high ventricular filling   pressure. - Aortic valve: Valve mobility was restricted. There was moderate   stenosis. There was mild regurgitation. Peak velocity (S): 375   cm/s. Mean gradient (S): 31 mm Hg. Valve area (VTI): 1.13 cm^2.   Valve area (Vmax): 0.8 cm^2. Valve area (Vmean): 0.87 cm^2. - Mitral valve: Calcified annulus. Moderately thickened, moderately   calcified leaflets . There was mild regurgitation. Valve area by   continuity equation (using LVOT flow): 2.7 cm^2. - Left atrium: The atrium was severely dilated. - Right ventricle: The cavity size was normal. Wall  thickness was   normal. Systolic function was normal. - Right atrium: The atrium was mildly dilated. - Tricuspid valve: There was moderate regurgitation. - Pulmonary arteries: Systolic pressure was moderately increased.   PA peak pressure: 51 mm Hg (S).   ASSESSMENT AND PLAN:  1.  Hypertension: She has had some LEE which is likely dependent, but also continues to have moderately elevated BP. I will add low dose HCTZ 12.5 mg to her regimen to assist with both. Do not want to over diurese with severe AoV stenosis and drop preload. She will have follow up BMET in one week.   2. Hematuria: Will check UA to evaluate further, including abdominal pain with diarrhea. I will check CBC today as she states that she was "dripping blood." She has a history of bladder cancer. May need further testing if this persists. Defer to PCP.   3. Severe AoV stenosis: She is treated medially. Heart rate is controlled. She denies chest pain. She is not very active, wheelchair dependent.   4. Chronic Dependent Edema: She is also recommended to wear support hose.   5. Pleural Effusion: Moderate left and mild right noted. Will need to repeat her CXR.   Current medicines are reviewed at length with the patient today.    Labs/  tests ordered today include: CBC, UA, BMET. CXR.   Phill Myron. West Pugh, ANP, AACC   04/16/2018 10:25 AM    [facility] [address] Office [phone] Fax [phone]

## 2018-04-16 ENCOUNTER — Telehealth: Payer: Self-pay | Admitting: Cardiology

## 2018-04-16 ENCOUNTER — Encounter: Payer: Self-pay | Admitting: Adult Health

## 2018-04-16 ENCOUNTER — Ambulatory Visit (INDEPENDENT_AMBULATORY_CARE_PROVIDER_SITE_OTHER): Payer: Medicare Other | Admitting: Adult Health

## 2018-04-16 VITALS — BP 157/81 | HR 79 | Ht 62.0 in | Wt 150.4 lb

## 2018-04-16 DIAGNOSIS — D649 Anemia, unspecified: Secondary | ICD-10-CM | POA: Diagnosis not present

## 2018-04-16 DIAGNOSIS — Z79899 Other long term (current) drug therapy: Secondary | ICD-10-CM | POA: Diagnosis not present

## 2018-04-16 DIAGNOSIS — R319 Hematuria, unspecified: Secondary | ICD-10-CM | POA: Diagnosis not present

## 2018-04-16 DIAGNOSIS — I350 Nonrheumatic aortic (valve) stenosis: Secondary | ICD-10-CM | POA: Diagnosis not present

## 2018-04-16 DIAGNOSIS — I480 Paroxysmal atrial fibrillation: Secondary | ICD-10-CM | POA: Diagnosis not present

## 2018-04-16 DIAGNOSIS — J90 Pleural effusion, not elsewhere classified: Secondary | ICD-10-CM

## 2018-04-16 LAB — URINALYSIS
Bilirubin, UA: NEGATIVE
GLUCOSE, UA: NEGATIVE
Ketones, UA: NEGATIVE
Nitrite, UA: NEGATIVE
PH UA: 5.5 (ref 5.0–7.5)
PROTEIN UA: NEGATIVE
Specific Gravity, UA: 1.012 (ref 1.005–1.030)
Urobilinogen, Ur: 0.2 mg/dL (ref 0.2–1.0)

## 2018-04-16 LAB — CBC WITH DIFFERENTIAL/PLATELET
BASOS: 0 %
Basophils Absolute: 0 10*3/uL (ref 0.0–0.2)
EOS (ABSOLUTE): 0.1 10*3/uL (ref 0.0–0.4)
EOS: 1 %
HEMOGLOBIN: 12 g/dL (ref 11.1–15.9)
Hematocrit: 35.5 % (ref 34.0–46.6)
Immature Grans (Abs): 0 10*3/uL (ref 0.0–0.1)
Immature Granulocytes: 0 %
LYMPHS ABS: 1.7 10*3/uL (ref 0.7–3.1)
Lymphs: 22 %
MCH: 30.7 pg (ref 26.6–33.0)
MCHC: 33.8 g/dL (ref 31.5–35.7)
MCV: 91 fL (ref 79–97)
MONOS ABS: 0.7 10*3/uL (ref 0.1–0.9)
Monocytes: 9 %
NEUTROS ABS: 5.3 10*3/uL (ref 1.4–7.0)
Neutrophils: 68 %
Platelets: 185 10*3/uL (ref 150–450)
RBC: 3.91 x10E6/uL (ref 3.77–5.28)
RDW: 15 % (ref 12.3–15.4)
WBC: 7.8 10*3/uL (ref 3.4–10.8)

## 2018-04-16 MED ORDER — HYDROCHLOROTHIAZIDE 12.5 MG PO CAPS: 12.5000 mg | ORAL_CAPSULE | Freq: Every day | ORAL | 3 refills | Status: DC

## 2018-04-16 NOTE — Patient Instructions (Signed)
Medication Instructions:  START hydrochlorothiazide (MICROZIDE) 12.5 MG [place]  If you need a refill on your cardiac medications before your next appointment, please call your pharmacy.  Labwork: CBC AND UA TODAY HERE IN OUR OFFICE AT [facility]  BMET IN ONE WEEK HERE IN OUR OFFICE AT [facility]  Take the provided lab slips with you to the lab for your blood draw.    You will NOT need to fast   If you have labs (blood work) drawn today and your tests are completely normal, you will receive your results only by: Marland Kitchen MyChart Message (if you have MyChart) OR . A paper copy in the mail If you have any lab test that is abnormal or we need to change your treatment, we will call you to review the results.  Special Instructions: PLEASE PURCHASE AND WEAR COMPRESSION STOCKINGS [place] AND OFF AT BEDTIME. Compression stockings are elastic socks that squeeze the legs. They help to increase blood flow to the legs and to decrease swelling in the legs from fluid retention, and reduce the chance of developing blood clots in the lower legs. SEE PROVIDED PAMPHLET.  Follow-Up: At [facility], you and your health needs are our priority.  As part of our continuing mission to provide you with exceptional heart care, we have created designated Provider Care Teams.  These Care Teams include your primary Cardiologist (physician) and Advanced Practice Providers (APPs -  Physician Assistants and Nurse Practitioners) who all work together to provide you with the care you need, when you need it. . You will need a follow up appointment in 1 MONTH.  You may see  DR [facility] or one of the following Advanced Practice Providers on your designated Care Team:   . Rosaria Ferries, PA-C . Jory Sims, DNP, ANP   Thank you for choosing [facility] at [facility]!!

## 2018-04-16 NOTE — Telephone Encounter (Signed)
Left detailed message as noted for [facility], call back w/any ADDIONAL NEEDS

## 2018-04-16 NOTE — Telephone Encounter (Signed)
° °  Shante from [facility] calling to request next lab draw for BMP be done at their facility. Please call [phone]

## 2018-04-17 NOTE — Telephone Encounter (Signed)
D/w KL she would like cxr to check on pulmonary edema. Order placed and faxed to [email or web address]  she will call if abnormal.-Tee will look for these results, and discuss abn result with DOD as we are off.

## 2018-04-17 NOTE — Telephone Encounter (Signed)
S/W [email or web address]  OK TO DRAW AT THE FACILITY. FAX # GIVEN TO FAX RESULTS AFTER.

## 2018-04-18 ENCOUNTER — Encounter: Payer: Self-pay | Admitting: Internal Medicine

## 2018-04-18 DIAGNOSIS — R079 Chest pain, unspecified: Secondary | ICD-10-CM | POA: Diagnosis not present

## 2018-04-21 ENCOUNTER — Ambulatory Visit: Payer: Medicare Other | Admitting: Cardiology

## 2018-04-23 ENCOUNTER — Non-Acute Institutional Stay: Payer: Medicare Other | Admitting: Internal Medicine

## 2018-04-23 ENCOUNTER — Encounter: Payer: Self-pay | Admitting: Internal Medicine

## 2018-04-23 VITALS — BP 116/60 | HR 76 | Temp 98.1°F | Ht 62.0 in | Wt 149.0 lb

## 2018-04-23 DIAGNOSIS — K219 Gastro-esophageal reflux disease without esophagitis: Secondary | ICD-10-CM

## 2018-04-23 DIAGNOSIS — N3281 Overactive bladder: Secondary | ICD-10-CM | POA: Diagnosis not present

## 2018-04-23 DIAGNOSIS — K582 Mixed irritable bowel syndrome: Secondary | ICD-10-CM

## 2018-04-23 DIAGNOSIS — E034 Atrophy of thyroid (acquired): Secondary | ICD-10-CM | POA: Diagnosis not present

## 2018-04-23 DIAGNOSIS — G3184 Mild cognitive impairment, so stated: Secondary | ICD-10-CM

## 2018-04-23 DIAGNOSIS — R310 Gross hematuria: Secondary | ICD-10-CM

## 2018-04-23 DIAGNOSIS — R110 Nausea: Secondary | ICD-10-CM | POA: Diagnosis not present

## 2018-04-23 DIAGNOSIS — I350 Nonrheumatic aortic (valve) stenosis: Secondary | ICD-10-CM | POA: Diagnosis not present

## 2018-04-23 DIAGNOSIS — I10 Essential (primary) hypertension: Secondary | ICD-10-CM | POA: Diagnosis not present

## 2018-04-23 DIAGNOSIS — R944 Abnormal results of kidney function studies: Secondary | ICD-10-CM | POA: Diagnosis not present

## 2018-04-23 DIAGNOSIS — I480 Paroxysmal atrial fibrillation: Secondary | ICD-10-CM | POA: Diagnosis not present

## 2018-04-23 DIAGNOSIS — D649 Anemia, unspecified: Secondary | ICD-10-CM | POA: Diagnosis not present

## 2018-04-23 LAB — BASIC METABOLIC PANEL
BUN: 11 (ref 4–21)
Creatinine: 0.6 (ref 0.5–1.1)
Glucose: 103
Potassium: 3.9 (ref 3.4–5.3)
Sodium: 143 (ref 137–147)

## 2018-04-23 NOTE — Progress Notes (Signed)
Provider:  Rexene Edison. Mariea Clonts, D.O., C.M.D. Location:   [facility]   Place of Service:   clinic  Previous PCP: Gayland Curry, DO Patient Care Team: Gayland Curry, DO as PCP - General (Geriatric Medicine) Minus Breeding, MD as PCP - Cardiology (Cardiology)  Extended Emergency Contact Information Primary Emergency Contact: King,Marian Address: [address], [place] [address] Johnnette Litter of [place] Phone: [phone] Relation: Daughter Secondary Emergency Contact: Christiana Fuchs [place] of [place] Phone: [phone] Mobile Phone: [phone] Relation: Daughter  Code Status: DNR Goals of Care: Advanced Directive information Advanced Directives 04/23/2018  Does Patient Have a Medical Advance Directive? Yes  Type of Paramedic of [place];Out of facility DNR (pink MOST or yellow form)  Does patient want to make changes to medical advance directive? No - Patient declined  Copy of [place] in Chart? -  Would patient like information on creating a medical advance directive? No - Patient declined  Pre-existing out of facility DNR order (yellow form or pink MOST form) Yellow form placed in chart (order not valid for inpatient use)   Chief Complaint  Patient presents with  . Establish Care    new patient to establish with [facility]    HPI: Patient is a 84 y.o. female seen today to establish with [facility].  Previous PCP was Dr. Tresa Garter, [facility].   She has a h/o htn, aortic stenosis, hypothyroidism, hearing loss, IBS, bladder ca, afib, and recent ED visit for chest pain.  She has a left-sided pleural effusion that was noted, but not felt to be the cause of her pain.  She was started on isosorbide 10mg  po bid.  Serial troponins were negative for MI.  She followed up with Dr. Rosezella [place] office on 10/9 with NP Purcell Nails.  She was noted to have bilateral LE edema right greater than left.  Low dose hctz was  added to her regimen for hypertension and was to have f/u bmp.  Compression hose were recommended.  Repeat CXR was suggested.    She had some hematuria, but thought she may have scratched herself when wiping. Trace hematuria was seen on UA.  She was not anemic.  She does have a little incontinence.  She uses pads.  Says people steal her poise pads.    She also had loose stools now on imodium.  Reports she does well in the daytime.  She gets nauseous at [facility] and they give her a little yellow pill that helps her.  She does not know why she's nauseous.  One night it felt like she was wearing a tight jacket.  Another time, it felt like someone was kneeling in her throat and abdomen.  Feels like she should vomit but never does.  She's lost 4-5 lbs.  She's also been very tired.  Feels weak like her legs cna't hold her.  The nausea has been going on at least a month.    Last night she had the kneading feeling.  She was up until 3:30am.  Memory much worse in the past month.  She heaves a lot.  She's not eating as much.  May just have jello or broth for dinner.  May be a little nauseous early in the morning.  Feels like when her stomach is empty, that's part of it.  Has a h/o getting constipated easily, she reports.  She took some warm prune juice.  She has  a lot more difficulty with bad diarrhea.  She loves cut up fresh fruit.  Milk products also make it worse.  Some chocolate is also affecting her--notes hershey's is a no no.   She's had at least 5 years of loose bms.  She was taking imodium [place].  Has had a few bad accidents.    There's a small place that gets swollen on her right leg over a vein.     Right knee hurts her.  She's been through the shots and therapy.  Cannot take cortisone shots anymore.  When she had therapy, it hurt. She had been using ice on it, but not lately.    Has quite a bit of gas.  Not so much belching or burping.  Feels like she has some indigestion, but it never comes all the way  up.    Past Medical History:  Diagnosis Date  . Aortic stenosis   . Atrial fibrillation, new onset ([place])   . Bladder tumor   . CARCINOMA, BLADDER, TRANSITIONAL CELL 10/24/2007  . GERD (gastroesophageal reflux disease)   . Heart murmur   . HYPERTENSION 01/07/2007  . HYPOTHYROIDISM 01/07/2007  . IBS 05/03/2008  . MIXED HEARING LOSS BILATERAL 09/28/2009  . OSTEOARTHRITIS, KNEE 04/25/2007  . UNSPECIFIED ARTHROPATHY MULTIPLE SITES 10/26/2009   Past Surgical History:  Procedure Laterality Date  . BELPHAROPTOSIS REPAIR    . BLADDER TUMOR EXCISION    . BUNIONECTOMY     bilateral  . CATARACT EXTRACTION     bilateral  . TONSILLECTOMY AND ADENOIDECTOMY     x2    Social History   Socioeconomic History  . Marital status: Widowed    Spouse name: Not on file  . Number of children: 3  . Years of education: Not on file  . Highest education level: Not on file  Occupational History  . Occupation: retired    Fish farm manager: RETIRED  Social Needs  . Financial resource strain: Not on file  . Food insecurity:    Worry: Not on file    Inability: Not on file  . Transportation needs:    Medical: Not on file    Non-medical: Not on file  Tobacco Use  . Smoking status: Former Smoker    Packs/day: 0.50    Years: 20.00    Pack years: 10.00    Types: Cigarettes    Last attempt to quit: 09/05/1973    Years since quitting: 44.6  . Smokeless tobacco: Never Used  Substance and Sexual Activity  . Alcohol use: Yes    Comment: ocass  . Drug use: No  . Sexual activity: Not Currently  Lifestyle  . Physical activity:    Days per week: 0 days    Minutes per session: 0 min  . Stress: Not on file  Relationships  . Social connections:    Talks on phone: Not on file    Gets together: Not on file    Attends religious service: Not on file    Active member of club or organization: Not on file    Attends meetings of clubs or organizations: Not on file    Relationship status: Not on file  Other Topics  Concern  . Not on file  Social History Narrative   Tobacco use, amount per day now: NONE   Past tobacco use, amount per day: 1/2 PACK [place]   How many years did you use tobacco:  7-8 YEARS   Alcohol use (drinks per week): NONE NOW ( OR  RARELY)/ IN THE PAST 1-2 PER MONTH   Diet:   Do you drink/eat things with caffeine: YES, ICE TEA, CHOCOLATE   Marital status:     WIDOWED                             What year were you married? 1950   Do you live in a house, apartment, assisted living, condo, trailer, etc.? [place]   Is it one or more stories? LIVE ON THE FIRST FLOOR   How many persons live in your home?   Do you have pets in your home?( please list) NO   Current or past profession:  TEACHER/ NON PROFIT EXECUTIVE DIRECTOR   Do you exercise?          NO                         Type and how often?   Do you have a living will? YES   Do you have a DNR form?                                   If not, do you want to discuss one?   Do you have signed POA/HPOA forms?     YES                   If so, please bring to you appointment    reports that she quit smoking about 44 years ago. Her smoking use included cigarettes. She has a 10.00 pack-year smoking history. She has never used smokeless tobacco. She reports that she drinks alcohol. She reports that she does not use drugs.  Functional Status Survey:    Family History  Problem Relation Age of Onset  . Stomach cancer Mother   . Arthritis Mother   . Leukemia Father   . Kidney failure Father   . Anuerysm Brother        brain  . ALS Sister   . Stomach cancer Maternal Aunt   . Colon cancer Daughter   . Esophageal cancer Neg Hx   . Rectal cancer Neg Hx     Health Maintenance  Topic Date Due  . TETANUS/TDAP  03/31/1940  . DEXA SCAN  03/31/1986  . INFLUENZA VACCINE  02/06/2018  . PNA vac Low Risk Adult  Completed    Allergies  Allergen Reactions  . Penicillins Diarrhea    Has patient had a PCN reaction causing immediate rash,  facial/tongue/throat swelling, SOB or lightheadedness with hypotension: YES Has patient had a PCN reaction causing severe rash involving mucus membranes or skin necrosis:NO Has patient had a PCN reaction that required hospitalization NO Has patient had a PCN reaction occurring within the last 10 years: NO If all of the above answers are "NO", then may proceed with Cephalosporin use.  . Ace Inhibitors     REACTION: cough  . Aspirin   . Hydrocodone     REACTION: Hallucinations---minor  . Nsaids     Outpatient Encounter Medications as of 04/23/2018  Medication Sig  . acetaminophen (TYLENOL) 325 MG tablet Take 2 tablets (650 mg total) by mouth 3 (three) times Auerbach.  . famotidine (PEPCID) 20 MG tablet TAKE 1 TABLET TWICE Zuidema.  . isosorbide dinitrate (ISORDIL) 10 MG tablet Take 1 tablet (10 mg total) by mouth 2 (two) times  Maslowski.  . levothyroxine (SYNTHROID, LEVOTHROID) 75 MCG tablet Take 0.5 tablets (37.5 mcg total) by mouth Mossberg before breakfast.  . Loperamide HCl 2 MG CHEW 1 tablet every 6 hours as needed for diarrhea.  May take as a preventive measure prior to visits out of the facility  . Multiple Vitamin (MULTIVITAMIN) tablet Take 2 tablets by mouth Novelo.   . ondansetron (ZOFRAN) 8 MG tablet Take 1 tablet (8 mg total) by mouth every 8 (eight) hours as needed for nausea or vomiting.  . Psyllium 400 MG CAPS Take 400 mg by mouth 2 (two) times Hagemann. WITH 4-6 OZ OF WATER  . atorvastatin (LIPITOR) 20 MG tablet Take 1 tablet (20 mg total) by mouth Delker.  . capsaicin (ZOSTRIX) 0.025 % cream Apply topically as needed.  . carvedilol (COREG) 12.5 MG tablet Take 0.5 tablets (6.25 mg total) by mouth 2 (two) times Dreisbach.  . Glucosamine-Chondroitin 750-600 MG TABS Take 1 tablet by mouth 3 (three) times Sheeley.  . hydrochlorothiazide (MICROZIDE) 12.5 MG capsule Take 1 capsule (12.5 mg total) by mouth Botz.   No facility-administered encounter medications on file as of 04/23/2018.     Review of  Systems  Constitutional: Positive for malaise/fatigue and weight loss. Negative for chills and fever.  HENT: Positive for hearing loss. Negative for congestion.   Eyes: Negative for blurred vision.       Glasses  Respiratory: Negative for cough and shortness of breath.   Cardiovascular: Positive for leg swelling. Negative for chest pain and palpitations.  Gastrointestinal: Positive for diarrhea, heartburn and nausea. Negative for abdominal pain, blood in stool, constipation, melena and vomiting.  Genitourinary: Positive for hematuria and urgency.  Musculoskeletal: Positive for joint pain. Negative for falls.  Skin: Negative for itching and rash.  Neurological: Negative for dizziness and loss of consciousness.  Endo/Heme/Allergies: Bruises/bleeds easily.  Psychiatric/Behavioral: Positive for memory loss. Negative for depression. The patient is not nervous/anxious and does not have insomnia.     Vitals:   04/23/18 1439  BP: 116/60  Pulse: 76  Temp: 98.1 F (36.7 C)  TempSrc: Oral  SpO2: 93%  Weight: 149 lb (67.6 kg)  Height: 5\' 2"  (1.575 m)   Body mass index is 27.25 kg/m. Physical Exam  Constitutional: She is oriented to person, place, and time. She appears well-developed. No distress.  HENT:  Head: Normocephalic and atraumatic.  Right Ear: External ear normal.  Left Ear: External ear normal.  Nose: Nose normal.  Mouth/Throat: Oropharynx is clear and moist. No oropharyngeal exudate.  Eyes: Pupils are equal, round, and reactive to light. Conjunctivae and EOM are normal.  glasses  Neck: Neck supple. No JVD present. No tracheal deviation present. No thyromegaly present.  Cardiovascular: Intact distal pulses.  Murmur heard. irreg irreg  Pulmonary/Chest: Effort normal and breath sounds normal. No respiratory distress.  Abdominal: Soft. Bowel sounds are normal. She exhibits no distension and no mass. There is tenderness. There is no guarding.  Slight generalized tenderness    Lymphadenopathy:    She has no cervical adenopathy.  Neurological: She is alert and oriented to person, place, and time. No cranial nerve deficit.  But short term memory loss--repeated questions more than once  Skin: Skin is warm and dry.  Psychiatric: She has a normal mood and affect.    Labs reviewed: Basic Metabolic Panel: Recent Labs    09/26/17 1232 04/11/18 1430  NA 143 141  K 4.0 3.8  CL 107 108  CO2 26 26  GLUCOSE 98  117*  BUN 11 13  CREATININE 0.67 0.84  CALCIUM 9.5 9.2   Liver Function Tests: Recent Labs    04/11/18 1430  AST 23  ALT 15  ALKPHOS 51  BILITOT 1.8*  PROT 5.7*  ALBUMIN 3.7   Recent Labs    04/11/18 1430  LIPASE 30   No results for input(s): AMMONIA in the last 8760 hours. CBC: Recent Labs    09/26/17 1232 04/11/18 1430 04/16/18 0928  WBC 9.3 7.5 7.8  NEUTROABS  --  4.5 5.3  HGB 11.5* 11.2* 12.0  HCT 36.9 37.3 35.5  MCV 94.4 99.5 91  PLT 177 166 185   Cardiac Enzymes: No results for input(s): CKTOTAL, CKMB, CKMBINDEX, TROPONINI in the last 8760 hours. BNP: Invalid input(s): POCBNP No results found for: HGBA1C Lab Results  Component Value Date   TSH 0.93 04/11/2017   Assessment/Plan 1. Nausea - H. pylori breath test; Future at [facility] to sort out gerd, nausea and abd pain -if negative, may need CT abd pelvis with contrast  2. Irritable bowel syndrome with both constipation and diarrhea -recently with more diarrhea and not managed well--give imodium before she goes out -see #1  3. Gastroesophageal reflux disease without esophagitis Schedule zofran scheduled 8mg  with evening meds Imodium when going out h pylori breath test on 10/21  4. Aortic valve stenosis, etiology of cardiac valve disease unspecified -stable, asymptomatic per pt, but she's also pretty immobile--mostly using her power chair for any distances  5. Paroxysmal atrial fibrillation (HCC) -rate controlled at this time, cont coreg, not on anticoagulation due  to prior gi bleeding  6. Hypothyroidism due to acquired atrophy of thyroid -cont current levothyroxine  7. Essential hypertension -bp at goal w/o dizziness--cont same medications  8. Mild cognitive impairment with memory loss Getting worse, has had prior stroke  9. Gross hematuria -some rbc noted in UA done due to this, but pt has had no more symptoms of this -seems she had a scratch in her vaginal area that led to outright bleeding in that area   10.  Overactive bladder Try myrbetriq 25mg .    Her daughter was going to make pt and appt with Dr. Ellie Lunch for her eye exam here rather than taking her out as has been done in the past  Labs/tests ordered:  h pylori breath test on 10/21  F/u 6 wks with me  Chiara Coltrin L. Pearlena Ow, D.O. [facility] [address], [place] [address] Cell Phone (Mon-Fri 8am-5pm):  [phone] On Call:  [phone] & follow prompts after 5pm & weekends Office Phone:  [phone] Office Fax:  [phone]

## 2018-04-28 ENCOUNTER — Other Ambulatory Visit: Payer: Medicare Other

## 2018-04-28 DIAGNOSIS — R110 Nausea: Secondary | ICD-10-CM | POA: Diagnosis not present

## 2018-04-29 ENCOUNTER — Other Ambulatory Visit: Payer: Self-pay | Admitting: Internal Medicine

## 2018-04-29 DIAGNOSIS — R319 Hematuria, unspecified: Secondary | ICD-10-CM

## 2018-04-29 DIAGNOSIS — R194 Change in bowel habit: Secondary | ICD-10-CM

## 2018-04-29 DIAGNOSIS — R109 Unspecified abdominal pain: Secondary | ICD-10-CM

## 2018-04-29 DIAGNOSIS — R110 Nausea: Secondary | ICD-10-CM

## 2018-04-29 LAB — H. PYLORI BREATH TEST: H. pylori Breath Test: NOT DETECTED

## 2018-05-01 ENCOUNTER — Encounter: Payer: Self-pay | Admitting: *Deleted

## 2018-05-02 ENCOUNTER — Ambulatory Visit
Admission: RE | Admit: 2018-05-02 | Discharge: 2018-05-02 | Disposition: A | Discharge status: Home / Self Care | Payer: Medicare Other | Source: Ambulatory Visit | Attending: Internal Medicine | Admitting: Internal Medicine

## 2018-05-02 ENCOUNTER — Other Ambulatory Visit: Payer: Self-pay | Admitting: Internal Medicine

## 2018-05-02 DIAGNOSIS — R0789 Other chest pain: Secondary | ICD-10-CM | POA: Insufficient documentation

## 2018-05-02 DIAGNOSIS — R110 Nausea: Secondary | ICD-10-CM

## 2018-05-02 DIAGNOSIS — R194 Change in bowel habit: Secondary | ICD-10-CM

## 2018-05-02 DIAGNOSIS — R109 Unspecified abdominal pain: Secondary | ICD-10-CM

## 2018-05-02 DIAGNOSIS — R319 Hematuria, unspecified: Secondary | ICD-10-CM

## 2018-05-02 DIAGNOSIS — K5730 Diverticulosis of large intestine without perforation or abscess without bleeding: Secondary | ICD-10-CM | POA: Diagnosis not present

## 2018-05-02 MED ORDER — IOPAMIDOL (ISOVUE-300) INJECTION 61%
75.0000 mL | Freq: Once | INTRAVENOUS | Status: AC | PRN
  Administered 2018-05-02: 75 mL via INTRAVENOUS

## 2018-05-05 ENCOUNTER — Encounter: Payer: Self-pay | Admitting: *Deleted

## 2018-05-08 ENCOUNTER — Other Ambulatory Visit: Payer: Self-pay | Admitting: Internal Medicine

## 2018-05-08 ENCOUNTER — Telehealth: Payer: Self-pay | Admitting: *Deleted

## 2018-05-08 ENCOUNTER — Ambulatory Visit: Payer: Medicare Other | Admitting: Cardiology

## 2018-05-08 DIAGNOSIS — Z23 Encounter for immunization: Secondary | ICD-10-CM | POA: Diagnosis not present

## 2018-05-08 DIAGNOSIS — K219 Gastro-esophageal reflux disease without esophagitis: Secondary | ICD-10-CM

## 2018-05-08 DIAGNOSIS — R194 Change in bowel habit: Secondary | ICD-10-CM

## 2018-05-08 DIAGNOSIS — K582 Mixed irritable bowel syndrome: Secondary | ICD-10-CM

## 2018-05-08 DIAGNOSIS — R109 Unspecified abdominal pain: Secondary | ICD-10-CM

## 2018-05-08 NOTE — Telephone Encounter (Signed)
Spoke with Susan [name].  Reviewed the findings on her mother's CT with her in more detail.  She is very clear that she does not want her mother to have a colonoscopy at 85 to evaluate further for possible malignancy.  She is interested in having gastroenterology see her mother and get a better look at the CT and help [place] to manage her mom's nausea and diarrhea symptoms.  There is a family history of Crohn's in two of Susan [name]'s daughters.  We also reviewed the fluid in the base of the lung.  Pt has no s/s of pneumonia.    She also has a h/o bladder cancer with prior resection and recent hematuria.  The bladder imaging was not the clearest on the Ct due to not filling with contrast.   There was a diverticulum present.  Susan [name] also would not want her mom to go thru cystoscopy, but possibly a bladder ultrasound to better delineate the bladder pathology since that would be noninvasive.    Goals are comfort-based.  They are seeking information purely to be sure we can make the best decisions to help manage Susan [name]'s symptoms.    We will set up the GI referral to get more information as the next step.

## 2018-05-08 NOTE — Telephone Encounter (Signed)
[name], daughter called and stated that she would like to talk with you directly regarding patient's CT Results. Stated that she spoke with the [place] but she needs Clarification and Next steps. Would like for you to call her on her cell [phone]

## 2018-05-12 ENCOUNTER — Encounter: Payer: Self-pay | Admitting: Nurse Practitioner

## 2018-05-12 ENCOUNTER — Ambulatory Visit (INDEPENDENT_AMBULATORY_CARE_PROVIDER_SITE_OTHER): Payer: Medicare Other | Admitting: Nurse Practitioner

## 2018-05-12 VITALS — BP 100/60 | HR 68 | Ht 62.0 in | Wt 152.0 lb

## 2018-05-12 DIAGNOSIS — K219 Gastro-esophageal reflux disease without esophagitis: Secondary | ICD-10-CM | POA: Diagnosis not present

## 2018-05-12 DIAGNOSIS — R935 Abnormal findings on diagnostic imaging of other abdominal regions, including retroperitoneum: Secondary | ICD-10-CM | POA: Diagnosis not present

## 2018-05-12 DIAGNOSIS — R110 Nausea: Secondary | ICD-10-CM

## 2018-05-12 NOTE — Patient Instructions (Signed)
  Change your zofran to as needed.    I appreciate the opportunity to care for you. Tye Savoy, NP-C

## 2018-05-12 NOTE — Progress Notes (Signed)
Chief Complaint:    Abnormal CT scan.  IMPRESSION and PLAN:    51. 84 year old female with abnormal colon and rectum on recent CT scan done.  Asymmetric muscular hypertrophy of sigmoid colon.  Slight asymmetrical circumferential wall thickening of rectum.  - I believe these are incidental findings but of course underlying neoplasm is always possible and daughter understands this.  Given her age and lack of symptoms daughter does not want to proceed with any invasive work-up. This seems reasonable, especially since patient has no unexplained weight loss, blood in stool, bowel changes or anemia.   2. Nausea.  This has resolved though patient is apparently getting a dose of Zofran every day. -I have recommended that staff at [facility] hold off on giving her Zofran every day and instead use on as needed basis.  If nausea persists then I am happy to see her back.   3. GERD.  Recent increase in nocturnal symptoms. Resolved after she stopped eating 3 fruits cups a day.  She takes Pepcid.   4. Chronic diarrhea, manages nicely with imodium but never requires more than one dose a day.   HPI:     Patient is a 84 year old female known to Dr. Ardis Hughs.  Past medical history includes but is not limited to PAF, hypertension, thyroid disease . She has been followed here for IBS and history of GERD. Her last colonoscopy was in 2013.  It was done for evaluation of diarrhea and [place] of colon cancer in daughter,12-15 small polyps were removed, mild diverticulosis of the sigmoid and descending colon found, external hemorrhoids were found.  Labs were all adenomas without high-grade dysplasia.  No follow-up colonoscopy recommended due to age.  Patient recently seen in the ED for evaluation of nausea and what daughter describes as a funny feeling in her chest.  Symptoms started in September.  She also described problems with acid reflux, especially at night.  She had been eating 3 cups of fruit a day and after  omitting this her reflux significantly improved .  Because of the chest discomfort and nausea patient was seen in the ED 04/11/18.   Data reviewed ED visit Normal white count, hemoglobin 11.2 Total bilirubin 1.8, liver tests  otherwise normal Renal function normalHepatobiliary: 1 cm low-density structure medial right lobe liver adjacent to the common bile duct may represent a liver cyst or focal projection of common bile duct. No worrisome hepatic lesion.  CTAP  IMPRESSION: 1. Slightly asymmetric circumferential rectal wall thickening. Cannot exclude mass at this level. 2. Sigmoid colon diverticulosis. No definitive evidence of diverticulitis. 3. Asymmetric muscular hypertrophy of the sigmoid colon (which may be related to diverticulosis) and areas of under distension of the hepatic flexure and sigmoid flexure of the colon (which may be related to simple peristalsis) limit evaluation for detection of possibly of underlying mass at these levels. 4. Small hiatal hernia. 5. Small to moderate size left-sided pleural effusion. Left base consolidation may represent atelectasis although infiltrate not excluded. Right base very small pleural effusion and right base atelectasis. 6. Right upper pole 5.5 cm renal cyst. Bilateral parapelvic cysts. No hydronephrosis. 7. 2.5 cm left-sided bladder diverticulum. Evaluation of urinary bladder limited by lack of contrast filled views. With patient's history, if hematuria is present, further investigation may be considered. 8. Aortic Atherosclerosis (ICD10-I70.0). Abdominal aorta ectasia with maximal transverse dimension of 2.4 cm. Prominent atherosclerotic changes aortic branch vessels without large vessel occlusion. 9. Contracted gallbladder without gallstones or CT  evidence of pericholecystic inflammation. If gallbladder abnormality were of high clinical concern than ultrasound may be considered. 10. Scoliosis lumbar spine with superimposed  degenerative changes. Hip joint degenerative changes.    Following ED visit patient was seen by PCP.  She was still having problems with nausea so Zofran was prescribed.  Per daughter patient had abdominal tenderness on exam so PCP ordered H. pylori which was negative.  She also ordered a CT scan which showed some incidental findings in the left colon and rectum.  We have been asked to address these normalities on CT scan.  Patient gives a history of chronic diarrhea.  Many days she takes 1 Imodium and this prevents multiple loose stools which sometimes result in incontinence.  Sometimes she becomes constipated and drinks prune juice, then back to loose stools.  Her bowel habits have not changed at all.  She has no blood in her stool.  No abdominal pain.  No unexpected weight loss.   Review of systems:     No chest pain, no SOB, no fevers, no urinary sx   Past Medical History:  Diagnosis Date  . Aortic stenosis   . Atrial fibrillation, new onset ([place])   . Bladder tumor   . CARCINOMA, BLADDER, TRANSITIONAL CELL 10/24/2007  . Cerebrovascular disease, unspecified   . Chest tightness    with nausea  . Chronic maxillary sinusitis   . Dizziness and giddiness   . GERD (gastroesophageal reflux disease)   . Heart murmur   . HYPERTENSION 01/07/2007  . HYPOTHYROIDISM 01/07/2007  . IBS 05/03/2008  . MIXED HEARING LOSS BILATERAL 09/28/2009  . Nausea   . OSTEOARTHRITIS, KNEE 04/25/2007  . UNSPECIFIED ARTHROPATHY MULTIPLE SITES 10/26/2009    Patient's surgical history, family medical history, social history, medications and allergies were all reviewed in Epic   Creatinine clearance cannot be calculated (Patient's most recent lab result is older than the maximum 21 days allowed.)  Current Outpatient Medications  Medication Sig Dispense Refill  . acetaminophen (TYLENOL) 325 MG tablet Take 2 tablets (650 mg total) by mouth 3 (three) times Monsanto.    Marland Kitchen atorvastatin (LIPITOR) 20 MG tablet Take 1 tablet  (20 mg total) by mouth Mannor. 90 tablet 4  . capsaicin (ZOSTRIX) 0.025 % cream Apply topically as needed.    . carvedilol (COREG) 12.5 MG tablet Take 0.5 tablets (6.25 mg total) by mouth 2 (two) times Ferdig. 180 tablet 4  . famotidine (PEPCID) 20 MG tablet TAKE 1 TABLET TWICE Criado. 60 tablet 0  . Glucosamine-Chondroitin 750-600 MG TABS Take 1 tablet by mouth 3 (three) times Wirsing.    . hydrochlorothiazide (MICROZIDE) 12.5 MG capsule Take 1 capsule (12.5 mg total) by mouth Dann. 90 capsule 3  . isosorbide dinitrate (ISORDIL) 10 MG tablet Take 1 tablet (10 mg total) by mouth 2 (two) times Gabler. 60 tablet 0  . levothyroxine (SYNTHROID, LEVOTHROID) 75 MCG tablet Take 0.5 tablets (37.5 mcg total) by mouth Busta before breakfast. 90 tablet 4  . Loperamide HCl 2 MG CHEW 1 tablet every 6 hours as needed for diarrhea.  May take as a preventive measure prior to visits out of the facility 30 tablet 3  . Multiple Vitamin (MULTIVITAMIN) tablet Take 2 tablets by mouth Brents.     Marland Kitchen MYRBETRIQ 25 MG TB24 tablet Take 1 tablet by mouth Auer.    . ondansetron (ZOFRAN) 8 MG tablet Take 1 tablet (8 mg total) by mouth every 8 (eight) hours as needed for nausea or vomiting.  20 tablet 0  . Psyllium 400 MG CAPS Take 400 mg by mouth 2 (two) times Meissner. WITH 4-6 OZ OF WATER     No current facility-administered medications for this visit.     Physical Exam:     BP 100/60   Pulse 68   Ht 5\' 2"  (1.575 m)   Wt 152 lb (68.9 kg)   BMI 27.80 kg/m    GENERAL:  Pleasant female in NAD PSYCH: : Cooperative, normal affect EENT:  conjunctiva pink, mucous membranes moist, neck supple without masses CARDIAC:  RRR, +murmur , no peripheral edema PULM: Normal respiratory effort, lungs CTA bilaterally, no wheezing ABDOMEN:  Limited exam in wheelchair. Abd nondistended, soft, nontender,  normal bowel sounds SKIN:  turgor, no lesions seen Musculoskeletal:  Normal muscle tone, normal strength NEURO: Alert and oriented x 3, no  focal neurologic deficits   Tye Savoy , NP 05/12/2018, 3:04 PM   Cc:  Hollace Kinnier, MD

## 2018-05-13 NOTE — Progress Notes (Signed)
I agree with the above note, plan 

## 2018-05-19 NOTE — Progress Notes (Signed)
HPI The patient presents for follow up of AS and PAF.  She returns for follow up.  Since I saw her she was in the ED with chest pain which was not thought to be angina.  I reviewed these records for this visit.   She did have pleural effusions that was managed medically.  Since that time she is had some nausea and diarrhea.  I do note a CT of her abdomen that demonstrated aortic atherosclerosis.  There was gallstones.  Again there was noted to be moderate sized left pleural effusion and left basilar consolidation with a small right-sided effusion.  She returns for follow-up.  She lives at [facility]  She gets around in a motorized scooter.  She is not having any complaint other than a little pain in her right pretibial area.  She denies any palpitations, presyncope or syncope.  She has had no new chest pressure since her ER visit.  She was started on isosorbide at that meeting.  She has had no palpitations, presyncope or syncope.  He is bothered by diarrhea.   Allergies  Allergen Reactions  . Penicillins Diarrhea    Has patient had a PCN reaction causing immediate rash, facial/tongue/throat swelling, SOB or lightheadedness with hypotension: YES Has patient had a PCN reaction causing severe rash involving mucus membranes or skin necrosis:NO Has patient had a PCN reaction that required hospitalization NO Has patient had a PCN reaction occurring within the last 10 years: NO If all of the above answers are "NO", then may proceed with Cephalosporin use.  . Ace Inhibitors     REACTION: cough  . Aspirin   . Hydrocodone     REACTION: Hallucinations---minor  . Nsaids     Current Outpatient Medications  Medication Sig Dispense Refill  . acetaminophen (TYLENOL) 325 MG tablet Take 2 tablets (650 mg total) by mouth 3 (three) times Nass.    Marland Kitchen atorvastatin (LIPITOR) 20 MG tablet Take 1 tablet (20 mg total) by mouth Mazzocco. 90 tablet 4  . capsaicin (ZOSTRIX) 0.025 % cream Apply topically as  needed.    . carvedilol (COREG) 12.5 MG tablet Take 0.5 tablets (6.25 mg total) by mouth 2 (two) times Thoen. 180 tablet 4  . famotidine (PEPCID) 20 MG tablet TAKE 1 TABLET TWICE Salais. 60 tablet 0  . Glucosamine-Chondroitin 750-600 MG TABS Take 1 tablet by mouth 3 (three) times Huffine.    . hydrochlorothiazide (MICROZIDE) 12.5 MG capsule Take 1 capsule (12.5 mg total) by mouth Jewel. 90 capsule 3  . isosorbide dinitrate (ISORDIL) 10 MG tablet Take 1 tablet (10 mg total) by mouth 2 (two) times Moser. 60 tablet 0  . levothyroxine (SYNTHROID, LEVOTHROID) 75 MCG tablet Take 0.5 tablets (37.5 mcg total) by mouth Buck before breakfast. 90 tablet 4  . Loperamide HCl 2 MG CHEW 1 tablet every 6 hours as needed for diarrhea.  May take as a preventive measure prior to visits out of the facility 30 tablet 3  . Multiple Vitamin (MULTIVITAMIN) tablet Take 2 tablets by mouth Faux.     Marland Kitchen MYRBETRIQ 25 MG TB24 tablet Take 1 tablet by mouth Bouler.    . ondansetron (ZOFRAN) 8 MG tablet Take 1 tablet (8 mg total) by mouth every 8 (eight) hours as needed for nausea or vomiting. 20 tablet 0  . Psyllium 400 MG CAPS Take 400 mg by mouth 2 (two) times Vasconcelos. WITH 4-6 OZ OF WATER     No current facility-administered medications for this  visit.     Past Medical History:  Diagnosis Date  . Aortic stenosis   . Atrial fibrillation, new onset ([place])   . Bladder tumor   . CARCINOMA, BLADDER, TRANSITIONAL CELL 10/24/2007  . Cerebrovascular disease, unspecified   . Chest tightness    with nausea  . Chronic maxillary sinusitis   . Dizziness and giddiness   . GERD (gastroesophageal reflux disease)   . Heart murmur   . HYPERTENSION 01/07/2007  . HYPOTHYROIDISM 01/07/2007  . IBS 05/03/2008  . MIXED HEARING LOSS BILATERAL 09/28/2009  . Nausea   . OSTEOARTHRITIS, KNEE 04/25/2007  . UNSPECIFIED ARTHROPATHY MULTIPLE SITES 10/26/2009    Past Surgical History:  Procedure Laterality Date  . BELPHAROPTOSIS REPAIR    . BLADDER  TUMOR EXCISION    . BUNIONECTOMY     bilateral  . CATARACT EXTRACTION     bilateral  . TONSILLECTOMY AND ADENOIDECTOMY     x2    ROS:   As stated in the HPI and negative for all other systems.   PHYSICAL EXAM BP 110/70   Pulse 83   Ht 5\' 4"  (1.626 m)   Wt 149 lb (67.6 kg)   SpO2 98%   BMI 25.58 kg/m   GEN:  No distress.   Slightly frail NECK:  No jugular venous distention at 90 degrees, waveform within normal limits, carotid upstroke brisk and symmetric, no bruits, no thyromegaly LUNGS:  Clear to auscultation bilaterally BACK:  No CVA tenderness CHEST:  Unremarkable HEART:  S1 and S2 within normal limits, no S3, no S4, no clicks, no rubs, 3 out of 6 apical systolic murmur radiating at the aortic outflow tract, no diastolic murmurs ABD:  Positive bowel sounds normal in frequency in pitch, no bruits, no rebound, no guarding, unable to assess midline mass or bruit with the patient seated. EXT:  2 plus pulses throughout, trace edema, no cyanosis no clubbing    EKG:   04/16/2018 sinus rhythm, borderline interventricular conduction delay, left axis deviation, no acute ST-T wave changes.   ASSESSMENT AND PLAN   Aortic stenosis - This was moderate on echo in 2017.  We are managing this conservatively.  No change in therapy.  She understands salt restriction.  She is not having any acute symptoms.   Pleural effusion- I reviewed chest x-rays and CT.  At this point I do not think she is symptomatic with this and I would manage this conservatively.  I do not think a change in therapy is indicated.  HTN - The blood pressure is at target.  No change in therapy.   PAF - [name] has a CHA2DS2 - VASc score of 4 with a risk of stroke of 4%.  She is had some dizziness but no obvious symptomatic paroxysms.  She is in regular rhythm and was in sinus rhythm on her last EKG. We are avoiding anticoagulation.  She has been high risk for falls.

## 2018-05-20 ENCOUNTER — Ambulatory Visit (INDEPENDENT_AMBULATORY_CARE_PROVIDER_SITE_OTHER): Payer: Medicare Other | Admitting: Cardiology

## 2018-05-20 ENCOUNTER — Encounter: Payer: Self-pay | Admitting: Cardiology

## 2018-05-20 VITALS — BP 110/70 | HR 83 | Ht 64.0 in | Wt 149.0 lb

## 2018-05-20 DIAGNOSIS — I480 Paroxysmal atrial fibrillation: Secondary | ICD-10-CM

## 2018-05-20 DIAGNOSIS — J90 Pleural effusion, not elsewhere classified: Secondary | ICD-10-CM | POA: Diagnosis not present

## 2018-05-20 DIAGNOSIS — I10 Essential (primary) hypertension: Secondary | ICD-10-CM | POA: Diagnosis not present

## 2018-05-20 DIAGNOSIS — R079 Chest pain, unspecified: Secondary | ICD-10-CM

## 2018-05-20 NOTE — Patient Instructions (Signed)

## 2018-05-26 ENCOUNTER — Other Ambulatory Visit: Payer: Self-pay

## 2018-06-04 ENCOUNTER — Encounter: Payer: Self-pay | Admitting: Internal Medicine

## 2018-06-04 ENCOUNTER — Non-Acute Institutional Stay: Payer: Medicare Other | Admitting: Internal Medicine

## 2018-06-04 VITALS — BP 112/60 | HR 77 | Temp 97.5°F | Ht 64.0 in | Wt 150.0 lb

## 2018-06-04 DIAGNOSIS — R935 Abnormal findings on diagnostic imaging of other abdominal regions, including retroperitoneum: Secondary | ICD-10-CM | POA: Diagnosis not present

## 2018-06-04 DIAGNOSIS — H532 Diplopia: Secondary | ICD-10-CM | POA: Diagnosis not present

## 2018-06-04 DIAGNOSIS — R110 Nausea: Secondary | ICD-10-CM | POA: Diagnosis not present

## 2018-06-04 DIAGNOSIS — I480 Paroxysmal atrial fibrillation: Secondary | ICD-10-CM

## 2018-06-04 DIAGNOSIS — N3281 Overactive bladder: Secondary | ICD-10-CM | POA: Diagnosis not present

## 2018-06-04 DIAGNOSIS — K582 Mixed irritable bowel syndrome: Secondary | ICD-10-CM | POA: Diagnosis not present

## 2018-06-04 DIAGNOSIS — K219 Gastro-esophageal reflux disease without esophagitis: Secondary | ICD-10-CM | POA: Diagnosis not present

## 2018-06-04 DIAGNOSIS — I10 Essential (primary) hypertension: Secondary | ICD-10-CM

## 2018-06-04 DIAGNOSIS — J90 Pleural effusion, not elsewhere classified: Secondary | ICD-10-CM

## 2018-06-04 DIAGNOSIS — I350 Nonrheumatic aortic (valve) stenosis: Secondary | ICD-10-CM

## 2018-06-04 NOTE — Progress Notes (Signed)
Location:  Occupational psychologist of Service:  Clinic (12)  Provider: Crystalmarie Yasin L. Mariea Clonts, D.O., C.M.D.  Code Status: DNR Goals of Care:  Advanced Directives 06/04/2018  Does Patient Have a Medical Advance Directive? Yes  Type of Paramedic of [place];Out of facility DNR (pink MOST or yellow form)  Does patient want to make changes to medical advance directive? No - Patient declined  Copy of [place] in Chart? Yes - validated most recent copy scanned in chart (See row information)  Would patient like information on creating a medical advance directive? No - Patient declined  Pre-existing out of facility DNR order (yellow form or pink MOST form) Yellow form placed in chart (order not valid for inpatient use)     Chief Complaint  Patient presents with  . Medical Management of Chronic Issues    6 week follow-up    HPI: Patient is a 84 y.o. female seen today for medical management of chronic diseases.    She was eating three cups of fresh fruit per day and has not been nauseous since.  The squeezing/kneading of her chest has gone away, too.  She's also had less diarrhea lately.   There have been a few episodes of diarrhea every few days and the imodium does take care of it for that day.  Wears a poise pad only.  Does not wear depends.    She saw the cardiology PA and then saw Dr. Percival Spanish.  No change in tx was needed for the pleural effusion.  She is to return in a year.  They have opted to avoid anticoagulation for her at 84 yo with her fall risk.    Memory is getting worse.  She cannot remember well.  Says it was never particularly good anyway.  Knows it's not what it used to be.  She called her daughter one day and told her she didn't know who she was or where she was.  She looked at her calendar clock.  As she started to move around her apt, it all started to come back.  She'd had a nap and had been really dead asleep.   Memory has gotten worse morseo in the past 6 mos.    She has only had a broken foot, but no truly osteoporotic fractures.   She does use her walker in her apt and power chair outside.  She has never had a bone density or taken medication for her bones.  She's now 84 yo and just got over having GERD symptoms from fruit.    Weight stable at 150lbs.  Past Medical History:  Diagnosis Date  . Aortic stenosis   . Atrial fibrillation, new onset ([place])   . Bladder tumor   . CARCINOMA, BLADDER, TRANSITIONAL CELL 10/24/2007  . Cerebrovascular disease, unspecified   . Chest tightness    with nausea  . Chronic maxillary sinusitis   . Dizziness and giddiness   . GERD (gastroesophageal reflux disease)   . Heart murmur   . HYPERTENSION 01/07/2007  . HYPOTHYROIDISM 01/07/2007  . IBS 05/03/2008  . MIXED HEARING LOSS BILATERAL 09/28/2009  . Nausea   . OSTEOARTHRITIS, KNEE 04/25/2007  . UNSPECIFIED ARTHROPATHY MULTIPLE SITES 10/26/2009    Past Surgical History:  Procedure Laterality Date  . BELPHAROPTOSIS REPAIR    . BLADDER TUMOR EXCISION    . BUNIONECTOMY     bilateral  . CATARACT EXTRACTION     bilateral  . TONSILLECTOMY  AND ADENOIDECTOMY     x2    Allergies  Allergen Reactions  . Penicillins Diarrhea    Has patient had a PCN reaction causing immediate rash, facial/tongue/throat swelling, SOB or lightheadedness with hypotension: YES Has patient had a PCN reaction causing severe rash involving mucus membranes or skin necrosis:NO Has patient had a PCN reaction that required hospitalization NO Has patient had a PCN reaction occurring within the last 10 years: NO If all of the above answers are "NO", then may proceed with Cephalosporin use.  . Ace Inhibitors     REACTION: cough  . Aspirin   . Hydrocodone     REACTION: Hallucinations---minor  . Nsaids     Outpatient Encounter Medications as of 06/04/2018  Medication Sig  . acetaminophen (TYLENOL) 325 MG tablet Take 2 tablets (650 mg  total) by mouth 3 (three) times Ackley.  Marland Kitchen atorvastatin (LIPITOR) 20 MG tablet Take 1 tablet (20 mg total) by mouth Leech.  . capsaicin (ZOSTRIX) 0.025 % cream Apply topically as needed.  . carvedilol (COREG) 12.5 MG tablet Take 0.5 tablets (6.25 mg total) by mouth 2 (two) times Gloss.  . famotidine (PEPCID) 20 MG tablet TAKE 1 TABLET TWICE Weniger.  Marland Kitchen Glucosamine-Chondroitin 750-600 MG TABS Take 1 tablet by mouth 3 (three) times Crooke.  . hydrochlorothiazide (MICROZIDE) 12.5 MG capsule Take 1 capsule (12.5 mg total) by mouth Philbrick.  . isosorbide dinitrate (ISORDIL) 10 MG tablet Take 1 tablet (10 mg total) by mouth 2 (two) times Carne.  Marland Kitchen levothyroxine (SYNTHROID, LEVOTHROID) 75 MCG tablet Take 0.5 tablets (37.5 mcg total) by mouth Stawicki before breakfast.  . Loperamide HCl 2 MG CHEW 1 tablet every 6 hours as needed for diarrhea.  May take as a preventive measure prior to visits out of the facility  . Multiple Vitamin (MULTIVITAMIN) tablet Take 2 tablets by mouth Suppes.   Marland Kitchen MYRBETRIQ 25 MG TB24 tablet Take 1 tablet by mouth Hellums.  . ondansetron (ZOFRAN) 8 MG tablet Take 1 tablet (8 mg total) by mouth every 8 (eight) hours as needed for nausea or vomiting.  . Psyllium 400 MG CAPS Take 400 mg by mouth 2 (two) times Edling. WITH 4-6 OZ OF WATER   No facility-administered encounter medications on file as of 06/04/2018.     Review of Systems:  Review of Systems  Constitutional: Negative for chills and fever.  HENT: Positive for hearing loss. Negative for congestion.   Eyes: Positive for double vision.  Respiratory: Negative for shortness of breath.   Cardiovascular: Negative for chest pain and palpitations.  Gastrointestinal: Positive for diarrhea. Negative for abdominal pain, blood in stool, constipation, heartburn, melena, nausea and vomiting.       All better except the loose stools that still occur intermittently  Genitourinary: Positive for frequency and urgency. Negative for dysuria.        Improved with myrbetriq, but does need poise pads  Musculoskeletal: Negative for falls and joint pain.  Neurological: Negative for loss of consciousness.       Rare lightheadedness on standing  Psychiatric/Behavioral: Positive for memory loss.       Some paranoia per [name] that people are stealing things from her    Health Maintenance  Topic Date Due  . TETANUS/TDAP  03/31/1938  . DEXA SCAN  03/31/1986  . INFLUENZA VACCINE  Completed  . PNA vac Low Risk Adult  Completed    Physical Exam: Vitals:   06/04/18 1425  BP: 112/60  Pulse: 77  Temp: Marland Kitchen)  97.5 F (36.4 C)  TempSrc: Oral  SpO2: 96%  Weight: 150 lb (68 kg)  Height: 5\' 4"  (1.626 m)   Body mass index is 25.75 kg/m. Physical Exam  Constitutional: She appears well-developed and well-nourished. No distress.  HENT:  Head: Normocephalic and atraumatic.  Cardiovascular: Intact distal pulses.  Murmur heard. irreg irreg  Pulmonary/Chest: Effort normal and breath sounds normal. No respiratory distress.  Abdominal: Bowel sounds are normal.  Musculoskeletal: Normal range of motion. She exhibits no tenderness.  Using power chair to get down to clinic from [place]  Neurological: She is alert. No cranial nerve deficit. Coordination normal.  Skin: Skin is warm and dry.  Psychiatric: She has a normal mood and affect.    Labs reviewed: Basic Metabolic Panel: Recent Labs    09/26/17 1232 04/11/18 1430 04/23/18 0600  NA 143 141 143  K 4.0 3.8 3.9  CL 107 108  --   CO2 26 26  --   GLUCOSE 98 117*  --   BUN 11 13 11   CREATININE 0.67 0.84 0.6  CALCIUM 9.5 9.2  --    Liver Function Tests: Recent Labs    04/11/18 1430  AST 23  ALT 15  ALKPHOS 51  BILITOT 1.8*  PROT 5.7*  ALBUMIN 3.7   Recent Labs    04/11/18 1430  LIPASE 30   No results for input(s): AMMONIA in the last 8760 hours. CBC: Recent Labs    09/26/17 1232 04/11/18 1430 04/16/18 0928  WBC 9.3 7.5 7.8  NEUTROABS  --  4.5 5.3  HGB 11.5* 11.2* 12.0    HCT 36.9 37.3 35.5  MCV 94.4 99.5 91  PLT 177 166 185   Assessment/Plan 1. Pleural effusion -stable, cardiology did not feel like med adjustments were needed and I agree considering she is not having any changes in symptoms  2. Paroxysmal atrial fibrillation (HCC) -rate controlled with coreg, not on anticoagulation as was decided previously b/c she's had a lot of prior falls  3. Essential hypertension -bp at goal with current therapy, cont same  4. Abnormal CT of the abdomen -seen by GI and symptoms had improved except her loose bms -gerd better after cutting back on fruit cups   5. Irritable bowel syndrome with both constipation and diarrhea -continue imodium prn  6. Gastroesophageal reflux disease without esophagitis -no longer an issue when she doesn't eat three fruit cups per day  7. Nausea -also better w/o the fruit cups  8. Aortic valve stenosis, etiology of cardiac valve disease unspecified -was moderate on last echo, no new symptoms, monitor  9. OAB (overactive bladder) -cont myrbetriq which she reports may have helped--not entirely clear if it did or not -may need to move to depends moreso due to her irritable bowel with diarrhea  10.  Diplopia -notes this when she looks down while wearing her glasses -has not been to ophtho in several years--had seen Dr. Katy Fitch -agrees to see Dr. Ellie Lunch here to avoid traveling outside of [facility]  Labs/tests ordered:  No new Next appt:  10/15/2018  Tionne Dayhoff L. Ayoub Arey, D.O. [facility] [address], [place] [address] Cell Phone (Mon-Fri 8am-5pm):  [phone] On Call:  [phone] & follow prompts after 5pm & weekends Office Phone:  [phone] Office Fax:  [phone]

## 2018-07-31 DIAGNOSIS — Z85828 Personal history of other malignant neoplasm of skin: Secondary | ICD-10-CM | POA: Diagnosis not present

## 2018-07-31 DIAGNOSIS — L820 Inflamed seborrheic keratosis: Secondary | ICD-10-CM | POA: Diagnosis not present

## 2018-07-31 DIAGNOSIS — D692 Other nonthrombocytopenic purpura: Secondary | ICD-10-CM | POA: Diagnosis not present

## 2018-07-31 DIAGNOSIS — L570 Actinic keratosis: Secondary | ICD-10-CM | POA: Diagnosis not present

## 2018-07-31 DIAGNOSIS — L821 Other seborrheic keratosis: Secondary | ICD-10-CM | POA: Diagnosis not present

## 2018-08-11 DIAGNOSIS — H52203 Unspecified astigmatism, bilateral: Secondary | ICD-10-CM | POA: Diagnosis not present

## 2018-08-11 DIAGNOSIS — H02105 Unspecified ectropion of left lower eyelid: Secondary | ICD-10-CM | POA: Diagnosis not present

## 2018-08-11 DIAGNOSIS — H02102 Unspecified ectropion of right lower eyelid: Secondary | ICD-10-CM | POA: Diagnosis not present

## 2018-08-11 DIAGNOSIS — H4311 Vitreous hemorrhage, right eye: Secondary | ICD-10-CM | POA: Diagnosis not present

## 2018-08-24 NOTE — Progress Notes (Signed)
[facility] Note  08/25/2018     CHIEF COMPLAINT Patient presents for Retina Evaluation   HISTORY OF PRESENT ILLNESS: Susan [name] is a 85 y.o. female who presents to the clinic today for:   HPI    Retina Evaluation    In both eyes.  This started 2 weeks ago.  Associated Symptoms Negative for Flashes, Pain, Trauma, Fever, Floaters, Redness, Scalp Tenderness, Weight Loss, Fatigue, Jaw Claudication, Photophobia, Distortion, Blind Spot, Glare and Shoulder/Hip pain.  Context:  distance vision, mid-range vision and near vision.  Treatments tried include surgery.  Response to treatment was moderate improvement.  I, the attending physician,  performed the HPI with the patient and updated documentation appropriately.          Comments    Referral of DR. McCuen for retina eval. Patient is accompanied by her daughter today, patient states her vision has been blurry for sometime, denies flashes, floaters and ocular pain. Pt is taking multivitamin qd, denies gtt's       Last edited by Bernarda Caffey, MD on 08/25/2018 10:29 PM. (History)    pt is with her daughter today, daughter states pt she used to see Dr. Katy Fitch, but is now seeing Dr. Ellie Lunch at [facility], pt states she was not seeing very well and thought she needed new glasses when she saw McCuen, pt and daughter state Dr. Ellie Lunch was concerned about a possible bleed in her eye, pt states she has a hard time reading the newspaper, pt is on medication for HTN, pt has had cataract sx, but states she has not had any other sx, pts daughter states pt has a heart murmur, but no hx of stents   Referring physician: Luberta Mutter, MD [place], [place] [id number]  HISTORICAL INFORMATION:   Selected notes from the MEDICAL RECORD NUMBER Referred by Dr. Luberta Mutter for [facility] OD LEE: 02.03.20 (C. McCuen) [BCVA: OD: 20/30 OS: 20/25] Ocular Hx-eyelid sx, pseudo OU PMH-A. Fib, HTN, hypothyroidism    CURRENT  MEDICATIONS: No current outpatient medications on file. (Ophthalmic Drugs)   No current facility-administered medications for this visit.  (Ophthalmic Drugs)   Current Outpatient Medications (Other)  Medication Sig  . acetaminophen (TYLENOL) 325 MG tablet Take 2 tablets (650 mg total) by mouth 3 (three) times Cousin.  Marland Kitchen atorvastatin (LIPITOR) 20 MG tablet Take 1 tablet (20 mg total) by mouth Moates.  . capsaicin (ZOSTRIX) 0.025 % cream Apply topically as needed.  . carvedilol (COREG) 12.5 MG tablet Take 0.5 tablets (6.25 mg total) by mouth 2 (two) times Crookston.  . famotidine (PEPCID) 20 MG tablet TAKE 1 TABLET TWICE Kimbrell.  Marland Kitchen Glucosamine-Chondroitin 750-600 MG TABS Take 1 tablet by mouth 3 (three) times Salazar.  . isosorbide dinitrate (ISORDIL) 10 MG tablet Take 1 tablet (10 mg total) by mouth 2 (two) times Desmarais.  Marland Kitchen levothyroxine (SYNTHROID, LEVOTHROID) 75 MCG tablet Take 0.5 tablets (37.5 mcg total) by mouth Armendariz before breakfast.  . Loperamide HCl 2 MG CHEW 1 tablet every 6 hours as needed for diarrhea.  May take as a preventive measure prior to visits out of the facility  . Multiple Vitamin (MULTIVITAMIN) tablet Take 2 tablets by mouth Sedam.   Marland Kitchen MYRBETRIQ 25 MG TB24 tablet Take 1 tablet by mouth Struve.  . ondansetron (ZOFRAN) 8 MG tablet Take 1 tablet (8 mg total) by mouth every 8 (eight) hours as needed for nausea or vomiting.  . Psyllium 400 MG CAPS  Take 400 mg by mouth 2 (two) times Counsell. WITH 4-6 OZ OF WATER  . hydrochlorothiazide (MICROZIDE) 12.5 MG capsule Take 1 capsule (12.5 mg total) by mouth Evitt.   No current facility-administered medications for this visit.  (Other)      REVIEW OF SYSTEMS: ROS    Positive for: Musculoskeletal, Eyes   Negative for: Constitutional, Gastrointestinal, Neurological, Skin, Genitourinary, HENT, Endocrine, Cardiovascular, Respiratory, Psychiatric, Allergic/Imm, Heme/Lymph   Last edited by Zenovia Jordan, LPN on 0/86/5784  6:96 PM. (History)        ALLERGIES Allergies  Allergen Reactions  . Penicillins Diarrhea    Has patient had a PCN reaction causing immediate rash, facial/tongue/throat swelling, SOB or lightheadedness with hypotension: YES Has patient had a PCN reaction causing severe rash involving mucus membranes or skin necrosis:NO Has patient had a PCN reaction that required hospitalization NO Has patient had a PCN reaction occurring within the last 10 years: NO If all of the above answers are "NO", then may proceed with Cephalosporin use.  . Ace Inhibitors     REACTION: cough  . Aspirin   . Hydrocodone     REACTION: Hallucinations---minor  . Nsaids     PAST MEDICAL HISTORY Past Medical History:  Diagnosis Date  . Aortic stenosis   . Atrial fibrillation, new onset ([place])   . Bladder tumor   . CARCINOMA, BLADDER, TRANSITIONAL CELL 10/24/2007  . Cerebrovascular disease, unspecified   . Chest tightness    with nausea  . Chronic maxillary sinusitis   . Dizziness and giddiness   . GERD (gastroesophageal reflux disease)   . Heart murmur   . HYPERTENSION 01/07/2007  . HYPOTHYROIDISM 01/07/2007  . IBS 05/03/2008  . MIXED HEARING LOSS BILATERAL 09/28/2009  . Nausea   . OSTEOARTHRITIS, KNEE 04/25/2007  . UNSPECIFIED ARTHROPATHY MULTIPLE SITES 10/26/2009   Past Surgical History:  Procedure Laterality Date  . BELPHAROPTOSIS REPAIR    . BLADDER TUMOR EXCISION    . BUNIONECTOMY     bilateral  . CATARACT EXTRACTION     bilateral  . EYE SURGERY    . TONSILLECTOMY AND ADENOIDECTOMY     x2    FAMILY HISTORY Family History  Problem Relation Age of Onset  . Stomach cancer Mother   . Arthritis Mother   . Leukemia Father   . Kidney failure Father   . Anuerysm Brother        brain  . ALS Sister   . Stomach cancer Maternal Aunt   . Colon cancer Daughter   . Esophageal cancer Neg Hx   . Rectal cancer Neg Hx     SOCIAL HISTORY Social History   Tobacco Use  . Smoking status: Former Smoker    Packs/day:  0.50    Years: 20.00    Pack years: 10.00    Types: Cigarettes    Last attempt to quit: 09/05/1973    Years since quitting: 45.0  . Smokeless tobacco: Never Used  Substance Use Topics  . Alcohol use: Yes    Comment: ocass  . Drug use: No         OPHTHALMIC EXAM:  Base Eye Exam    Visual Acuity (Snellen - Linear)      Right Left   Dist cc 20/40 20/50   Dist ph cc NI 20/50 +1   Correction:  Glasses       Tonometry (Tonopen, 2:13 PM)      Right Left   Pressure 12 12  Pupils      Dark Light Shape React APD   Right 3 2 Round Brisk None   Left 3 2 Round Brisk None       Visual Fields      Left Right     Full   Restrictions Partial outer inferior nasal deficiency        Extraocular Movement      Right Left    Full, Ortho Full, Ortho       Neuro/Psych    Oriented x3:  Yes       Dilation    Both eyes:  1.0% Mydriacyl, 2.5% Phenylephrine @ 2:13 PM        Slit Lamp and Fundus Exam    Slit Lamp Exam      Right Left   Lids/Lashes Dermatochalasis - upper lid, Meibomian gland dysfunction, mild Ptosis, punctal ectropian upper and lower Dermatochalasis - upper lid, Meibomian gland dysfunction, mild Ptosis, punctal ectropian upper and lower   Conjunctiva/Sclera White and quiet White and quiet   Cornea 1+ Punctate epithelial erosions 1+ Punctate epithelial erosions   Anterior Chamber deep and clear deep and clear   Iris Round and dilated Round and dilated   Lens PC IOL with mild, inferior displacement, open PC PC IOL in good position   Vitreous Vitreous syneresis, Posterior vitreous detachment, mild vit heme settled inferiorly Vitreous syneresis, Posterior vitreous detachment       Fundus Exam      Right Left   Disc mild pallor, sharp rim Compact, Pink and Sharp   C/D Ratio  0.1   Macula Flat, Retinal pigment epithelial mottling, Drusen, No heme or edema Blunted foveal reflex, Drusen, Retinal pigment epithelial mottling, No heme or edema   Vessels Vascular  attenuation, Tortuous Vascular attenuation, Tortuous   Periphery Attached, midzonal drusen, reticular degeneration, no RT/RD Attached, midzonal drusen, reticular degeneration, no RT/RD          IMAGING AND PROCEDURES  Imaging and Procedures for @TODAY @  OCT, Retina - OU - Both Eyes       Right Eye Quality was good. Central Foveal Thickness: 288. Progression has no prior data. Findings include normal foveal contour, no IRF, no SRF, retinal drusen  (Trace ERM).   Left Eye Quality was good. Central Foveal Thickness: 295. Progression has no prior data. Findings include abnormal foveal contour, epiretinal membrane, retinal drusen , no SRF, no IRF.   Notes *Images captured and stored on drive  Diagnosis / Impression:  Non-exu ARMD OU Mild ERM OU (OS>OD)   Clinical management:  See below  Abbreviations: NFP - Normal foveal profile. CME - cystoid macular edema. PED - pigment epithelial detachment. IRF - intraretinal fluid. SRF - subretinal fluid. EZ - ellipsoid zone. ERM - epiretinal membrane. ORA - outer retinal atrophy. ORT - outer retinal tubulation. SRHM - subretinal hyper-reflective material                 ASSESSMENT/PLAN:    ICD-10-CM   1. Vitreous hemorrhage of right eye (HCC) H43.11   2. Intermediate stage nonexudative age-related macular degeneration of both eyes H35.3132   3. Retinal edema H35.81 OCT, Retina - OU - Both Eyes  4. Essential hypertension I10   5. Hypertensive retinopathy of both eyes H35.033   6. Pseudophakia of both eyes Z96.1    1. Vitreous hemorrhage OD - mild vitreous hemorrhage -- appears to be clearing centrally and settling inferiorly - unclear etiology, but no retinal tear or detachment on  360 peripheral exam - VH precautions reviewed -- minimize activities, keep head elevated, avoid ASA/NSAIDs/blood thinners as able - recommend monitoring for now - f/u in 2 wks  2. Age related macular degeneration, non-exudative, both eyes  (OS>OD)  - intermediate stage  - The incidence, anatomy, and pathology of dry AMD, risk of progression, and the AREDS and AREDS 2 study including smoking risks discussed with patient.  - Recommend amsler grid monitoring  3. No retinal edema on exam or OCT  4,5. Hypertensive retinopathy OU  - discussed importance of tight BP control  - monitor  6. Pseudophakia OU  - s/p CE/IOL OU  - beautiful surgeries  - monitor   Ophthalmic Meds Ordered this visit:  No orders of the defined types were placed in this encounter.      Return for f/u 2-3 weeks VH OD, DFE, OCT.  There are no Patient Instructions on file for this visit.   Explained the diagnoses, plan, and follow up with the patient and they expressed understanding.  Patient expressed understanding of the importance of proper follow up care.   Gardiner Sleeper, M.D., Ph.D. Diseases & Surgery of the Retina and Vitreous [facility]  I have reviewed the above documentation for accuracy and completeness, and I agree with the above. Gardiner Sleeper, M.D., Ph.D. 08/25/18 10:47 PM  Abbreviations: M myopia (nearsighted); A astigmatism; H hyperopia (farsighted); P presbyopia; Mrx spectacle prescription;  CTL contact lenses; OD right eye; OS left eye; OU both eyes  XT exotropia; ET esotropia; PEK punctate epithelial keratitis; PEE punctate epithelial erosions; DES dry eye syndrome; MGD meibomian gland dysfunction; ATs artificial tears; PFAT's preservative free artificial tears; Ruth nuclear sclerotic cataract; PSC posterior subcapsular cataract; ERM epi-retinal membrane; PVD posterior vitreous detachment; RD retinal detachment; DM diabetes mellitus; DR diabetic retinopathy; NPDR non-proliferative diabetic retinopathy; PDR proliferative diabetic retinopathy; CSME clinically significant macular edema; DME diabetic macular edema; dbh dot blot hemorrhages; CWS cotton wool spot; POAG primary open angle glaucoma; C/D cup-to-disc  ratio; HVF humphrey visual field; GVF goldmann visual field; OCT optical coherence tomography; IOP intraocular pressure; BRVO Branch retinal vein occlusion; CRVO central retinal vein occlusion; CRAO central retinal artery occlusion; BRAO branch retinal artery occlusion; RT retinal tear; SB scleral buckle; PPV pars plana vitrectomy; VH Vitreous hemorrhage; PRP panretinal laser photocoagulation; IVK intravitreal kenalog; VMT vitreomacular traction; MH Macular hole;  NVD neovascularization of the disc; NVE neovascularization elsewhere; AREDS age related eye disease study; ARMD age related macular degeneration; POAG primary open angle glaucoma; EBMD epithelial/anterior basement membrane dystrophy; ACIOL anterior chamber intraocular lens; IOL intraocular lens; PCIOL posterior chamber intraocular lens; Phaco/IOL phacoemulsification with intraocular lens placement; B[place]ier photorefractive keratectomy; LASIK laser assisted in situ keratomileusis; HTN hypertension; DM diabetes mellitus; COPD chronic obstructive pulmonary disease

## 2018-08-25 ENCOUNTER — Encounter (INDEPENDENT_AMBULATORY_CARE_PROVIDER_SITE_OTHER): Payer: Self-pay | Admitting: Ophthalmology

## 2018-08-25 ENCOUNTER — Ambulatory Visit (INDEPENDENT_AMBULATORY_CARE_PROVIDER_SITE_OTHER): Payer: Medicare Other | Admitting: Ophthalmology

## 2018-08-25 DIAGNOSIS — H4311 Vitreous hemorrhage, right eye: Secondary | ICD-10-CM | POA: Diagnosis not present

## 2018-08-25 DIAGNOSIS — Z961 Presence of intraocular lens: Secondary | ICD-10-CM | POA: Diagnosis not present

## 2018-08-25 DIAGNOSIS — H35033 Hypertensive retinopathy, bilateral: Secondary | ICD-10-CM

## 2018-08-25 DIAGNOSIS — I10 Essential (primary) hypertension: Secondary | ICD-10-CM

## 2018-08-25 DIAGNOSIS — H3581 Retinal edema: Secondary | ICD-10-CM | POA: Diagnosis not present

## 2018-08-25 DIAGNOSIS — H353132 Nonexudative age-related macular degeneration, bilateral, intermediate dry stage: Secondary | ICD-10-CM

## 2018-09-09 ENCOUNTER — Encounter (INDEPENDENT_AMBULATORY_CARE_PROVIDER_SITE_OTHER): Payer: Medicare Other | Admitting: Ophthalmology

## 2018-09-17 ENCOUNTER — Encounter: Payer: Medicare Other | Admitting: Internal Medicine

## 2018-10-15 ENCOUNTER — Encounter: Payer: Self-pay | Admitting: Internal Medicine

## 2018-10-15 ENCOUNTER — Non-Acute Institutional Stay: Payer: Medicare Other | Admitting: Internal Medicine

## 2018-10-15 ENCOUNTER — Other Ambulatory Visit: Payer: Self-pay

## 2018-10-15 VITALS — BP 118/72 | HR 82 | Temp 97.6°F | Ht 64.0 in | Wt 140.0 lb

## 2018-10-15 DIAGNOSIS — G3184 Mild cognitive impairment, so stated: Secondary | ICD-10-CM

## 2018-10-15 DIAGNOSIS — E034 Atrophy of thyroid (acquired): Secondary | ICD-10-CM | POA: Diagnosis not present

## 2018-10-15 DIAGNOSIS — I480 Paroxysmal atrial fibrillation: Secondary | ICD-10-CM

## 2018-10-15 DIAGNOSIS — C679 Malignant neoplasm of bladder, unspecified: Secondary | ICD-10-CM

## 2018-10-15 DIAGNOSIS — K582 Mixed irritable bowel syndrome: Secondary | ICD-10-CM

## 2018-10-15 DIAGNOSIS — I350 Nonrheumatic aortic (valve) stenosis: Secondary | ICD-10-CM | POA: Diagnosis not present

## 2018-10-15 NOTE — Progress Notes (Signed)
Location:  [facility] Room Number: 932 Place of Service:  Clinic (12)  Provider: Maddux Vanscyoc L. Mariea Clonts, D.O., C.M.D.  Code Status: DNR, MOST Goals of Care:  Advanced Directives 10/15/2018  Does Patient Have a Medical Advance Directive? Yes  Type of Advance Directive Out of facility DNR (pink MOST or yellow form)  Does patient want to make changes to medical advance directive? -  Copy of [place] in Chart? -  Would patient like information on creating a medical advance directive? -  Pre-existing out of facility DNR order (yellow form or pink MOST form) Yellow form placed in chart (order not valid for inpatient use)     Chief Complaint  Patient presents with  . Medical Management of Chronic Issues    4 month follow-up. Patient c/o of sore toe on right foot. Per [facility] nurse patient with more confusion   . Best Practice Recommendations    Discuss DEXA recommendation   . Immunizations    Discuss need for TD/TDaP    HPI: Patient is a 85 y.o. female seen today for medical management of chronic diseases.  Pt does not ambulate--uses her power chair to get around.    She has c/o soreness b/w her second and third toes of her right foot; however, there are no open areas.  She thinks her shoe was pushing on the toes.  There is some swelling of both feet, slightly more on the right.    She has lost 10 lbs since I last saw her; however, she reports eating well more often than not.  She is on chocolate supplement shakes with lactaid.  She does like them and drinks them.    She is asking if her glasses can be fixed b/c they slide down her nose when she reads and it's annoying.  Looking in her chart, she's apparently seen Dr. Ellie Lunch here and then was referred to Dr. Coralyn Pear due to vitreous hemorrhage.  It was mild.  She is to avoid blood thinners and she also does have intermediate macular degeneration and hypertensive retinopathy of the left eye.   She was meant to f/u but appt must have been canceled due to covid.    She notes some constipation, but only diarrhea if she eats too much chocolate or milk products.    She doesn't sleep that great, but naps a lot through the day.  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  Her memory is declining and nursing had reported that concern about a month ago when she was initially meant to see me.    She continues with some incontinence of urine.  Not too bothered by this.  Uses appropriate undergarments.  Remains very [place] despite hearing aid use.  Staff put them in for her b/c she struggles with her arthritis to get them in herself.  Spoke with Education officer, museum after our visit and Butch Penny reports that resident is borderline on her needs assessment as far as AL vs skilled, but we are avoiding moving people at this time due to the pandemic situation.  Memory is declining and she's needing more help with ADLs.  Having more incontinence.  Past Medical History:  Diagnosis Date  . Aortic stenosis   . Atrial fibrillation, new onset ([place])   . Bladder tumor   . CARCINOMA, BLADDER, TRANSITIONAL CELL 10/24/2007  . Cerebrovascular disease, unspecified   . Chest tightness  with nausea  . Chronic maxillary sinusitis   . Dizziness and giddiness   . GERD (gastroesophageal reflux disease)   . Heart murmur   . HYPERTENSION 01/07/2007  . HYPOTHYROIDISM 01/07/2007  . IBS 05/03/2008  . MIXED HEARING LOSS BILATERAL 09/28/2009  . Nausea   . OSTEOARTHRITIS, KNEE 04/25/2007  . UNSPECIFIED ARTHROPATHY MULTIPLE SITES 10/26/2009    Past Surgical History:  Procedure Laterality Date  . BELPHAROPTOSIS REPAIR    . BLADDER TUMOR EXCISION    . BUNIONECTOMY     bilateral  . CATARACT EXTRACTION     bilateral  . EYE SURGERY    . TONSILLECTOMY AND ADENOIDECTOMY     x2    Allergies  Allergen Reactions  . Penicillins Diarrhea    Has patient had a  PCN reaction causing immediate rash, facial/tongue/throat swelling, SOB or lightheadedness with hypotension: YES Has patient had a PCN reaction causing severe rash involving mucus membranes or skin necrosis:NO Has patient had a PCN reaction that required hospitalization NO Has patient had a PCN reaction occurring within the last 10 years: NO If all of the above answers are "NO", then may proceed with Cephalosporin use.  . Ace Inhibitors     REACTION: cough  . Aspirin   . Hydrocodone     REACTION: Hallucinations---minor  . Nsaids     Outpatient Encounter Medications as of 10/15/2018  Medication Sig  . acetaminophen (TYLENOL) 325 MG tablet Take 2 tablets (650 mg total) by mouth 3 (three) times Mcnear.  Marland Kitchen atorvastatin (LIPITOR) 20 MG tablet Take 1 tablet (20 mg total) by mouth Lottman.  . capsaicin (ZOSTRIX) 0.025 % cream Apply topically as needed.  . carvedilol (COREG) 12.5 MG tablet Take 0.5 tablets (6.25 mg total) by mouth 2 (two) times Limones.  . famotidine (PEPCID) 20 MG tablet TAKE 1 TABLET TWICE Pinon.  Marland Kitchen Glucosamine-Chondroitin 750-600 MG TABS Take 1 tablet by mouth 3 (three) times Knight.  . hydrochlorothiazide (HYDRODIURIL) 12.5 MG tablet 1 by mouth Larose in the am  . isosorbide dinitrate (ISORDIL) 10 MG tablet Take 1 tablet (10 mg total) by mouth 2 (two) times Tilmon.  Marland Kitchen lactose free nutrition (BOOST PLUS) LIQD Take 237 mLs by mouth Calvillo.  Marland Kitchen levothyroxine (SYNTHROID, LEVOTHROID) 75 MCG tablet Take 0.5 tablets (37.5 mcg total) by mouth Ausmus before breakfast.  . Loperamide HCl 2 MG CHEW 1 tablet every 6 hours as needed for diarrhea.  May take as a preventive measure prior to visits out of the facility  . Multiple Vitamin (MULTIVITAMIN) tablet Take 2 tablets by mouth Canny.   Marland Kitchen MYRBETRIQ 25 MG TB24 tablet Take 1 tablet by mouth Santarelli.  . ondansetron (ZOFRAN) 8 MG tablet Take 1 tablet (8 mg total) by mouth every 8 (eight) hours as needed for nausea or vomiting.  . Psyllium 400 MG CAPS Take  400 mg by mouth 2 (two) times Dehaven. WITH 4-6 OZ OF WATER  . [DISCONTINUED] hydrochlorothiazide (MICROZIDE) 12.5 MG capsule Take 1 capsule (12.5 mg total) by mouth Birky.   No facility-administered encounter medications on file as of 10/15/2018.     Review of Systems:  Review of Systems  Constitutional: Positive for malaise/fatigue and weight loss. Negative for chills and fever.  HENT: Positive for hearing loss. Negative for congestion.   Eyes:       Glasses keep sliding down her nose  Respiratory: Negative for cough and shortness of breath.   Cardiovascular: Positive for leg swelling. Negative for chest pain and palpitations.  Gastrointestinal: Positive for diarrhea. Negative for abdominal pain, blood in stool, melena, nausea and vomiting.  Genitourinary:       Incontinence  Musculoskeletal: Negative for falls and joint pain.  Skin: Negative for itching and rash.  Neurological: Negative for dizziness and loss of consciousness.  Psychiatric/Behavioral: Positive for memory loss.       Does not sleep well, but naps a lot in the daytime    Health Maintenance  Topic Date Due  . TETANUS/TDAP  03/31/1938  . DEXA SCAN  03/31/1986  . INFLUENZA VACCINE  02/07/2019  . PNA vac Low Risk Adult  Completed    Physical Exam: Vitals:   10/15/18 1336  BP: 118/72  Pulse: 82  Temp: 97.6 F (36.4 C)  TempSrc: Oral  SpO2: 95%  Weight: 140 lb (63.5 kg)  Height: 5\' 4"  (1.626 m)   Body mass index is 24.03 kg/m. Physical Exam Vitals signs and nursing note reviewed.  Constitutional:      General: She is not in acute distress.    Appearance: Normal appearance. She is not ill-appearing or toxic-appearing.     Comments: Has lost weight  HENT:     Head: Normocephalic and atraumatic.     Ears:     Comments: HOH even with hearing aids Eyes:     Comments: Glasses sliding down her nose  Cardiovascular:     Heart sounds: Murmur present.     Comments: irreg irreg Pulmonary:     Effort:  Pulmonary effort is normal.  Abdominal:     General: Bowel sounds are normal. There is no distension.     Tenderness: There is no abdominal tenderness.  Musculoskeletal: Normal range of motion.  Skin:    General: Skin is warm and dry.     Coloration: Skin is pale.     Comments: Showed me where she has pain b/w her toes but nothing visible there; mild edema right greater than left b/w straps of her shoes, nonpitting  Neurological:     General: No focal deficit present.     Mental Status: She is alert.     Comments: Oriented to person, place, time, but did need a little instruction to find her way back to AL from the clinic area  Psychiatric:        Mood and Affect: Mood normal.        Behavior: Behavior normal.     Labs reviewed: Basic Metabolic Panel: Recent Labs    04/11/18 1430 04/23/18 0600  NA 141 143  K 3.8 3.9  CL 108  --   CO2 26  --   GLUCOSE 117*  --   BUN 13 11  CREATININE 0.84 0.6  CALCIUM 9.2  --    Liver Function Tests: Recent Labs    04/11/18 1430  AST 23  ALT 15  ALKPHOS 51  BILITOT 1.8*  PROT 5.7*  ALBUMIN 3.7   Recent Labs    04/11/18 1430  LIPASE 30   No results for input(s): AMMONIA in the last 8760 hours. CBC: Recent Labs    04/11/18 1430 04/16/18 0928  WBC 7.5 7.8  NEUTROABS 4.5 5.3  HGB 11.2* 12.0  HCT 37.3 35.5  MCV 99.5 91  PLT 166 185   Lipid Panel: No results for input(s): CHOL, HDL, LDLCALC, TRIG, CHOLHDL, LDLDIRECT in the last 8760 hours. No results found for: HGBA1C  Assessment/Plan 1. Mild cognitive impairment with memory loss -seems she is reaching an early stage of dementia now  with increasing memory loss and functional decline -may soon need SNF level of care or increased caregiver support in her AL apt  2. Hypothyroidism due to acquired atrophy of thyroid -will need tsh if 2018 is really last on file (pt recently transferred care to me from her PCP who may have done one) Lab Results  Component Value Date    TSH 0.93 04/11/2017    3. Irritable bowel syndrome with both constipation and diarrhea -no obvious explanation for change in bowels on CT and it seems she does not tolerate milk products or too much fruit or chocolate (gets diarrhea) so she's restricting these some with benefit  4. Paroxysmal atrial fibrillation (HCC) -no recent complications  5. Malignant neoplasm of urinary bladder, unspecified site [facility]) -historically, no changes related to this noted on her CT abd/pelvis we did in october  6. Aortic valve stenosis, etiology of cardiac valve disease unspecified -chronic, stable, no related symptoms, monitor  Labs/tests ordered:  No new Next appt: 02/11/2019  Rod Majerus L. Guilianna Mckoy, D.O. [facility] [address], [place] [address] Cell Phone (Mon-Fri 8am-5pm):  [phone] On Call:  [phone] & follow prompts after 5pm & weekends Office Phone:  [phone] Office Fax:  [phone]

## 2018-10-24 DIAGNOSIS — R41840 Attention and concentration deficit: Secondary | ICD-10-CM | POA: Diagnosis not present

## 2018-10-24 DIAGNOSIS — R278 Other lack of coordination: Secondary | ICD-10-CM | POA: Diagnosis not present

## 2018-10-24 DIAGNOSIS — I6789 Other cerebrovascular disease: Secondary | ICD-10-CM | POA: Diagnosis not present

## 2018-10-24 DIAGNOSIS — M6389 Disorders of muscle in diseases classified elsewhere, multiple sites: Secondary | ICD-10-CM | POA: Diagnosis not present

## 2018-10-27 DIAGNOSIS — M6389 Disorders of muscle in diseases classified elsewhere, multiple sites: Secondary | ICD-10-CM | POA: Diagnosis not present

## 2018-10-27 DIAGNOSIS — R41840 Attention and concentration deficit: Secondary | ICD-10-CM | POA: Diagnosis not present

## 2018-10-27 DIAGNOSIS — R278 Other lack of coordination: Secondary | ICD-10-CM | POA: Diagnosis not present

## 2018-10-27 DIAGNOSIS — I6789 Other cerebrovascular disease: Secondary | ICD-10-CM | POA: Diagnosis not present

## 2018-10-28 DIAGNOSIS — R278 Other lack of coordination: Secondary | ICD-10-CM | POA: Diagnosis not present

## 2018-10-28 DIAGNOSIS — M6389 Disorders of muscle in diseases classified elsewhere, multiple sites: Secondary | ICD-10-CM | POA: Diagnosis not present

## 2018-10-28 DIAGNOSIS — I6789 Other cerebrovascular disease: Secondary | ICD-10-CM | POA: Diagnosis not present

## 2018-10-28 DIAGNOSIS — R41840 Attention and concentration deficit: Secondary | ICD-10-CM | POA: Diagnosis not present

## 2018-10-30 DIAGNOSIS — I6789 Other cerebrovascular disease: Secondary | ICD-10-CM | POA: Diagnosis not present

## 2018-10-30 DIAGNOSIS — R278 Other lack of coordination: Secondary | ICD-10-CM | POA: Diagnosis not present

## 2018-10-30 DIAGNOSIS — M6389 Disorders of muscle in diseases classified elsewhere, multiple sites: Secondary | ICD-10-CM | POA: Diagnosis not present

## 2018-10-30 DIAGNOSIS — R41840 Attention and concentration deficit: Secondary | ICD-10-CM | POA: Diagnosis not present

## 2018-11-04 ENCOUNTER — Non-Acute Institutional Stay (SKILLED_NURSING_FACILITY): Payer: Medicare Other | Admitting: Internal Medicine

## 2018-11-04 ENCOUNTER — Encounter: Payer: Self-pay | Admitting: Internal Medicine

## 2018-11-04 DIAGNOSIS — I350 Nonrheumatic aortic (valve) stenosis: Secondary | ICD-10-CM | POA: Diagnosis not present

## 2018-11-04 DIAGNOSIS — R296 Repeated falls: Secondary | ICD-10-CM | POA: Diagnosis not present

## 2018-11-04 DIAGNOSIS — K582 Mixed irritable bowel syndrome: Secondary | ICD-10-CM | POA: Diagnosis not present

## 2018-11-04 DIAGNOSIS — I480 Paroxysmal atrial fibrillation: Secondary | ICD-10-CM

## 2018-11-04 DIAGNOSIS — G301 Alzheimer's disease with late onset: Secondary | ICD-10-CM | POA: Diagnosis not present

## 2018-11-04 DIAGNOSIS — I6789 Other cerebrovascular disease: Secondary | ICD-10-CM | POA: Diagnosis not present

## 2018-11-04 DIAGNOSIS — R278 Other lack of coordination: Secondary | ICD-10-CM | POA: Diagnosis not present

## 2018-11-04 DIAGNOSIS — F0280 Dementia in other diseases classified elsewhere without behavioral disturbance: Secondary | ICD-10-CM

## 2018-11-04 DIAGNOSIS — M6389 Disorders of muscle in diseases classified elsewhere, multiple sites: Secondary | ICD-10-CM | POA: Diagnosis not present

## 2018-11-04 DIAGNOSIS — E034 Atrophy of thyroid (acquired): Secondary | ICD-10-CM | POA: Diagnosis not present

## 2018-11-04 DIAGNOSIS — R41840 Attention and concentration deficit: Secondary | ICD-10-CM | POA: Diagnosis not present

## 2018-11-04 NOTE — Progress Notes (Signed)
Patient ID: Susan [name], female   DOB: 1919/01/16, 85 y.o.   MRN: [id number]  Provider:  Rexene Edison. Mariea Clonts, D.O., C.M.D. Location:  [facility] Room Number: 638 memory care Place of Service:  SNF (31)  PCP: Susan Curry, DO Patient Care Team: Susan Curry, DO as PCP - General (Geriatric Medicine) Minus Breeding, MD as PCP - Cardiology (Cardiology)  Extended Emergency Contact Information Primary Emergency Contact: [name],Susan Address: [address], [place] [address] Johnnette Litter of [place] Phone: [phone] Mobile Phone: [phone] Relation: Daughter Secondary Emergency Contact: Susan [name] [place] of [place] Phone: [phone] Mobile Phone: [phone] Relation: Daughter  Code Status: DNR Goals of Care: Advanced Directive information Advanced Directives 11/04/2018  Does Patient Have a Medical Advance Directive? Yes  Type of Paramedic of [place];Out of facility DNR (pink MOST or yellow form)  Does patient want to make changes to medical advance directive? No - Patient declined  Copy of [place] in Chart? Yes - validated most recent copy scanned in chart (See row information)  Would patient like information on creating a medical advance directive? -  Pre-existing out of facility DNR order (yellow form or pink MOST form) Yellow form placed in chart (order not valid for inpatient use)   Chief Complaint  Patient presents with   New Admit To SNF    new admission to memory care    HPI: Patient is a 85 y.o. female with progressive dementia, hypothyroidism, IBS, paroxysmal afib, pleural effusion, aortic stenosis, vitreous hemorrhage of right eye and macular degeneration, advanced hearing loss seen today for admission to memory care from assisted living.  [name] has been declining in her functional abilities in AL for the past 3 years.  She had a rehab stay in  2017 when some small problems with her memory were noted, but lately, staff have noted considerable short-term memory problems and she's been reverting back to stories from the past.  She also has been falling frequently in her AL apt.  OT evaluated her and determined she was no longer safe to operate her power chair.  On her level of care assessment, it was noted that she met criteria for a skilled level of care.  In discussion between social work and DON and pt's daughter, Susan [name], it was decided to move her to memory care due to her progressive cognitive and functional losses and plans to get her a room close to the center of things so she could be supervised more closely.    When seen in memory care today, she had been asking to go to the restroom multiple times per day.  She was urinating small amounts.  She only had one small loose bm this afternoon.  She's been asking for chocolate candy and did advise staff about her prior difficulty with loose bms with excess chocolate and excess fruit cups previously.    She continues not to see or hear well.  She is trying to adjust to [facility].  She's upset that she does not have her scooter now but she was dangerous driving it per OT.    Past Medical History:  Diagnosis Date   Aortic stenosis    Atrial fibrillation, new onset [facility])    Bladder tumor    CARCINOMA, BLADDER, TRANSITIONAL CELL 10/24/2007   Cerebrovascular disease, unspecified    Chest tightness    with nausea  Chronic maxillary sinusitis    Dizziness and giddiness    GERD (gastroesophageal reflux disease)    Heart murmur    HYPERTENSION 01/07/2007   HYPOTHYROIDISM 01/07/2007   IBS 05/03/2008   MIXED HEARING LOSS BILATERAL 09/28/2009   Nausea    OSTEOARTHRITIS, KNEE 04/25/2007   UNSPECIFIED ARTHROPATHY MULTIPLE SITES 10/26/2009   Past Surgical History:  Procedure Laterality Date   BELPHAROPTOSIS REPAIR     BLADDER TUMOR EXCISION     BUNIONECTOMY     bilateral    CATARACT EXTRACTION     bilateral   EYE SURGERY     TONSILLECTOMY AND ADENOIDECTOMY     x2    reports that she quit smoking about 45 years ago. Her smoking use included cigarettes. She has a 10.00 pack-year smoking history. She has never used smokeless tobacco. She reports current alcohol use. She reports that she does not use drugs. Social History   Socioeconomic History   Marital status: Widowed    Spouse name: Not on file   Number of children: 3   Years of education: Not on file   Highest education level: Not on file  Occupational History   Occupation: retired    Fish farm manager: RETIRED  Scientist, product/process development strain: Not on file   Food insecurity:    Worry: Not on file    Inability: Not on file   Transportation needs:    Medical: Not on file    Non-medical: Not on file  Tobacco Use   Smoking status: Former Smoker    Packs/day: 0.50    Years: 20.00    Pack years: 10.00    Types: Cigarettes    Last attempt to quit: 09/05/1973    Years since quitting: 45.1   Smokeless tobacco: Never Used  Substance and Sexual Activity   Alcohol use: Yes    Comment: ocass   Drug use: No   Sexual activity: Not Currently  Lifestyle   Physical activity:    Days per week: 0 days    Minutes per session: 0 min   Stress: Not on file  Relationships   Social connections:    Talks on phone: Not on file    Gets together: Not on file    Attends religious service: Not on file    Active member of club or organization: Not on file    Attends meetings of clubs or organizations: Not on file    Relationship status: Not on file   Intimate partner violence:    Fear of current or ex partner: Not on file    Emotionally abused: Not on file    Physically abused: Not on file    Forced sexual activity: Not on file  Other Topics Concern   Not on file  Social History Narrative   Tobacco use, amount per day now: NONE   Past tobacco use, amount per day: 1/2 PACK [place]   How  many years did you use tobacco:  7-8 YEARS   Alcohol use (drinks per week): NONE NOW ( OR RARELY)/ IN THE PAST 1-2 PER MONTH   Diet:   Do you drink/eat things with caffeine: YES, ICE TEA, CHOCOLATE   Marital status:     WIDOWED                             What year were you married? 1950   Do you live in a house, apartment, assisted living,  condo, trailer, etc.? ASSISTED LIVING   Is it one or more stories? LIVE ON THE FIRST FLOOR   How many persons live in your home?   Do you have pets in your home?( please list) NO   Current or past profession:  TEACHER/ NON PROFIT EXECUTIVE DIRECTOR   Do you exercise?          NO                         Type and how often?   Do you have a living will? YES   Do you have a DNR form?                                   If not, do you want to discuss one?   Do you have signed POA/HPOA forms?     YES                   If so, please bring to you appointment    Functional Status Survey:    Family History  Problem Relation Age of Onset   Stomach cancer Mother    Arthritis Mother    Leukemia Father    Kidney failure Father    Anuerysm Brother        brain   ALS Sister    Stomach cancer Maternal Aunt    Colon cancer Daughter    Esophageal cancer Neg Hx    Rectal cancer Neg Hx     Health Maintenance  Topic Date Due   TETANUS/TDAP  03/31/1940   DEXA SCAN  03/31/1986   INFLUENZA VACCINE  02/07/2019   PNA vac Low Risk Adult  Completed    Allergies  Allergen Reactions   Penicillins Diarrhea    Has patient had a PCN reaction causing immediate rash, facial/tongue/throat swelling, SOB or lightheadedness with hypotension: YES Has patient had a PCN reaction causing severe rash involving mucus membranes or skin necrosis:NO Has patient had a PCN reaction that required hospitalization NO Has patient had a PCN reaction occurring within the last 10 years: NO If all of the above answers are "NO", then may proceed with Cephalosporin use.   Ace  Inhibitors     REACTION: cough   Aspirin    Hydrocodone     REACTION: Hallucinations---minor   Nsaids     Outpatient Encounter Medications as of 11/04/2018  Medication Sig   acetaminophen (TYLENOL) 325 MG tablet Take 2 tablets (650 mg total) by mouth 3 (three) times Prowse.   atorvastatin (LIPITOR) 20 MG tablet Take 1 tablet (20 mg total) by mouth Prothero.   capsaicin (ZOSTRIX) 0.025 % cream Apply topically as needed.   carvedilol (COREG) 12.5 MG tablet Take 0.5 tablets (6.25 mg total) by mouth 2 (two) times Halk.   famotidine (PEPCID) 20 MG tablet TAKE 1 TABLET TWICE Chandran.   Glucosamine-Chondroitin 750-600 MG TABS Take 1 tablet by mouth 3 (three) times Vandalen.   hydrochlorothiazide (HYDRODIURIL) 12.5 MG tablet Take 12.5 mg by mouth Morden.    isosorbide dinitrate (ISORDIL) 10 MG tablet Take 1 tablet (10 mg total) by mouth 2 (two) times Levinson.   lactose free nutrition (BOOST PLUS) LIQD Take 237 mLs by mouth Monier.   levothyroxine (SYNTHROID, LEVOTHROID) 75 MCG tablet Take 0.5 tablets (37.5 mcg total) by mouth Corona before breakfast.   Loperamide HCl 2 MG CHEW 1 tablet  every 6 hours as needed for diarrhea.  May take as a preventive measure prior to visits out of the facility   Multiple Vitamin (MULTIVITAMIN) tablet Take 2 tablets by mouth Estey.    MYRBETRIQ 25 MG TB24 tablet Take 1 tablet by mouth Viegas.   ondansetron (ZOFRAN) 8 MG tablet Take 1 tablet (8 mg total) by mouth every 8 (eight) hours as needed for nausea or vomiting.   Psyllium 400 MG CAPS Take 400 mg by mouth 2 (two) times Raider. WITH 4-6 OZ OF WATER   No facility-administered encounter medications on file as of 11/04/2018.     Review of Systems  Constitutional: Positive for fatigue and unexpected weight change. Negative for activity change, appetite change, chills and fever.  HENT: Positive for hearing loss. Negative for congestion and trouble swallowing.   Eyes: Negative for visual disturbance.       Poor  vision  Respiratory: Negative for chest tightness and shortness of breath.   Cardiovascular: Negative for chest pain, palpitations and leg swelling.  Gastrointestinal: Positive for diarrhea. Negative for abdominal distention, abdominal pain, blood in stool, constipation, nausea and vomiting.  Endocrine: Negative for polyphagia.  Genitourinary: Positive for frequency and urgency. Negative for dysuria.  Musculoskeletal: Positive for gait problem. Negative for arthralgias and back pain.  Skin: Negative for color change.  Neurological: Positive for weakness. Negative for dizziness.  Hematological: Bruises/bleeds easily.  Psychiatric/Behavioral: Positive for confusion. Negative for agitation, behavioral problems and sleep disturbance. The patient is not nervous/anxious.     Vitals:   11/04/18 0958  BP: 105/60  Pulse: 88  Resp: 18  Temp: (!) 96.6 F (35.9 C)  TempSrc: Oral  SpO2: 92%  Weight: 139 lb (63 kg)  Height: _0  (1.626 m)   Body mass index is 23.86 kg/m. Physical Exam Vitals signs reviewed.  Constitutional:      General: She is not in acute distress.    Appearance: She is not toxic-appearing.     Comments: Down only 1 lb in past 20 days (had been losing prior to that more notably)  HENT:     Head: Normocephalic and atraumatic.     Ears:     Comments: Hearing aids    Nose: Nose normal.     Mouth/Throat:     Pharynx: Oropharynx is clear.  Eyes:     Extraocular Movements: Extraocular movements intact.     Conjunctiva/sclera: Conjunctivae normal.     Pupils: Pupils are equal, round, and reactive to light.     Comments: glasses  Neck:     Musculoskeletal: Neck supple.  Cardiovascular:     Rate and Rhythm: Normal rate and regular rhythm.     Heart sounds: Murmur present.  Pulmonary:     Effort: Pulmonary effort is normal.     Comments: Diminished breath sounds left base persist Abdominal:     General: Bowel sounds are normal. There is no distension.      Palpations: Abdomen is soft. There is no mass.     Tenderness: There is no abdominal tenderness. There is no guarding or rebound.  Musculoskeletal: Normal range of motion.  Skin:    General: Skin is warm and dry.     Capillary Refill: Capillary refill takes less than 2 seconds.     Coloration: Skin is pale.  Neurological:     General: No focal deficit present.     Mental Status: She is alert. She is disoriented.     Cranial Nerves: No cranial  nerve deficit.     Motor: Weakness present.     Gait: Gait abnormal.     Comments: She did remember me immediately when I entered memory care and called me over to see her  Psychiatric:        Mood and Affect: Mood normal.     Labs reviewed: Basic Metabolic Panel: Recent Labs    04/11/18 1430 04/23/18 0600  NA 141 143  K 3.8 3.9  CL 108  --   CO2 26  --   GLUCOSE 117*  --   BUN 13 11  CREATININE 0.84 0.6  CALCIUM 9.2  --    Liver Function Tests: Recent Labs    04/11/18 1430  AST 23  ALT 15  ALKPHOS 51  BILITOT 1.8*  PROT 5.7*  ALBUMIN 3.7   Recent Labs    04/11/18 1430  LIPASE 30   No results for input(s): AMMONIA in the last 8760 hours. CBC: Recent Labs    04/11/18 1430 04/16/18 0928  WBC 7.5 7.8  NEUTROABS 4.5 5.3  HGB 11.2* 12.0  HCT 37.3 35.5  MCV 99.5 91  PLT 166 185   Cardiac Enzymes: No results for input(s): CKTOTAL, CKMB, CKMBINDEX, TROPONINI in the last 8760 hours. BNP: Invalid input(s): POCBNP No results found for: HGBA1C Lab Results  Component Value Date   TSH 0.93 04/11/2017   No results found for: VITAMINB12 No results found for: FOLATE No results found for: IRON, TIBC, FERRITIN  Imaging and Procedures obtained prior to SNF admission: Ct Abdomen Pelvis W Contrast  Result Date: 05/05/2018 CLINICAL DATA:  85 year old female with nausea, gastric reflux and upper abdominal pain for 3 weeks. History of bladder cancer post excision. Initial encounter. EXAM: CT ABDOMEN AND PELVIS WITH CONTRAST  TECHNIQUE: Multidetector CT imaging of the abdomen and pelvis was performed using the standard protocol following bolus administration of intravenous contrast. CONTRAST:  85m ISOVUE-300 IOPAMIDOL (ISOVUE-300) INJECTION 61% COMPARISON:  None. FINDINGS: Lower chest: Small to moderate size left-sided pleural effusion. Left base consolidation may represent atelectasis although infiltrate not excluded. Right base very small pleural effusion and basilar atelectasis. Mild cardiomegaly. Mitral and aortic valve calcification. Possible coronary artery calcification. Hepatobiliary: 1 cm low-density structure medial right lobe liver adjacent to the common bile duct may represent a liver cyst or focal projection of common bile duct. No worrisome hepatic lesion. Contracted gallbladder without calcified gallstones. Uniform enhancement of gallbladder wall without pericholecystic inflammation to suggest cholecystitis. Common bile duct prominence may be related to patient's age. No obstructing stone or mass noted. Pancreas: No worrisome pancreatic mass or inflammation. Spleen: No splenic mass or enlargement. Adrenals/Urinary Tract: No adrenal lesion. No obstructing stone or hydronephrosis. Right upper pole 5.5 cm cyst. Bilateral parapelvic cysts. Smaller renal low-density structures too small to characterize statistically likely cysts. Left 2.5 cm bladder diverticulum. Evaluation of urinary bladder limited by lack of contrast. With patient's history, if hematuria is present further investigation may be considered. Stomach/Bowel: Slightly asymmetric circumferential rectal wall thickening. Cannot exclude mass at this level. Sigmoid colon diverticulosis with slightly asymmetric muscular hypertrophy limiting for evaluating for possibly of underlying mass. Although there is slight prominence of surrounding vasculature, no definitive evidence of diverticulitis. Portions of remainder of colon under distended (including areas at the level  of the splenic and hepatic flexure) limiting evaluation for possibly of underlying mass. Small hiatal hernia. Portions of stomach and small bowel under distended without discrete abnormality. Vascular/Lymphatic: Prominent atherosclerotic changes with areas of irregular plaque throughout  the abdominal aorta and aortic branch vessels. Ectatic abdominal aorta and iliac arteries. 2.4 cm maximal transverse dimension of abdominal aorta. 1.7 cm maximal dimension common iliac arteries. Narrowing of branch vessels without large vessel occlusion. Scattered normal size lymph nodes. Reproductive: Adnexa top-normal size without discrete mass. No uterine mass noted. Other: Focal dense breast parenchyma. No free intraperitoneal air, drainable fluid collection or bowel containing hernia. Musculoskeletal: Scoliosis lumbar spine with superimposed degenerative changes. Bilateral hip joint degenerative changes. No osseous destructive lesion. IMPRESSION: 1. Slightly asymmetric circumferential rectal wall thickening. Cannot exclude mass at this level. 2. Sigmoid colon diverticulosis. No definitive evidence of diverticulitis. 3. Asymmetric muscular hypertrophy of the sigmoid colon (which may be related to diverticulosis) and areas of under distension of the hepatic flexure and sigmoid flexure of the colon (which may be related to simple peristalsis) limit evaluation for detection of possibly of underlying mass at these levels. 4. Small hiatal hernia. 5. Small to moderate size left-sided pleural effusion. Left base consolidation may represent atelectasis although infiltrate not excluded. Right base very small pleural effusion and right base atelectasis. 6. Right upper pole 5.5 cm renal cyst. Bilateral parapelvic cysts. No hydronephrosis. 7. 2.5 cm left-sided bladder diverticulum. Evaluation of urinary bladder limited by lack of contrast filled views. With patient's history, if hematuria is present, further investigation may be considered.  8. Aortic Atherosclerosis (ICD10-I70.0). Abdominal aorta ectasia with maximal transverse dimension of 2.4 cm. Prominent atherosclerotic changes aortic branch vessels without large vessel occlusion. 9. Contracted gallbladder without gallstones or CT evidence of pericholecystic inflammation. If gallbladder abnormality were of high clinical concern than ultrasound may be considered. 10. Scoliosis lumbar spine with superimposed degenerative changes. Hip joint degenerative changes. Electronically Signed   By: Genia Del M.D.   On: 05/05/2018 07:49    Assessment/Plan 1. Late onset Alzheimer's disease without behavioral disturbance ([place]) -she is notably worsening cognitively and functionally over the past few years, but now the past few months especially -will check UA c+s due to notable increased frequency seen in memory care already (not sure if pt was taking herself to restroom in AL so staff not aware?) to r/o UTI -if no infection to explain this, will plan to stop diuretic and initiate compression hose; also may stop myrbetriq due to lack of efficacy  2. Hypothyroidism due to acquired atrophy of thyroid -cont current levothyroxine -f/u tsh also Lab Results  Component Value Date   TSH 0.93 04/11/2017   3. Irritable bowel syndrome with both constipation and diarrhea -has been an issue off and on for her--diarrhea tends to occur if she eats too much chocolate or too many fruit cups -has prn imodium in case  4. Paroxysmal atrial fibrillation (HCC) - rate controlled with coreg, cont same -not on anticoagulation at her age and declining function; also avoids asa due to considerable hemorrhoidal bleeding in the past  5. Falls frequently -has been quite often and one of reasons for transfer to more supervised area -no longer using power chair due to dangers -cont manual wheelchair  6. Aortic valve stenosis, etiology of cardiac valve disease unspecified -noted, moderate on last echo, cardiology  opted not to make changes to regimen in reference to this or pleural effusion at this stage; family is not wanting aggressive testing and interventions with mom being 23 yo--goals are comfort-based  Family/ staff Communication: discussed with memory care nurse  Labs/tests ordered:  No new  Does need MOST completed.  Has DNR.  Mrs. Poland does not want hospitalizations.  Wyvonne Carda L. Sade Mehlhoff, D.O. [facility] [address], [place] [address] Cell Phone (Mon-Fri 8am-5pm):  [phone] On Call:  [phone] & follow prompts after 5pm & weekends Office Phone:  [phone] Office Fax:  [phone]

## 2018-11-06 DIAGNOSIS — D649 Anemia, unspecified: Secondary | ICD-10-CM | POA: Diagnosis not present

## 2018-11-06 DIAGNOSIS — R41840 Attention and concentration deficit: Secondary | ICD-10-CM | POA: Diagnosis not present

## 2018-11-06 DIAGNOSIS — E039 Hypothyroidism, unspecified: Secondary | ICD-10-CM | POA: Diagnosis not present

## 2018-11-06 DIAGNOSIS — R278 Other lack of coordination: Secondary | ICD-10-CM | POA: Diagnosis not present

## 2018-11-06 DIAGNOSIS — I6789 Other cerebrovascular disease: Secondary | ICD-10-CM | POA: Diagnosis not present

## 2018-11-06 DIAGNOSIS — R319 Hematuria, unspecified: Secondary | ICD-10-CM | POA: Diagnosis not present

## 2018-11-06 DIAGNOSIS — M6389 Disorders of muscle in diseases classified elsewhere, multiple sites: Secondary | ICD-10-CM | POA: Diagnosis not present

## 2018-11-06 LAB — BASIC METABOLIC PANEL
BUN: 17 (ref 4–21)
Creatinine: 0.7 (ref 0.5–1.1)
Glucose: 117
Potassium: 3.8 (ref 3.4–5.3)
Sodium: 140 (ref 137–147)

## 2018-11-06 LAB — CBC AND DIFFERENTIAL
HCT: 37 (ref 36–46)
Hemoglobin: 12.5 (ref 12.0–16.0)
Platelets: 210 (ref 150–399)
WBC: 7.8

## 2018-11-06 LAB — TSH: TSH: 1.5 (ref 0.41–5.90)

## 2018-11-11 DIAGNOSIS — R278 Other lack of coordination: Secondary | ICD-10-CM | POA: Diagnosis not present

## 2018-11-11 DIAGNOSIS — M6389 Disorders of muscle in diseases classified elsewhere, multiple sites: Secondary | ICD-10-CM | POA: Diagnosis not present

## 2018-11-11 DIAGNOSIS — R41840 Attention and concentration deficit: Secondary | ICD-10-CM | POA: Diagnosis not present

## 2018-11-11 DIAGNOSIS — I6789 Other cerebrovascular disease: Secondary | ICD-10-CM | POA: Diagnosis not present

## 2018-11-12 DIAGNOSIS — R41840 Attention and concentration deficit: Secondary | ICD-10-CM | POA: Diagnosis not present

## 2018-11-12 DIAGNOSIS — I6789 Other cerebrovascular disease: Secondary | ICD-10-CM | POA: Diagnosis not present

## 2018-11-12 DIAGNOSIS — M6389 Disorders of muscle in diseases classified elsewhere, multiple sites: Secondary | ICD-10-CM | POA: Diagnosis not present

## 2018-11-12 DIAGNOSIS — R278 Other lack of coordination: Secondary | ICD-10-CM | POA: Diagnosis not present

## 2018-11-14 DIAGNOSIS — I6789 Other cerebrovascular disease: Secondary | ICD-10-CM | POA: Diagnosis not present

## 2018-11-14 DIAGNOSIS — R41840 Attention and concentration deficit: Secondary | ICD-10-CM | POA: Diagnosis not present

## 2018-11-14 DIAGNOSIS — M6389 Disorders of muscle in diseases classified elsewhere, multiple sites: Secondary | ICD-10-CM | POA: Diagnosis not present

## 2018-11-14 DIAGNOSIS — R278 Other lack of coordination: Secondary | ICD-10-CM | POA: Diagnosis not present

## 2018-11-19 DIAGNOSIS — I6789 Other cerebrovascular disease: Secondary | ICD-10-CM | POA: Diagnosis not present

## 2018-11-19 DIAGNOSIS — M6389 Disorders of muscle in diseases classified elsewhere, multiple sites: Secondary | ICD-10-CM | POA: Diagnosis not present

## 2018-11-19 DIAGNOSIS — R278 Other lack of coordination: Secondary | ICD-10-CM | POA: Diagnosis not present

## 2018-11-19 DIAGNOSIS — R41840 Attention and concentration deficit: Secondary | ICD-10-CM | POA: Diagnosis not present

## 2018-11-21 DIAGNOSIS — R278 Other lack of coordination: Secondary | ICD-10-CM | POA: Diagnosis not present

## 2018-11-21 DIAGNOSIS — I6789 Other cerebrovascular disease: Secondary | ICD-10-CM | POA: Diagnosis not present

## 2018-11-21 DIAGNOSIS — M6389 Disorders of muscle in diseases classified elsewhere, multiple sites: Secondary | ICD-10-CM | POA: Diagnosis not present

## 2018-11-21 DIAGNOSIS — R41840 Attention and concentration deficit: Secondary | ICD-10-CM | POA: Diagnosis not present

## 2018-11-25 DIAGNOSIS — I6789 Other cerebrovascular disease: Secondary | ICD-10-CM | POA: Diagnosis not present

## 2018-11-25 DIAGNOSIS — M6389 Disorders of muscle in diseases classified elsewhere, multiple sites: Secondary | ICD-10-CM | POA: Diagnosis not present

## 2018-11-25 DIAGNOSIS — R41840 Attention and concentration deficit: Secondary | ICD-10-CM | POA: Diagnosis not present

## 2018-11-25 DIAGNOSIS — R278 Other lack of coordination: Secondary | ICD-10-CM | POA: Diagnosis not present

## 2018-11-26 DIAGNOSIS — M6389 Disorders of muscle in diseases classified elsewhere, multiple sites: Secondary | ICD-10-CM | POA: Diagnosis not present

## 2018-11-26 DIAGNOSIS — R41840 Attention and concentration deficit: Secondary | ICD-10-CM | POA: Diagnosis not present

## 2018-11-26 DIAGNOSIS — I6789 Other cerebrovascular disease: Secondary | ICD-10-CM | POA: Diagnosis not present

## 2018-11-26 DIAGNOSIS — R278 Other lack of coordination: Secondary | ICD-10-CM | POA: Diagnosis not present

## 2018-11-27 ENCOUNTER — Encounter: Payer: Self-pay | Admitting: Adult Health

## 2018-11-27 ENCOUNTER — Non-Acute Institutional Stay (SKILLED_NURSING_FACILITY): Payer: Medicare Other | Admitting: Adult Health

## 2018-11-27 DIAGNOSIS — J90 Pleural effusion, not elsewhere classified: Secondary | ICD-10-CM

## 2018-11-27 DIAGNOSIS — I350 Nonrheumatic aortic (valve) stenosis: Secondary | ICD-10-CM

## 2018-11-27 DIAGNOSIS — I10 Essential (primary) hypertension: Secondary | ICD-10-CM

## 2018-11-27 DIAGNOSIS — N3281 Overactive bladder: Secondary | ICD-10-CM

## 2018-11-27 DIAGNOSIS — F0390 Unspecified dementia without behavioral disturbance: Secondary | ICD-10-CM | POA: Insufficient documentation

## 2018-11-27 DIAGNOSIS — R634 Abnormal weight loss: Secondary | ICD-10-CM | POA: Diagnosis not present

## 2018-11-27 DIAGNOSIS — E039 Hypothyroidism, unspecified: Secondary | ICD-10-CM | POA: Diagnosis not present

## 2018-11-27 NOTE — Progress Notes (Signed)
Location:  Occupational psychologist of Service:  SNF (31) Provider:   Cindi Carbon, ANP [place] [phone]   Gayland Curry, DO  Patient Care Team: Gayland Curry, DO as PCP - General (Geriatric Medicine) Minus Breeding, MD as PCP - Cardiology (Cardiology)  Extended Emergency Contact Information Primary Emergency Contact: King,Marian Address: [address] [place] [address] Johnnette Litter of [place] Phone: [phone] Mobile Phone: [phone] Relation: Daughter Secondary Emergency Contact: Christiana Fuchs [place] of [place] Phone: [phone] Mobile Phone: [phone] Relation: Daughter  Code Status:  DNR Goals of care: Advanced Directive information Advanced Directives 11/04/2018  Does Patient Have a Medical Advance Directive? Yes  Type of Paramedic of [place];Out of facility DNR (pink MOST or yellow form)  Does patient want to make changes to medical advance directive? No - Patient declined  Copy of [place] in Chart? Yes - validated most recent copy scanned in chart (See row information)  Would patient like information on creating a medical advance directive? -  Pre-existing out of facility DNR order (yellow form or pink MOST form) Yellow form placed in chart (order not valid for inpatient use)     Chief Complaint  Patient presents with  . Medical Management of Chronic Issues    HPI:  Pt is a 85 y.o. female seen today for medical management of chronic diseases.   She moved to memory care last month due to progressive cognitive and functional losses. She has adjusted well per the staff. MMSE 17/30.   She was reported frequent urination that was bothersome to her and a UA was obtained in April of 2020 which was normal. All routine labs were WNL as well. HCTZ was stopped due to the frequency and she was placed on compression hose. Her frequency persisted. No  edema developed and her BP stayed wnl. She was started back on myrbetriq at 50 mg to see if it would help.  Too soon to tell thus far. She has urinated 3-4 times this morning. No burning or fever. Noted hx of bladder malignancy in 2009 s/p excision.    Pleural effusion: (noted on CXR in Oct of 2019 moderate in size on the left) no reports of pain, sob, low oxygen etc.  She has lost 20 lbs in the past 6 months.  She has had off and on issues with nausea and the nurse reported one episode this morning but she did not vomit only spit up saliva. She now denies any issues. Seen in 2019 by GI with no new recommendations.     Past Medical History:  Diagnosis Date  . Aortic stenosis   . Atrial fibrillation, new onset ([place])   . Bladder tumor   . CARCINOMA, BLADDER, TRANSITIONAL CELL 10/24/2007  . Cerebrovascular disease, unspecified   . Chest tightness    with nausea  . Chronic maxillary sinusitis   . Dizziness and giddiness   . GERD (gastroesophageal reflux disease)   . Heart murmur   . HYPERTENSION 01/07/2007  . HYPOTHYROIDISM 01/07/2007  . IBS 05/03/2008  . MIXED HEARING LOSS BILATERAL 09/28/2009  . Nausea   . OSTEOARTHRITIS, KNEE 04/25/2007  . UNSPECIFIED ARTHROPATHY MULTIPLE SITES 10/26/2009   Past Surgical History:  Procedure Laterality Date  . BELPHAROPTOSIS REPAIR    . BLADDER TUMOR EXCISION    . BUNIONECTOMY     bilateral  . CATARACT EXTRACTION  bilateral  . EYE SURGERY    . TONSILLECTOMY AND ADENOIDECTOMY     x2    Allergies  Allergen Reactions  . Penicillins Diarrhea    Has patient had a PCN reaction causing immediate rash, facial/tongue/throat swelling, SOB or lightheadedness with hypotension: YES Has patient had a PCN reaction causing severe rash involving mucus membranes or skin necrosis:NO Has patient had a PCN reaction that required hospitalization NO Has patient had a PCN reaction occurring within the last 10 years: NO If all of the above answers are "NO", then may  proceed with Cephalosporin use.  . Ace Inhibitors     REACTION: cough  . Aspirin   . Hydrocodone     REACTION: Hallucinations---minor  . Nsaids     Outpatient Encounter Medications as of 11/27/2018  Medication Sig  . acetaminophen (TYLENOL) 325 MG tablet Take 2 tablets (650 mg total) by mouth 3 (three) times Schonberger.  Marland Kitchen atorvastatin (LIPITOR) 20 MG tablet Take 1 tablet (20 mg total) by mouth Efferson.  . capsaicin (ZOSTRIX) 0.025 % cream Apply topically as needed.  . carvedilol (COREG) 12.5 MG tablet Take 0.5 tablets (6.25 mg total) by mouth 2 (two) times Achenbach.  . famotidine (PEPCID) 20 MG tablet TAKE 1 TABLET TWICE [place].  Marland Kitchen Glucosamine-Chondroitin 750-600 MG TABS Take 1 tablet by mouth 3 (three) times Coiner.  . isosorbide dinitrate (ISORDIL) 10 MG tablet Take 1 tablet (10 mg total) by mouth 2 (two) times Prada.  Marland Kitchen lactose free nutrition (BOOST PLUS) LIQD Take 237 mLs by mouth Muccio.  Marland Kitchen levothyroxine (SYNTHROID, LEVOTHROID) 75 MCG tablet Take 0.5 tablets (37.5 mcg total) by mouth Ciani before breakfast.  . Loperamide HCl 2 MG CHEW 1 tablet every 6 hours as needed for diarrhea.  May take as a preventive measure prior to visits out of the facility  . mirabegron ER (MYRBETRIQ) 50 MG TB24 tablet Take 50 mg by mouth Plush.  . Multiple Vitamin (MULTIVITAMIN) tablet Take 1 tablet by mouth Baig.   . ondansetron (ZOFRAN) 8 MG tablet Take 1 tablet (8 mg total) by mouth every 8 (eight) hours as needed for nausea or vomiting.  . Psyllium 400 MG CAPS Take 400 mg by mouth 2 (two) times [place]. WITH 4-6 OZ OF WATER  . [DISCONTINUED] hydrochlorothiazide (HYDRODIURIL) 12.5 MG tablet Take 12.5 mg by mouth Sirek.   . [DISCONTINUED] MYRBETRIQ 25 MG TB24 tablet Take 1 tablet by mouth Katzenstein.   No facility-administered encounter medications on file as of 11/27/2018.     Review of Systems  Constitutional: Negative for activity change, appetite change, chills, diaphoresis, fatigue, fever and unexpected weight  change.  HENT: Negative for congestion.   Respiratory: Negative for cough, shortness of breath and wheezing.   Cardiovascular: Negative for chest pain, palpitations and leg swelling.  Gastrointestinal: Negative for abdominal distention, abdominal pain, constipation, diarrhea, nausea, rectal pain and vomiting.  Genitourinary: Positive for frequency. Negative for difficulty urinating, dysuria, flank pain and hematuria.  Musculoskeletal: Positive for gait problem. Negative for arthralgias, back pain, joint swelling and myalgias.  Neurological: Negative for dizziness, tremors, seizures, syncope, facial asymmetry, speech difficulty, weakness, light-headedness, numbness and headaches.  Psychiatric/Behavioral: Positive for confusion. Negative for agitation and behavioral problems.    Immunization History  Administered Date(s) Administered  . Influenza Split 04/08/2013  . Influenza Whole 04/08/2010  . Influenza,inj,Quad PF,6+ Mos 04/29/2018  . Influenza-Unspecified 05/09/2014, 05/03/2015, 05/03/2016  . Pneumococcal Conjugate-13 08/03/2013  . Pneumococcal Polysaccharide-23 03/09/2005  . Td 11/13/2018  . Zoster 05/12/2012  .  Zoster Recombinat (Shingrix) 08/02/2017, 04/24/2018   Pertinent  Health Maintenance Due  Topic Date Due  . DEXA SCAN  03/31/1986  . INFLUENZA VACCINE  02/07/2019  . PNA vac Low Risk Adult  Completed   Fall Risk  06/04/2018 05/26/2018 04/23/2018 12/17/2014 08/03/2013  Falls in the past year? 0 1 No Yes Yes  Comment - Emmi Telephone Survey: data to providers prior to load - - -  Number falls in past yr: 0 1 - 1 2 or more  Comment - Emmi Telephone Survey Actual Response = 44 - - -  Injury with Fall? 0 0 - No -  Risk Factor Category  - - - - High Fall Risk  Risk for fall due to : - - - - Impaired balance/gait   Functional Status Survey:    Vitals:   11/27/18 1525  BP: 122/62  Pulse: 82  Resp: 16  Temp: (!) 97.5 F (36.4 C)  SpO2: 96%  Weight: 130 lb (59 kg)    Body mass index is 22.31 kg/m. Physical Exam Vitals signs and nursing note reviewed.  Constitutional:      General: She is not in acute distress.    Appearance: She is not diaphoretic.  HENT:     Head: Normocephalic and atraumatic.  Neck:     Vascular: No carotid bruit or JVD.  Cardiovascular:     Rate and Rhythm: Normal rate and regular rhythm.     Heart sounds: Murmur present.  Pulmonary:     Effort: Pulmonary effort is normal. No respiratory distress.     Breath sounds: Normal breath sounds. No wheezing.  Abdominal:     General: Abdomen is flat. Bowel sounds are normal. There is no distension.     Palpations: Abdomen is soft.     Tenderness: There is no abdominal tenderness.  Musculoskeletal:     Right lower leg: No edema.     Left lower leg: No edema.  Lymphadenopathy:     Cervical: No cervical adenopathy.  Skin:    General: Skin is warm and dry.  Neurological:     General: No focal deficit present.     Mental Status: She is alert. Mental status is at baseline.  Psychiatric:        Mood and Affect: Mood normal.     Labs reviewed: Recent Labs    04/11/18 1430 04/23/18 0600 11/06/18  NA 141 143 140  K 3.8 3.9 3.8  CL 108  --   --   CO2 26  --   --   GLUCOSE 117*  --   --   BUN 13 11 17   CREATININE 0.84 0.6 0.7  CALCIUM 9.2  --   --    Recent Labs    04/11/18 1430  AST 23  ALT 15  ALKPHOS 51  BILITOT 1.8*  PROT 5.7*  ALBUMIN 3.7   Recent Labs    04/11/18 1430 04/16/18 0928 11/06/18  WBC 7.5 7.8 7.8  NEUTROABS 4.5 5.3  --   HGB 11.2* 12.0 12.5  HCT 37.3 35.5 37  MCV 99.5 91  --   PLT 166 185 210   Lab Results  Component Value Date   TSH 1.50 11/06/2018   No results found for: HGBA1C Lab Results  Component Value Date   CHOL 96 03/15/2016   HDL 51 03/15/2016   LDLCALC 38 03/15/2016   TRIG 37 03/15/2016   CHOLHDL 1.9 03/15/2016    Significant Diagnostic Results in last 30  days:  No results found.  Assessment/Plan 1. Dementia  without behavioral disturbance, unspecified dementia type ([place]) Progressive decline in cognition and physical function c/w the disease, although more rapid over the past 6 months Goals of care are comfort based.  Continue supportive care in the skilled environment.  2. Weight loss 20 lbs in 6 months, likely due to progressive dementia but noted hx of bladder cancer and colon wall thickening noted on CT scan in 2019  No further work up due to goals of care.   3. Nonrheumatic aortic valve stenosis Followed by cards  4. Pleural effusion Doing well with no symptoms Monitor for cp, sob, doe, or weight gain  5. Acquired hypothyroidism Lab Results  Component Value Date   TSH 1.50 11/06/2018  Continue Synthroid 37.54mcg qd   6. OAB (overactive bladder) No improvement thus far. Continue myrbetriq at this time. Follow up next month.   7. Essential hypertension Controlled and doing well of hctz    Family/ staff Communication: staff to communicate with family about obtaining a most form. I am happy to go over this with them if needed.   Labs/tests ordered:  NA

## 2018-11-28 DIAGNOSIS — M6389 Disorders of muscle in diseases classified elsewhere, multiple sites: Secondary | ICD-10-CM | POA: Diagnosis not present

## 2018-11-28 DIAGNOSIS — R41840 Attention and concentration deficit: Secondary | ICD-10-CM | POA: Diagnosis not present

## 2018-11-28 DIAGNOSIS — R278 Other lack of coordination: Secondary | ICD-10-CM | POA: Diagnosis not present

## 2018-11-28 DIAGNOSIS — I6789 Other cerebrovascular disease: Secondary | ICD-10-CM | POA: Diagnosis not present

## 2018-12-01 DIAGNOSIS — I6789 Other cerebrovascular disease: Secondary | ICD-10-CM | POA: Diagnosis not present

## 2018-12-01 DIAGNOSIS — M6389 Disorders of muscle in diseases classified elsewhere, multiple sites: Secondary | ICD-10-CM | POA: Diagnosis not present

## 2018-12-01 DIAGNOSIS — R41840 Attention and concentration deficit: Secondary | ICD-10-CM | POA: Diagnosis not present

## 2018-12-01 DIAGNOSIS — R278 Other lack of coordination: Secondary | ICD-10-CM | POA: Diagnosis not present

## 2018-12-02 DIAGNOSIS — M6389 Disorders of muscle in diseases classified elsewhere, multiple sites: Secondary | ICD-10-CM | POA: Diagnosis not present

## 2018-12-02 DIAGNOSIS — I6789 Other cerebrovascular disease: Secondary | ICD-10-CM | POA: Diagnosis not present

## 2018-12-02 DIAGNOSIS — R41840 Attention and concentration deficit: Secondary | ICD-10-CM | POA: Diagnosis not present

## 2018-12-02 DIAGNOSIS — R278 Other lack of coordination: Secondary | ICD-10-CM | POA: Diagnosis not present

## 2018-12-05 DIAGNOSIS — R41840 Attention and concentration deficit: Secondary | ICD-10-CM | POA: Diagnosis not present

## 2018-12-05 DIAGNOSIS — R278 Other lack of coordination: Secondary | ICD-10-CM | POA: Diagnosis not present

## 2018-12-05 DIAGNOSIS — I6789 Other cerebrovascular disease: Secondary | ICD-10-CM | POA: Diagnosis not present

## 2018-12-05 DIAGNOSIS — M6389 Disorders of muscle in diseases classified elsewhere, multiple sites: Secondary | ICD-10-CM | POA: Diagnosis not present

## 2018-12-08 DIAGNOSIS — M6389 Disorders of muscle in diseases classified elsewhere, multiple sites: Secondary | ICD-10-CM | POA: Diagnosis not present

## 2018-12-08 DIAGNOSIS — R278 Other lack of coordination: Secondary | ICD-10-CM | POA: Diagnosis not present

## 2018-12-08 DIAGNOSIS — I6789 Other cerebrovascular disease: Secondary | ICD-10-CM | POA: Diagnosis not present

## 2018-12-08 DIAGNOSIS — R41840 Attention and concentration deficit: Secondary | ICD-10-CM | POA: Diagnosis not present

## 2018-12-10 DIAGNOSIS — M6389 Disorders of muscle in diseases classified elsewhere, multiple sites: Secondary | ICD-10-CM | POA: Diagnosis not present

## 2018-12-10 DIAGNOSIS — I6789 Other cerebrovascular disease: Secondary | ICD-10-CM | POA: Diagnosis not present

## 2018-12-10 DIAGNOSIS — R41840 Attention and concentration deficit: Secondary | ICD-10-CM | POA: Diagnosis not present

## 2018-12-10 DIAGNOSIS — R278 Other lack of coordination: Secondary | ICD-10-CM | POA: Diagnosis not present

## 2018-12-11 DIAGNOSIS — R41840 Attention and concentration deficit: Secondary | ICD-10-CM | POA: Diagnosis not present

## 2018-12-11 DIAGNOSIS — I6789 Other cerebrovascular disease: Secondary | ICD-10-CM | POA: Diagnosis not present

## 2018-12-11 DIAGNOSIS — R278 Other lack of coordination: Secondary | ICD-10-CM | POA: Diagnosis not present

## 2018-12-11 DIAGNOSIS — M6389 Disorders of muscle in diseases classified elsewhere, multiple sites: Secondary | ICD-10-CM | POA: Diagnosis not present

## 2019-01-07 DEATH — deceased

## 2019-02-11 ENCOUNTER — Encounter: Payer: Self-pay | Admitting: Internal Medicine

## 2019-12-23 IMAGING — DX DG CHEST 1V PORT
1 series · 1 of 1 positions shown · non-contrast
Comparison: 04/26/2016

CLINICAL DATA: Chest pain

EXAM:
PORTABLE CHEST 1 VIEW

[chest ap]
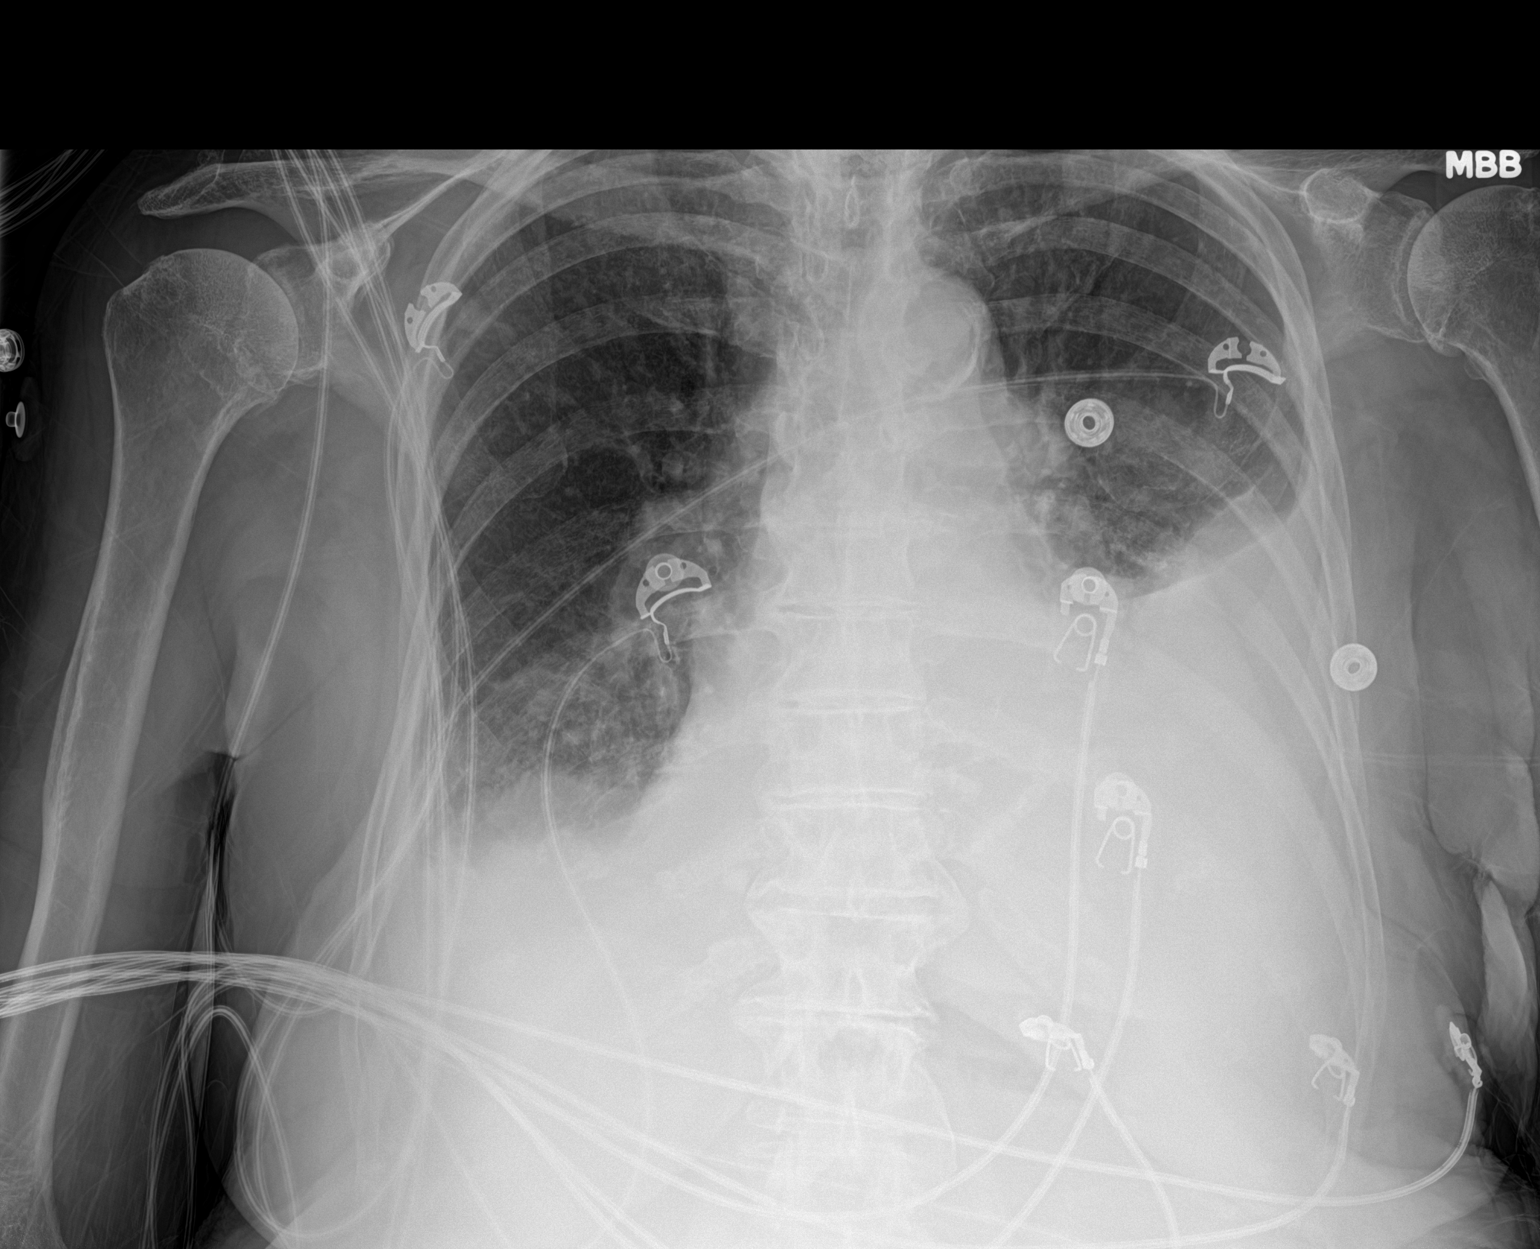

[1 of 1 positions shown; findings below may reference images not displayed]

FINDINGS: Moderate left and small right pleural effusions obscuring much of
the left lung. The adjacent lung is opacified, which could be
atelectasis. There is no reported symptoms of infection. No Kerley
lines where seen. Chronic cardiomegaly. Mitral annular
calcification.
IMPRESSION: Cardiomegaly with moderate left and small right pleural effusions
partially obscuring the lung.

## 2020-01-13 IMAGING — CT CT ABD-PELV W/ CM
2 of 5 series · 13 of 46 positions shown, 15 images · IV contrast (iopamidol)
Comparison: None.

CLINICAL DATA: [AGE] female with nausea, gastric reflux and
upper abdominal pain for 3 weeks. History of bladder cancer post
excision. Initial encounter.

EXAM:
CT ABDOMEN AND PELVIS WITH CONTRAST
TECHNIQUE: Multidetector CT imaging of the abdomen and pelvis was performed
using the standard protocol following bolus administration of
intravenous contrast.
CONTRAST:  75mL VKDNM6-9CC IOPAMIDOL (VKDNM6-9CC) INJECTION 61%

[Series 2: abd pelvis 5.00 br40 s3 ax · axial · 0.73mm/px · z∈[+1280,+1665]mm · 10 of 87 slices shown, 12 images]
[im 5/87  soft-tissue]
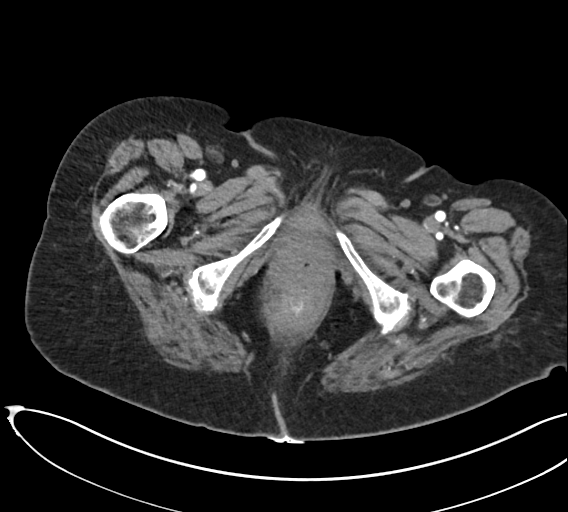
[im 5/87  bone]
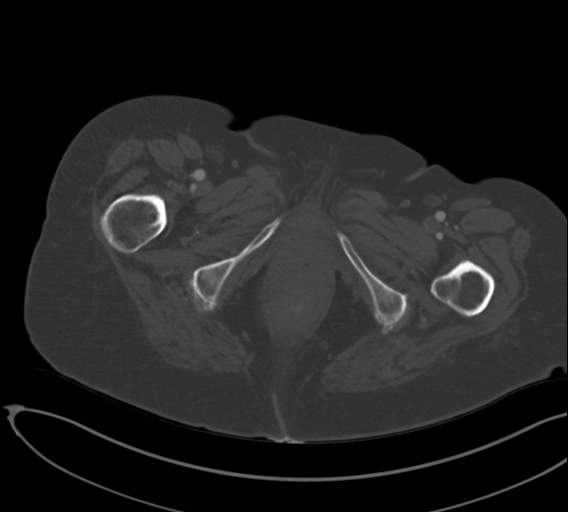
[im 13/87  soft-tissue]
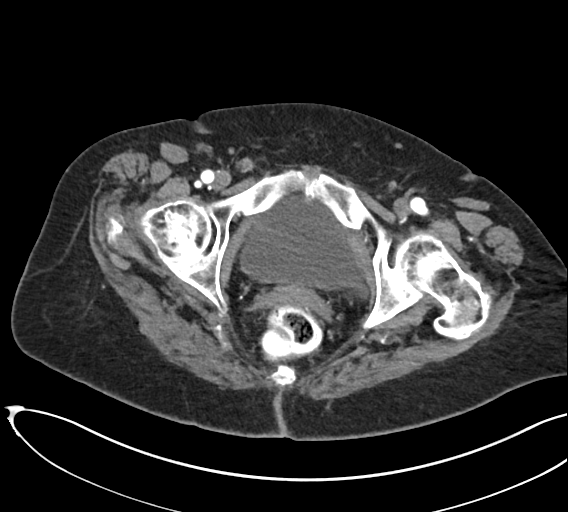
[im 22/87  soft-tissue]
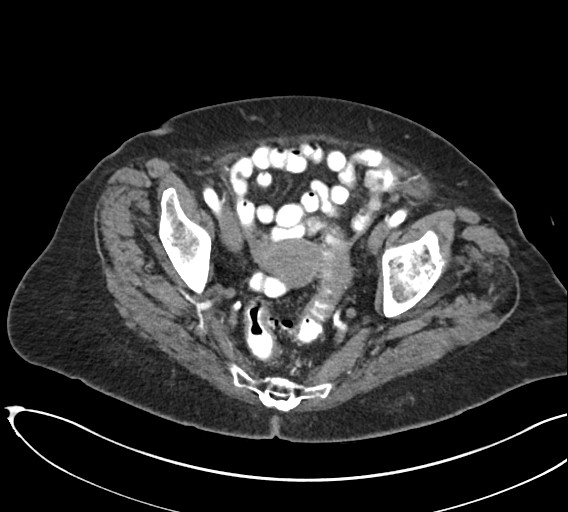
[im 31/87  soft-tissue]
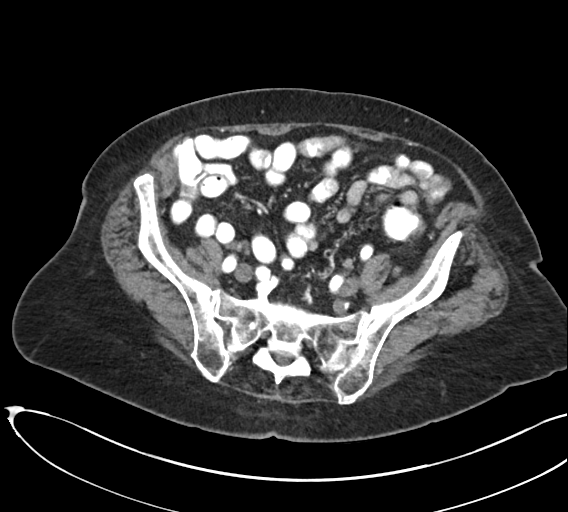
[im 39/87  soft-tissue]
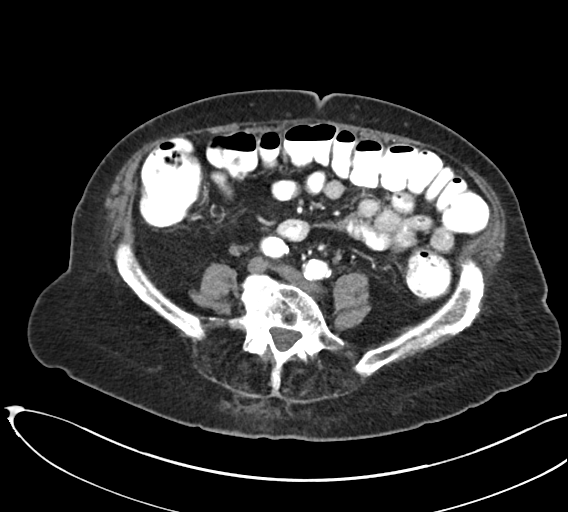
[im 48/87  soft-tissue]
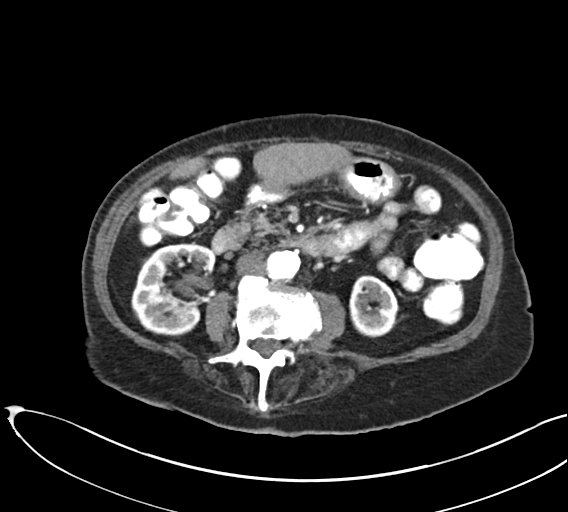
[im 56/87  soft-tissue]
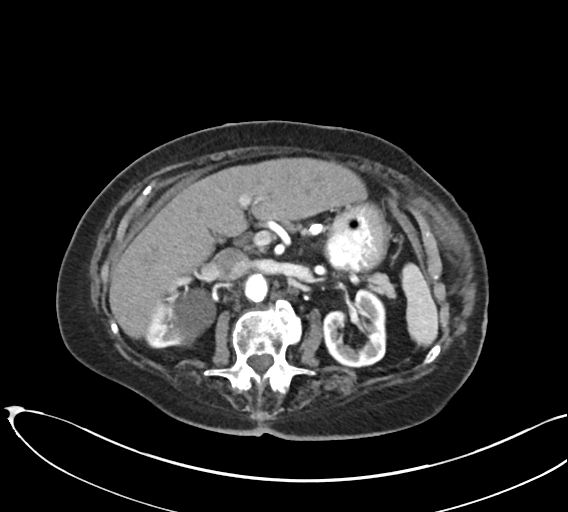
[im 65/87  soft-tissue]
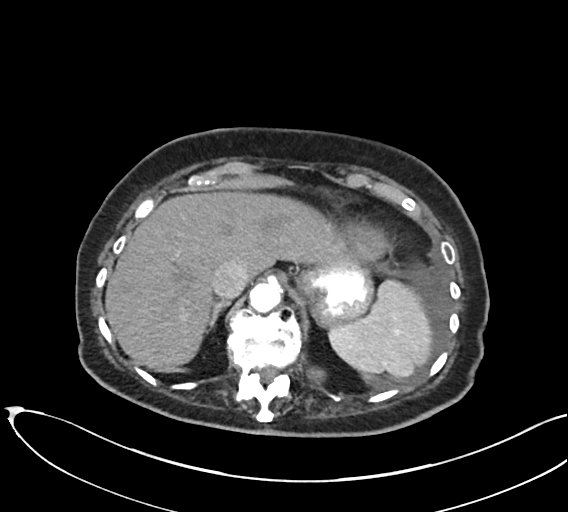
[im 74/87  soft-tissue]
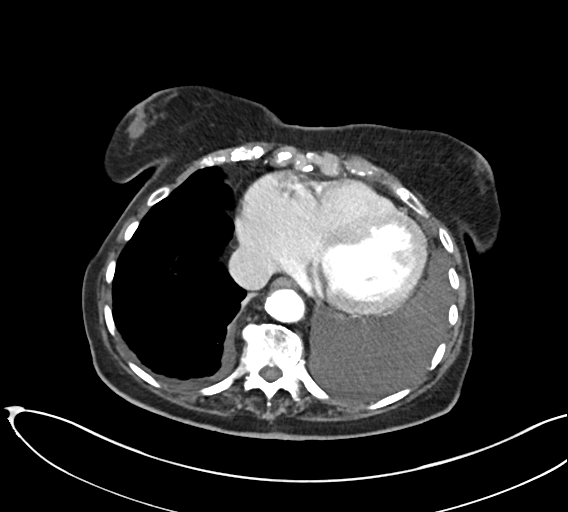
[im 74/87  bone]
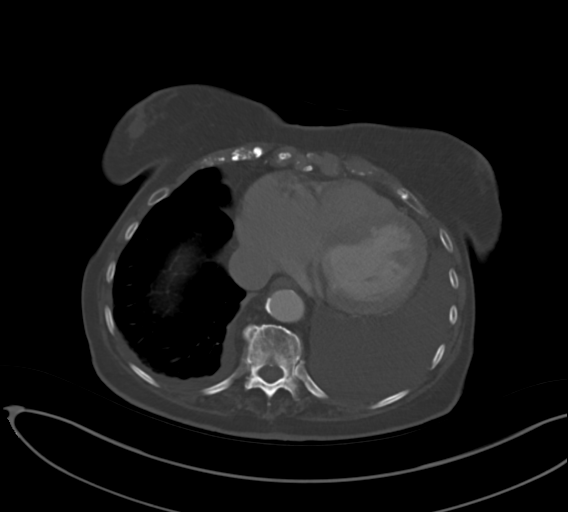
[im 82/87  soft-tissue]
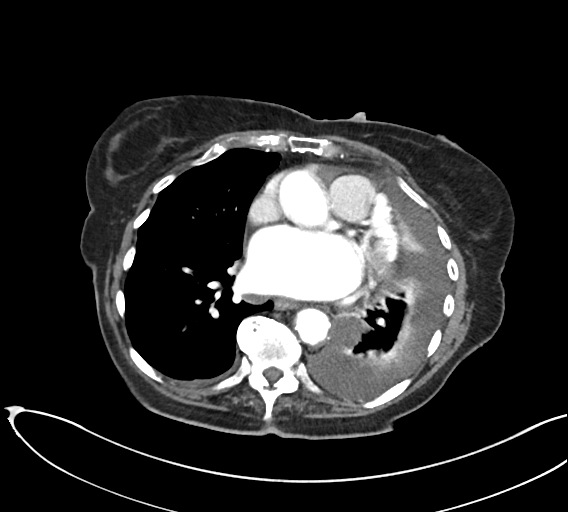

[Series 6: abd pelvis 2.00 br40 s3 cor · coronal · 0.79mm/px · 3 of 149 slices shown]
[im 50/149  soft-tissue]
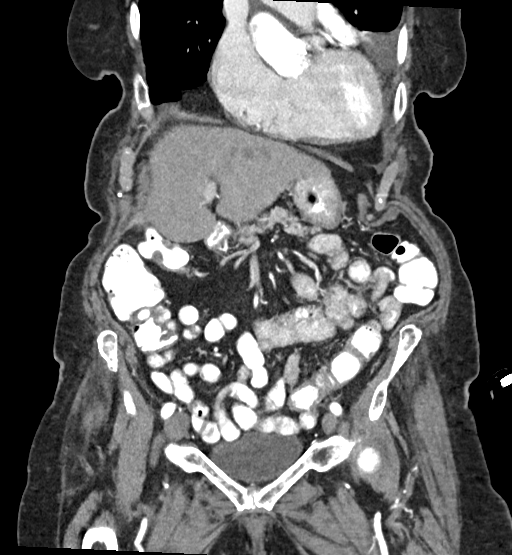
[im 66/149  soft-tissue]
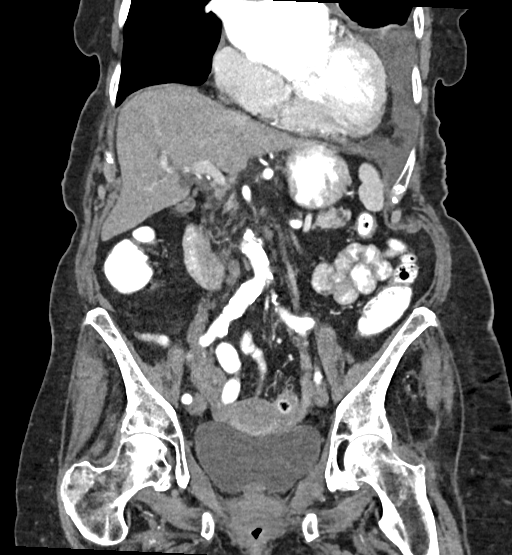
[im 83/149  soft-tissue]
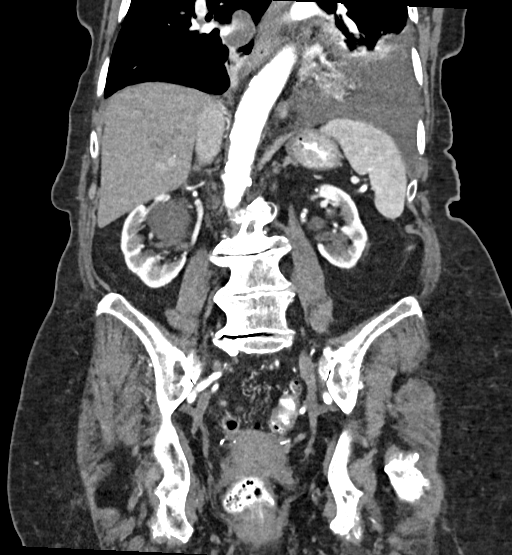

[13 of 46 positions shown; findings below may reference images not displayed]

FINDINGS: Lower chest: Small to moderate size left-sided pleural effusion.
Left base consolidation may represent atelectasis although
infiltrate not excluded. Right base very small pleural effusion and
basilar atelectasis. Mild cardiomegaly. Mitral and aortic valve
calcification. Possible coronary artery calcification.

Hepatobiliary: 1 cm low-density structure medial right lobe liver
adjacent to the common bile duct may represent a liver cyst or focal
projection of common bile duct. No worrisome hepatic lesion.
Contracted gallbladder without calcified gallstones. Uniform
enhancement of gallbladder wall without pericholecystic inflammation
to suggest cholecystitis. Common bile duct prominence may be related
to patient's age. No obstructing stone or mass noted.

Pancreas: No worrisome pancreatic mass or inflammation.

Spleen: No splenic mass or enlargement.

Adrenals/Urinary Tract: No adrenal lesion.

No obstructing stone or hydronephrosis. Right upper pole 5.5 cm
cyst. Bilateral parapelvic cysts. Smaller renal low-density
structures too small to characterize statistically likely cysts.

Left 2.5 cm bladder diverticulum. Evaluation of urinary bladder
limited by lack of contrast. With patient's history, if hematuria is
present further investigation may be considered.

Stomach/Bowel: Slightly asymmetric circumferential rectal wall
thickening. Cannot exclude mass at this level. Sigmoid colon
diverticulosis with slightly asymmetric muscular hypertrophy
limiting for evaluating for possibly of underlying mass. Although
there is slight prominence of surrounding vasculature, no definitive
evidence of diverticulitis. Portions of remainder of colon under
distended (including areas at the level of the splenic and hepatic
flexure) limiting evaluation for possibly of underlying mass.

Small hiatal hernia. Portions of stomach and small bowel under
distended without discrete abnormality.

Vascular/Lymphatic: Prominent atherosclerotic changes with areas of
irregular plaque throughout the abdominal aorta and aortic branch
vessels. Ectatic abdominal aorta and iliac arteries. 2.4 cm maximal
transverse dimension of abdominal aorta. 1.7 cm maximal dimension
common iliac arteries. Narrowing of branch vessels without large
vessel occlusion.

Scattered normal size lymph nodes.

Reproductive: Adnexa top-normal size without discrete mass. No
uterine mass noted.

Other: Focal dense breast parenchyma. No free intraperitoneal air,
drainable fluid collection or bowel containing hernia.

Musculoskeletal: Scoliosis lumbar spine with superimposed
degenerative changes. Bilateral hip joint degenerative changes. No
osseous destructive lesion.
IMPRESSION: 1. Slightly asymmetric circumferential rectal wall thickening.
Cannot exclude mass at this level.
2. Sigmoid colon diverticulosis. No definitive evidence of
diverticulitis.
3. Asymmetric muscular hypertrophy of the sigmoid colon (which may
be related to diverticulosis) and areas of under distension of the
hepatic flexure and sigmoid flexure of the colon (which may be
related to simple peristalsis) limit evaluation for detection of
possibly of underlying mass at these levels.
4. Small hiatal hernia.
5. Small to moderate size left-sided pleural effusion. Left base
consolidation may represent atelectasis although infiltrate not
excluded. Right base very small pleural effusion and right base
atelectasis.
6. Right upper pole 5.5 cm renal cyst. Bilateral parapelvic cysts.
No hydronephrosis.
7. 2.5 cm left-sided bladder diverticulum. Evaluation of urinary
bladder limited by lack of contrast filled views. With patient's
history, if hematuria is present, further investigation may be
considered.
8. Aortic Atherosclerosis ([id number]-J08.8). Abdominal aorta ectasia
with maximal transverse dimension of 2.4 cm. Prominent
atherosclerotic changes aortic branch vessels without large vessel
occlusion.
9. Contracted gallbladder without gallstones or CT evidence of
pericholecystic inflammation. If gallbladder abnormality were of
high clinical concern than ultrasound may be considered.
10. Scoliosis lumbar spine with superimposed degenerative changes.
Hip joint degenerative changes.
